# Patient Record
Sex: Female | Born: 1980
Health system: Southern US, Community
[De-identification: ages and names within clinical notes are randomized; demographics above are authoritative.]

## PROBLEM LIST (undated history)

## (undated) DIAGNOSIS — F419 Anxiety disorder, unspecified: Secondary | ICD-10-CM

## (undated) DIAGNOSIS — K589 Irritable bowel syndrome without diarrhea: Secondary | ICD-10-CM

## (undated) DIAGNOSIS — Z8489 Family history of other specified conditions: Secondary | ICD-10-CM

## (undated) DIAGNOSIS — M1990 Unspecified osteoarthritis, unspecified site: Secondary | ICD-10-CM

## (undated) DIAGNOSIS — R112 Nausea with vomiting, unspecified: Secondary | ICD-10-CM

## (undated) DIAGNOSIS — Z9889 Other specified postprocedural states: Secondary | ICD-10-CM

## (undated) DIAGNOSIS — R002 Palpitations: Secondary | ICD-10-CM

## (undated) HISTORY — PX: WISDOM TOOTH EXTRACTION: SHX21

## (undated) HISTORY — PX: KNEE ARTHROSCOPY W/ ACL RECONSTRUCTION: SHX1858

## (undated) HISTORY — PX: TONSILLECTOMY: SUR1361

## (undated) HISTORY — PX: ADENOIDECTOMY: SUR15

## (undated) HISTORY — PX: KNEE ARTHROSCOPY: SHX127

---

## 2002-08-16 ENCOUNTER — Other Ambulatory Visit: Admission: RE | Admit: 2002-08-16 | Discharge: 2002-08-16 | Payer: Self-pay | Admitting: Gynecology

## 2003-09-11 ENCOUNTER — Other Ambulatory Visit: Admission: RE | Admit: 2003-09-11 | Discharge: 2003-09-11 | Payer: Self-pay | Admitting: Gynecology

## 2004-09-11 ENCOUNTER — Other Ambulatory Visit: Admission: RE | Admit: 2004-09-11 | Discharge: 2004-09-11 | Payer: Self-pay | Admitting: Gynecology

## 2005-09-24 ENCOUNTER — Other Ambulatory Visit: Admission: RE | Admit: 2005-09-24 | Discharge: 2005-09-24 | Payer: Self-pay | Admitting: Gynecology

## 2006-10-06 ENCOUNTER — Other Ambulatory Visit: Admission: RE | Admit: 2006-10-06 | Discharge: 2006-10-06 | Payer: Self-pay | Admitting: Family Medicine

## 2007-11-07 ENCOUNTER — Other Ambulatory Visit: Admission: RE | Admit: 2007-11-07 | Discharge: 2007-11-07 | Payer: Self-pay | Admitting: Family Medicine

## 2008-11-11 ENCOUNTER — Other Ambulatory Visit: Admission: RE | Admit: 2008-11-11 | Discharge: 2008-11-11 | Payer: Self-pay | Admitting: Family Medicine

## 2010-01-15 ENCOUNTER — Other Ambulatory Visit: Admission: RE | Admit: 2010-01-15 | Discharge: 2010-01-15 | Payer: Self-pay | Admitting: Family Medicine

## 2013-09-28 ENCOUNTER — Ambulatory Visit (HOSPITAL_COMMUNITY)
Admission: RE | Admit: 2013-09-28 | Discharge: 2013-09-28 | Disposition: A | Discharge status: Home / Self Care | Payer: 59 | Source: Ambulatory Visit | Attending: Specialist | Admitting: Specialist

## 2013-09-28 ENCOUNTER — Other Ambulatory Visit (HOSPITAL_COMMUNITY): Payer: Self-pay | Admitting: Specialist

## 2013-09-28 DIAGNOSIS — M1711 Unilateral primary osteoarthritis, right knee: Secondary | ICD-10-CM

## 2013-09-28 DIAGNOSIS — M1710 Unilateral primary osteoarthritis, unspecified knee: Secondary | ICD-10-CM | POA: Insufficient documentation

## 2014-01-10 ENCOUNTER — Emergency Department (HOSPITAL_COMMUNITY)
Admission: EM | Admit: 2014-01-10 | Discharge: 2014-01-10 | Disposition: A | Discharge status: Home / Self Care | Payer: PRIVATE HEALTH INSURANCE | Attending: Emergency Medicine | Admitting: Emergency Medicine

## 2014-01-10 ENCOUNTER — Encounter (HOSPITAL_COMMUNITY): Payer: Self-pay | Admitting: *Deleted

## 2014-01-10 DIAGNOSIS — S01511A Laceration without foreign body of lip, initial encounter: Secondary | ICD-10-CM | POA: Diagnosis present

## 2014-01-10 DIAGNOSIS — Y9289 Other specified places as the place of occurrence of the external cause: Secondary | ICD-10-CM | POA: Diagnosis not present

## 2014-01-10 DIAGNOSIS — Z8659 Personal history of other mental and behavioral disorders: Secondary | ICD-10-CM | POA: Insufficient documentation

## 2014-01-10 DIAGNOSIS — Y998 Other external cause status: Secondary | ICD-10-CM | POA: Insufficient documentation

## 2014-01-10 DIAGNOSIS — Z8719 Personal history of other diseases of the digestive system: Secondary | ICD-10-CM | POA: Diagnosis not present

## 2014-01-10 DIAGNOSIS — Z8739 Personal history of other diseases of the musculoskeletal system and connective tissue: Secondary | ICD-10-CM | POA: Insufficient documentation

## 2014-01-10 DIAGNOSIS — Y9389 Activity, other specified: Secondary | ICD-10-CM | POA: Insufficient documentation

## 2014-01-10 DIAGNOSIS — S01531A Puncture wound without foreign body of lip, initial encounter: Secondary | ICD-10-CM | POA: Insufficient documentation

## 2014-01-10 HISTORY — DX: Unspecified osteoarthritis, unspecified site: M19.90

## 2014-01-10 HISTORY — DX: Irritable bowel syndrome, unspecified: K58.9

## 2014-01-10 HISTORY — DX: Anxiety disorder, unspecified: F41.9

## 2014-01-10 MED ORDER — LIDOCAINE-EPINEPHRINE-TETRACAINE (LET) SOLUTION
3.0000 mL | Freq: Once | NASAL | Status: AC
  Administered 2014-01-10: 3 mL via TOPICAL
  Filled 2014-01-10: qty 3

## 2014-01-10 MED ORDER — HYDROCODONE-ACETAMINOPHEN 5-325 MG PO TABS: 1.0000 | ORAL_TABLET | Freq: Four times a day (QID) | ORAL | Status: DC | PRN
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
## 2014-01-10 NOTE — ED Notes (Signed)
Pt tolerated procedure without difficulty; wound edges approximated; sutures intact; minimal swelling noted; pt discharged home with boyfriend

## 2014-01-10 NOTE — ED Notes (Signed)
Dr Gwendolyn GrantWalden in with patient suturing wound; pt boyfriend at bedside; pt tolerating procedure without difficulty
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
## 2014-01-10 NOTE — Discharge Instructions (Signed)
Mouth Laceration °A mouth laceration is a cut inside the mouth. °TREATMENT  °Because of all the bacteria in the mouth, lacerations are usually not stitched (sutured) unless the wound is gaping open. Sometimes, a couple sutures may be placed just to hold the edges of the wound together and to speed healing. Over the next 1 to 2 days, you will see that the wound edges appear gray in color. The edges may appear ragged and slightly spread apart. Because of all the normal bacteria in the mouth, these wounds are contaminated, but this is not an infection that needs antibiotics. Most wounds heal with no problems despite their appearance. °HOME CARE INSTRUCTIONS  °· Rinse your mouth with a warm, saltwater wash 4 to 6 times per day, or as your caregiver instructs. °· Continue oral hygiene and gentle tooth brushing as normal, if possible. °· Do not eat or drink hot food or beverages while your mouth is still numb. °· Eat a bland diet to avoid irritation from acidic foods. °· Only take over-the-counter or prescription medicines for pain, discomfort, or fever as directed by your caregiver. °· Follow up with your caregiver as instructed. You may need to see your caregiver for a wound check in 48 to 72 hours to make sure your wound is healing. °· If your laceration was sutured, do not play with the sutures or knots with your tongue. If you do this, they will gradually loosen and may become untied. °You may need a tetanus shot if: °· You cannot remember when you had your last tetanus shot. °· You have never had a tetanus shot. °If you get a tetanus shot, your arm may swell, get red, and feel warm to the touch. This is common and not a problem. If you need a tetanus shot and you choose not to have one, there is a rare chance of getting tetanus. Sickness from tetanus can be serious. °SEEK MEDICAL CARE IF:  °· You develop swelling or increasing pain in the wound or in other parts of your face. °· You have a fever. °· You develop  swollen, tender glands in the throat. °· You notice the wound edges do not stay together after your sutures have been removed. °· You see pus coming from the wound. Some drainage in the mouth is normal. °MAKE SURE YOU:  °· Understand these instructions. °· Will watch your condition. °· Will get help right away if you are not doing well or get worse. °Document Released: 02/15/2005 Document Revised: 05/10/2011 Document Reviewed: 08/20/2010 °ExitCare® Patient Information ©2015 [employer]. This information is not intended to replace advice given to you by your health care provider. Make sure you discuss any questions you have with your health care provider. ° °

## 2015-03-19 DIAGNOSIS — N898 Other specified noninflammatory disorders of vagina: Secondary | ICD-10-CM | POA: Diagnosis not present

## 2015-03-19 DIAGNOSIS — F410 Panic disorder [episodic paroxysmal anxiety] without agoraphobia: Secondary | ICD-10-CM | POA: Diagnosis not present

## 2015-03-19 DIAGNOSIS — Z79899 Other long term (current) drug therapy: Secondary | ICD-10-CM | POA: Diagnosis not present

## 2015-03-19 DIAGNOSIS — Z3041 Encounter for surveillance of contraceptive pills: Secondary | ICD-10-CM | POA: Diagnosis not present

## 2015-04-03 DIAGNOSIS — H5211 Myopia, right eye: Secondary | ICD-10-CM | POA: Diagnosis not present

## 2015-04-03 DIAGNOSIS — H52221 Regular astigmatism, right eye: Secondary | ICD-10-CM | POA: Diagnosis not present

## 2015-05-14 DIAGNOSIS — M1711 Unilateral primary osteoarthritis, right knee: Secondary | ICD-10-CM | POA: Diagnosis not present

## 2015-05-21 DIAGNOSIS — M1711 Unilateral primary osteoarthritis, right knee: Secondary | ICD-10-CM | POA: Diagnosis not present

## 2015-05-28 DIAGNOSIS — M1711 Unilateral primary osteoarthritis, right knee: Secondary | ICD-10-CM | POA: Diagnosis not present

## 2015-07-29 DIAGNOSIS — N3000 Acute cystitis without hematuria: Secondary | ICD-10-CM | POA: Diagnosis not present

## 2016-02-17 DIAGNOSIS — N898 Other specified noninflammatory disorders of vagina: Secondary | ICD-10-CM | POA: Diagnosis not present

## 2016-03-10 DIAGNOSIS — M1711 Unilateral primary osteoarthritis, right knee: Secondary | ICD-10-CM | POA: Diagnosis not present

## 2016-03-19 DIAGNOSIS — Z0000 Encounter for general adult medical examination without abnormal findings: Secondary | ICD-10-CM | POA: Diagnosis not present

## 2016-03-23 DIAGNOSIS — Z0000 Encounter for general adult medical examination without abnormal findings: Secondary | ICD-10-CM | POA: Diagnosis not present

## 2016-03-23 DIAGNOSIS — M1711 Unilateral primary osteoarthritis, right knee: Secondary | ICD-10-CM | POA: Diagnosis not present

## 2016-03-31 DIAGNOSIS — M1711 Unilateral primary osteoarthritis, right knee: Secondary | ICD-10-CM | POA: Diagnosis not present

## 2016-04-08 DIAGNOSIS — H52221 Regular astigmatism, right eye: Secondary | ICD-10-CM | POA: Diagnosis not present

## 2016-04-08 DIAGNOSIS — H5211 Myopia, right eye: Secondary | ICD-10-CM | POA: Diagnosis not present

## 2016-12-22 DIAGNOSIS — M1711 Unilateral primary osteoarthritis, right knee: Secondary | ICD-10-CM | POA: Diagnosis not present

## 2016-12-28 DIAGNOSIS — M1711 Unilateral primary osteoarthritis, right knee: Secondary | ICD-10-CM | POA: Diagnosis not present

## 2017-01-05 DIAGNOSIS — M1711 Unilateral primary osteoarthritis, right knee: Secondary | ICD-10-CM | POA: Diagnosis not present

## 2017-04-20 NOTE — Progress Notes (Signed)
[facility] [address] [place], [place] [address] [phone]  Date:  04/21/2017   Name:  April Graham   DOB:  10-25-1980   MRN:  [id number]  PCP:  Cheron Schaumann., MD    Chief Complaint: Establish Care (Pt here to est care. )   History of Present Illness:  April Graham is a 37 y.o. 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  Her parents do have a lot of chronic illness  Her father was dx with colon cancer at approx age 75.   In her free time she enjoys sports and traveling She likes to watch college football in the fall  She sees Dr. Thomasena Edis for her right knee issues  She needs a referral so she can continue to see him   [facility] is her eye doctor She is still on her OCP They are thinking about kids but have not decided for sure yet.   She has a DDS She does not see GYN  She is fasting today so we will get some labs for her She has been feeling pretty tired recently- we can check her thyroid and vitamin D for her, iron  She is taking phentermine through a weight loss clinic,they are also giving her B12 shots  She uses ativan on rare occasion for anxiety- this has worked well for her. She tried some other medications   Last pap a couple of years ago she thinks Would like a refill of her OCP  Relates a history of "fibrous cysts" in her breasts, and has had a mammogram in the past. Never any concern of cancer There are no active  problems to display for this patient.   Past Medical History:  Diagnosis Date  . Anxiety   . Arthritis   . Irritable bowel syndrome (IBS)     Past Surgical History:  Procedure Laterality Date  . ADENOIDECTOMY    . KNEE ARTHROSCOPY    . KNEE ARTHROSCOPY W/ ACL RECONSTRUCTION    . TONSILLECTOMY    . WISDOM TOOTH EXTRACTION      Social History   Tobacco Use  . Smoking status: Never Smoker  Substance Use Topics  . Alcohol use: Yes    Comment: social  . Drug use: Yes    No family history on file.  No Known Allergies  Medication list has been reviewed and updated.  Current Outpatient Medications on File Prior to Visit  Medication Sig Dispense Refill  . LORazepam (ATIVAN) 1 MG tablet Take 0.5 mg by mouth as needed.    . Norgestimate-Ethinyl Estradiol Triphasic 0.18/0.215/0.25 MG-35 MCG tablet TAKE 1 TABLET BY MOUTH DAILY.    Marland Kitchen phentermine 37.5 MG capsule Take 37.5 mg by mouth every morning.     No current facility-administered medications on file prior to visit.     Review of Systems:  As per HPI-  otherwise negative.   Physical Examination: Vitals:   04/21/17 0940  BP: 112/80  Pulse: 96  Temp: 98.9 F (37.2 C)  SpO2: 98%   Vitals:   04/21/17 0940  Weight: 228 lb 3.2 oz (103.5 kg)  Height: 6' (1.829 m)   Body mass index is 30.95 kg/m. Ideal Body Weight: Weight in (lb) to have BMI = 25: 183.9  GEN: WDWN, NAD, Non-toxic, A & O x 3, overweight, tall build HEENT: Atraumatic, Normocephalic. Neck supple. No masses, No LAD.  Bilateral TM wnl, oropharynx normal.  PEERL,EOMI.   Ears and Nose: No external deformity. CV: RRR, No M/G/R. No JVD. No thrill. No extra heart sounds. PULM: CTA B, no wheezes, crackles, rhonchi. No retractions. No resp. distress. No accessory muscle use. ABD: S, NT, ND, +BS. No rebound. No HSM. EXTR: No c/c/e NEURO Normal gait.  PSYCH: Normally interactive. Conversant. Not depressed or anxious appearing.  Calm demeanor.    Assessment  and Plan: Primary osteoarthritis of right knee - Plan: Ambulatory referral to Orthopedic Surgery  Screening for diabetes mellitus - Plan: Comprehensive metabolic panel, Hemoglobin A1c  Screening for hyperlipidemia - Plan: Lipid panel  Fatigue, unspecified type - Plan: CBC, TSH, Vitamin D (25 hydroxy)  Screening for deficiency anemia - Plan: CBC  Encounter for surveillance of contraceptive pills - Plan: Norgestimate-Ethinyl Estradiol Triphasic 0.18/0.215/0.25 MG-35 MCG tablet  Establishing care today Due for routine labs, will obtain as above She will come in for a pap and breast exam soon Refilled her OCP She sees Dr. Thomasena Edisollins routinely and needs a referral to him for insurance reasons  Signed Abbe AmsterdamJessica Nori Poland, MD  Received her labs - message to pt  Results for orders placed or performed in visit on 04/21/17  CBC  Result Value Ref Range   WBC 6.9 4.0 - 10.5 K/uL   RBC 3.96 3.87 - 5.11 Mil/uL   Platelets 227.0 150.0 - 400.0 K/uL   Hemoglobin 12.6 12.0 - 15.0 g/dL   HCT 45.437.8 09.836.0 - 11.946.0 %   MCV 95.6 78.0 - 100.0 fl   MCHC 33.4 30.0 - 36.0 g/dL   RDW 14.712.5 82.911.5 - 56.215.5 %  Comprehensive metabolic panel  Result Value Ref Range   Sodium 140 135 - 145 mEq/L   Potassium 4.6 3.5 - 5.1 mEq/L   Chloride 109 96 - 112 mEq/L   CO2 27 19 - 32 mEq/L   Glucose, Bld 87 70 - 99 mg/dL   BUN 13 6 - 23 mg/dL   Creatinine, Ser 1.300.81 0.40 - 1.20 mg/dL   Total Bilirubin 0.3 0.2 - 1.2 mg/dL   Alkaline Phosphatase 41 39 - 117 U/L   AST 15 0 - 37 U/L   ALT 12 0 - 35 U/L   Total Protein 6.5 6.0 - 8.3 g/dL   Albumin 3.7 3.5 - 5.2 g/dL   Calcium 9.0 8.4 - 86.510.5 mg/dL   GFR 78.4684.10 >96.29>60.00 mL/min  Hemoglobin A1c  Result Value Ref Range   Hgb A1c MFr Bld 5.0 4.6 - 6.5 %  Lipid panel  Result Value Ref Range   Cholesterol 115 0 - 200 mg/dL   Triglycerides 52.881.0 0.0 - 149.0 mg/dL   HDL 41.3255.50 >44.01>39.00 mg/dL   VLDL 02.716.2 0.0 - 25.340.0 mg/dL   LDL Cholesterol 44 0 - 99 mg/dL   Total CHOL/HDL Ratio 2     NonHDL 59.96   TSH  Result Value Ref Range   TSH 0.66 0.35 - 4.50 uIU/mL  Vitamin D (25  hydroxy)  Result Value Ref Range   VITD 44.66 30.00 - 100.00 ng/mL

## 2017-04-21 ENCOUNTER — Encounter: Payer: Self-pay | Admitting: Family Medicine

## 2017-04-21 ENCOUNTER — Ambulatory Visit (INDEPENDENT_AMBULATORY_CARE_PROVIDER_SITE_OTHER): Payer: No Typology Code available for payment source | Admitting: Family Medicine

## 2017-04-21 VITALS — BP 112/80 | HR 96 | Temp 98.9°F | Ht 72.0 in | Wt 228.2 lb

## 2017-04-21 DIAGNOSIS — Z3041 Encounter for surveillance of contraceptive pills: Secondary | ICD-10-CM

## 2017-04-21 DIAGNOSIS — Z13220 Encounter for screening for lipoid disorders: Secondary | ICD-10-CM

## 2017-04-21 DIAGNOSIS — Z130 Encounter for screening for diseases of the blood and blood-forming organs and certain disorders involving the immune mechanism: Secondary | ICD-10-CM | POA: Diagnosis not present

## 2017-04-21 DIAGNOSIS — Z131 Encounter for screening for diabetes mellitus: Secondary | ICD-10-CM | POA: Diagnosis not present

## 2017-04-21 DIAGNOSIS — M1711 Unilateral primary osteoarthritis, right knee: Secondary | ICD-10-CM

## 2017-04-21 DIAGNOSIS — R5383 Other fatigue: Secondary | ICD-10-CM | POA: Diagnosis not present

## 2017-04-21 LAB — COMPREHENSIVE METABOLIC PANEL
ALT: 12 U/L (ref 0–35)
AST: 15 U/L (ref 0–37)
Albumin: 3.7 g/dL (ref 3.5–5.2)
Alkaline Phosphatase: 41 U/L (ref 39–117)
BUN: 13 mg/dL (ref 6–23)
CALCIUM: 9 mg/dL (ref 8.4–10.5)
CHLORIDE: 109 meq/L (ref 96–112)
CO2: 27 meq/L (ref 19–32)
CREATININE: 0.81 mg/dL (ref 0.40–1.20)
GFR: 84.1 mL/min (ref >60.00)
Glucose, Bld: 87 mg/dL (ref 70–99)
POTASSIUM: 4.6 meq/L (ref 3.5–5.1)
Sodium: 140 mEq/L (ref 135–145)
Total Bilirubin: 0.3 mg/dL (ref 0.2–1.2)
Total Protein: 6.5 g/dL (ref 6.0–8.3)

## 2017-04-21 LAB — CBC
HEMATOCRIT: 37.8 % (ref 36.0–46.0)
Hemoglobin: 12.6 g/dL (ref 12.0–15.0)
MCHC: 33.4 g/dL (ref 30.0–36.0)
MCV: 95.6 fl (ref 78.0–100.0)
PLATELETS: 227 10*3/uL (ref 150.0–400.0)
RBC: 3.96 Mil/uL (ref 3.87–5.11)
RDW: 12.5 % (ref 11.5–15.5)
WBC: 6.9 10*3/uL (ref 4.0–10.5)

## 2017-04-21 LAB — HEMOGLOBIN A1C: Hgb A1c MFr Bld: 5 % (ref 4.6–6.5)

## 2017-04-21 LAB — LIPID PANEL
CHOLESTEROL: 115 mg/dL (ref 0–200)
HDL: 55.5 mg/dL (ref >39.00)
LDL CALC: 44 mg/dL (ref 0–99)
NonHDL: 59.96
TRIGLYCERIDES: 81 mg/dL (ref 0.0–149.0)
Total CHOL/HDL Ratio: 2
VLDL: 16.2 mg/dL (ref 0.0–40.0)

## 2017-04-21 LAB — VITAMIN D 25 HYDROXY (VIT D DEFICIENCY, FRACTURES): VITD: 44.66 ng/mL (ref 30.00–100.00)

## 2017-04-21 LAB — TSH: TSH: 0.66 u[IU]/mL (ref 0.35–4.50)

## 2017-04-21 MED ORDER — NORGESTIM-ETH ESTRAD TRIPHASIC 0.18/0.215/0.25 MG-35 MCG PO TABS: 1.0000 | ORAL_TABLET | Freq: Every day | ORAL | 4 refills | Status: DC

## 2017-04-21 NOTE — Patient Instructions (Signed)
It was nice to meet you today- please let me know if you continue to have any breast concerns and I will be glad to set up a mammo for you  I placed a referral to Dr. Thomasena Edisollins so you can schedule to see him at your convenience We will get labs for you today and I will be in touch with your results asap

## 2017-05-24 NOTE — Progress Notes (Addendum)
[facility] [address] [place], [place] [address] [phone]  Date:  05/25/2017   Name:  April Graham   DOB:  02/22/1981   MRN:  [id number]  PCP:  Pearline Cables, MD    Chief Complaint: Dysuria (c/o pain after urination x 1 week. ) and Medication Refill (Refill needed on LORazepam (ATIVAN))   History of Present Illness:  April Graham is a 37 y.o. 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  Her parents do have a lot of chronic illness Her father was dx with colon cancer at approx age 65.   In her free time she enjoys sports and traveling She likes to watch college football in the fall She sees Dr. Thomasena Edis for her right knee issues   NCCSR: no entries found  She has noted dysuria-burning- for about one week, this is getting worse.  She is going out of town soon and wanted to be checked for a UTI No itching, no vaginal discomfort No hematuria but her urine looks cloudy No fever or chills, no back pain or belly pain She does tend to have UTI and this seems like her typical sx   She does need a refill of her ativan- her pills are so old they seem to have changed in color She would like a referral for a diagnostic mammo for concern about her left breast She had had a couple of mammo in the past and been dx with cysts.    There are no active  problems to display for this patient.   Past Medical History:  Diagnosis Date  . Anxiety   . Arthritis   . Irritable bowel syndrome (IBS)     Past Surgical History:  Procedure Laterality Date  . ADENOIDECTOMY    . KNEE ARTHROSCOPY    . KNEE ARTHROSCOPY W/ ACL RECONSTRUCTION    . TONSILLECTOMY    . WISDOM TOOTH EXTRACTION      Social History   Tobacco Use  . Smoking status: Never Smoker  Substance Use Topics  . Alcohol use: Yes    Comment: social  . Drug use: Yes    Family History  Problem Relation Age of Onset  . Arthritis Mother   . Depression Mother   . Diabetes Mother   . High Cholesterol Mother   . Transient ischemic attack Mother   . Arthritis Father   . Cancer Father   . COPD Father   . Diabetes Father   . Hypertension Father   . Cancer Maternal Grandfather   . Cancer Paternal Grandmother     No Known Allergies  Medication list has been reviewed and updated.  Current Outpatient Medications on File Prior to Visit  Medication Sig Dispense Refill  . LORazepam (ATIVAN) 1 MG tablet Take  0.5 mg by mouth as needed.    . Norgestimate-Ethinyl Estradiol Triphasic 0.18/0.215/0.25 MG-35 MCG tablet Take 1 tablet by mouth daily. 3 Package 4  . phentermine 37.5 MG capsule Take 37.5 mg by mouth every morning.     No current facility-administered medications on file prior to visit.     Review of Systems:  As per HPI- otherwise negative.   Physical Examination: Vitals:   05/25/17 0828  BP: 112/76  Pulse: 90  Temp: 98 F (36.7 C)  SpO2: 98%   Vitals:   05/25/17 0828  Weight: 227 lb (103 kg)  Height: 6' (1.829 m)   Body mass index is 30.79 kg/m. Ideal Body Weight: Weight in (lb) to have BMI = 25: 183.9  GEN: WDWN, NAD, Non-toxic, A & O x 3,tall build, looks well  HEENT: Atraumatic, Normocephalic. Neck supple. No masses, No LAD. Bilateral TM wnl, oropharynx normal.  PEERL,EOMI.   Ears and Nose: No external deformity. CV: RRR, No M/G/R. No JVD. No  thrill. No extra heart sounds. PULM: CTA B, no wheezes, crackles, rhonchi. No retractions. No resp. distress. No accessory muscle use. ABD: S, minimal suprapubic TTP, ND, +BS. No rebound. No HSM. EXTR: No c/c/e NEURO Normal gait.  PSYCH: Normally interactive. Conversant. Not depressed or anxious appearing.  Calm demeanor.  Left breast- she has a large, smooth mass at 3:00, likely a cyst but certainly needs further eval No CVA tenderness   Results for orders placed or performed in visit on 05/25/17  POCT urinalysis dipstick  Result Value Ref Range   Color, UA yellow yellow   Clarity, UA cloudy (A) clear   Glucose, UA negative negative mg/dL   Bilirubin, UA negative negative   Ketones, POC UA negative negative mg/dL   Spec Grav, UA >=1.610 (A) 1.010 - 1.025   Blood, UA moderate (A) negative   pH, UA 6.0 5.0 - 8.0   Protein Ur, POC trace (A) negative mg/dL   Urobilinogen, UA 0.2 0.2 or 1.0 E.U./dL   Nitrite, UA Negative Negative   Leukocytes, UA Moderate (2+) (A) Negative    Assessment and Plan: Dysuria - Plan: Urine Culture, POCT urinalysis dipstick  Acute cystitis without hematuria - Plan: nitrofurantoin, macrocrystal-monohydrate, (MACROBID) 100 MG capsule  Mass of left breast - Plan: MM DIAG BREAST TOMO BILATERAL, US BREAST COMPLETE UNI LEFT INC AXILLA  Situational anxiety - Plan: LORazepam (ATIVAN) 1 MG tablet  A couple of issues today Treat for presumed UTI with macrobid while culture is pending Refilled her ativan which she uses on occasion Set up for diagnostic mammo and [place] left breast.  She has had a few cysts in the past which were benign so we hope this will be the same   Signed Abbe Amsterdam, MD  Received her urine culture 3/29 Results for orders placed or performed in visit on 05/25/17  Urine Culture  Result Value Ref Range   MICRO NUMBER: [id number]    SPECIMEN QUALITY: ADEQUATE    Sample Source URINE    STATUS: FINAL    ISOLATE 1: Escherichia coli (A)        Susceptibility   Escherichia coli - URINE CULTURE, REFLEX    AMOX/CLAVULANIC <=2 Sensitive     AMPICILLIN 8 Sensitive     AMPICILLIN/SULBACTAM 4 Sensitive     CEFAZOLIN* <=4 Not Reportable      * For infections other than uncomplicated UTIcaused by E. coli, K. pneumoniae or P. mirabilis:Cefazolin is resistant if MIC > or = 8  mcg/mL.(Distinguishing susceptible versus intermediatefor isolates with MIC < or = 4 mcg/mL requiresadditional testing.)For uncomplicated UTI caused by E. coli,K. pneumoniae or P. mirabilis: Cefazolin issusceptible if MIC <32 mcg/mL and predictssusceptible to the oral agents cefaclor, cefdinir,cefpodoxime, cefprozil, cefuroxime, cephalexinand loracarbef.    CEFEPIME <=1 Sensitive     CEFTRIAXONE <=1 Sensitive     CIPROFLOXACIN >=4 Resistant     LEVOFLOXACIN >=8 Resistant     ERTAPENEM <=0.5 Sensitive     GENTAMICIN <=1 Sensitive     IMIPENEM <=0.25 Sensitive     NITROFURANTOIN <=16 Sensitive     PIP/TAZO <=4 Sensitive     TOBRAMYCIN <=1 Sensitive     TRIMETH/SULFA* <=20 Sensitive      * For infections other than uncomplicated UTIcaused by E. coli, K. pneumoniae or P. mirabilis:Cefazolin is resistant if MIC > or = 8 mcg/mL.(Distinguishing susceptible versus intermediatefor isolates with MIC < or = 4 mcg/mL requiresadditional testing.)For uncomplicated UTI caused by E. coli,K. pneumoniae or P. mirabilis: Cefazolin issusceptible if MIC <32 mcg/mL and predictssusceptible to the oral agents cefaclor, cefdinir,cefpodoxime, cefprozil, cefuroxime, cephalexinand loracarbef.Legend:S = Susceptible  I = IntermediateR = Resistant  NS = Not susceptible* = Not tested  NR = Not reported**NN = See antimicrobic comments  POCT urinalysis dipstick  Result Value Ref Range   Color, UA yellow yellow   Clarity, UA cloudy (A) clear   Glucose, UA negative negative mg/dL   Bilirubin, UA negative negative   Ketones, POC UA negative negative mg/dL   Spec Grav, UA >=0.454>=1.030 (A) 1.010 - 1.025    Blood, UA moderate (A) negative   pH, UA 6.0 5.0 - 8.0   Protein Ur, POC trace (A) negative mg/dL   Urobilinogen, UA 0.2 0.2 or 1.0 E.U./dL   Nitrite, UA Negative Negative   Leukocytes, UA Moderate (2+) (A) Negative   macrobid should be effective- message to pt

## 2017-05-25 ENCOUNTER — Encounter: Payer: Self-pay | Admitting: Family Medicine

## 2017-05-25 ENCOUNTER — Ambulatory Visit (INDEPENDENT_AMBULATORY_CARE_PROVIDER_SITE_OTHER): Payer: No Typology Code available for payment source | Admitting: Family Medicine

## 2017-05-25 VITALS — BP 112/76 | HR 90 | Temp 98.0°F | Ht 72.0 in | Wt 227.0 lb

## 2017-05-25 DIAGNOSIS — N3000 Acute cystitis without hematuria: Secondary | ICD-10-CM

## 2017-05-25 DIAGNOSIS — F418 Other specified anxiety disorders: Secondary | ICD-10-CM | POA: Diagnosis not present

## 2017-05-25 DIAGNOSIS — R300 Dysuria: Secondary | ICD-10-CM

## 2017-05-25 DIAGNOSIS — N6320 Unspecified lump in the left breast, unspecified quadrant: Secondary | ICD-10-CM | POA: Diagnosis not present

## 2017-05-25 LAB — POCT URINALYSIS DIP (MANUAL ENTRY)
Bilirubin, UA: NEGATIVE
Glucose, UA: NEGATIVE mg/dL
Ketones, POC UA: NEGATIVE mg/dL
NITRITE UA: NEGATIVE
PH UA: 6 (ref 5.0–8.0)
Spec Grav, UA: >=1.03 — AB (ref 1.010–1.025)
UROBILINOGEN UA: 0.2 U/dL

## 2017-05-25 MED ORDER — LORAZEPAM 1 MG PO TABS: 0.5000 mg | ORAL_TABLET | ORAL | 0 refills | Status: DC | PRN

## 2017-05-25 MED ORDER — NITROFURANTOIN MONOHYD MACRO 100 MG PO CAPS: 100.0000 mg | ORAL_CAPSULE | Freq: Two times a day (BID) | ORAL | 0 refills | Status: DC

## 2017-05-25 NOTE — Patient Instructions (Signed)
Good to see you today- I hope that you mom's shoulder operation goes well! We will culture your urine and be in touch with results. In the meantime please start on macrobid twice a day. Let me know if you do not see improvement in the next 1-2 days I refilled your ativan- continue to use sparingly and do not use wen you need to drive We are arranging a diagnostic mammogram for you. Please alert me if you don't hear about this appt in the next few days

## 2017-05-27 ENCOUNTER — Encounter: Payer: Self-pay | Admitting: Family Medicine

## 2017-05-27 LAB — URINE CULTURE
MICRO NUMBER:: 90382273
SPECIMEN QUALITY:: ADEQUATE

## 2017-05-29 NOTE — Progress Notes (Signed)
[facility] [address], [address] [place], [place] [address] [phone]  Date:  05/30/2017   Name:  April Graham   DOB:  1980/11/23   MRN:  [id number]  PCP:  Pearline Cables, MD    Chief Complaint: Annual Exam (Pt here for CPE with PAP. )   History of Present Illness:  April Graham is a 37 y.o. 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  Her parents do have a lot of chronic illness Her father was dx with colon cancer at approx age 35.  In her free time she enjoys sports and traveling She likes to watch college football in the fall She sees Dr. Thomasena Edis for her right knee issues   Labs: done in Feb of his year Pap: last 2-3 years ago.  Never had an abnormal, will update today Tdap: 2010  Flu: UTD  Her recent urine culture did come back positive for E coli sensitive to macrobid which we used to treat her. 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  She plans to have a mammogram done soon as discussed on her recent visit - she will let me know if she does not hear about this appt soon  She is on her OCP. She and  her husband have thought about having a baby but have not yet made a move in this direction. They are still considering this idea. Her menses are light and regular with OCP  She does try to exercise a few times a week and tries to eat a healthy diet  There are no active problems to display for this patient.   Past Medical History:  Diagnosis Date  . Anxiety   . Arthritis   . Irritable bowel syndrome (IBS)     Past Surgical History:  Procedure Laterality Date  . ADENOIDECTOMY    . KNEE ARTHROSCOPY    . KNEE ARTHROSCOPY W/ ACL RECONSTRUCTION    . TONSILLECTOMY    . WISDOM TOOTH EXTRACTION      Social History   Tobacco Use  . Smoking status: Never Smoker  Substance Use Topics  . Alcohol use: Yes    Comment: social  . Drug use: Yes    Family History  Problem Relation Age of Onset  . Arthritis Mother   . Depression Mother   . Diabetes Mother   . High Cholesterol Mother   . Transient ischemic attack Mother   . Arthritis Father   . Cancer Father   .  COPD Father   . Diabetes Father   . Hypertension Father   . Cancer Maternal Grandfather   . Cancer Paternal Grandmother     No Known Allergies  Medication list has been reviewed and updated.  Current Outpatient Medications on File Prior to Visit  Medication Sig Dispense Refill  . LORazepam (ATIVAN) 1 MG tablet Take 0.5 tablets (0.5 mg total) by mouth as needed. May take twice daily for occasional anxiety 30 tablet 0  . nitrofurantoin, macrocrystal-monohydrate, (MACROBID) 100 MG capsule Take 1 capsule (100 mg total) by mouth 2 (two) times daily. 14 capsule 0  . Norgestimate-Ethinyl Estradiol Triphasic 0.18/0.215/0.25 MG-35 MCG tablet Take 1 tablet by mouth daily. 3 Package 4  . phentermine 37.5 MG capsule Take 37.5 mg by mouth every morning.     No current facility-administered medications on file prior to visit.     Review of Systems:  As per HPI- otherwise negative. No CP or SOB Breast mass- mammo and US are  pending    Physical Examination: Vitals:   05/30/17 0924  BP: 110/72  Pulse: 88  SpO2: 98%   Vitals:   05/30/17 0924  Weight: 227 lb (103 kg)   Body mass index is 30.79 kg/m. Ideal Body Weight:    GEN: WDWN, NAD, Non-toxic, A & O x 3, tall build, overweight, looks well  HEENT: Atraumatic, Normocephalic. Neck supple. No masses, No LAD.  Bilateral TM wnl, oropharynx normal.  PEERL,EOMI.   Ears and Nose: No external deformity. CV: RRR, No M/G/R. No JVD. No thrill. No extra heart sounds. PULM: CTA B, no wheezes, crackles, rhonchi. No retractions. No resp. distress. No accessory muscle use. ABD: S, NT, ND. No rebound. No HSM. EXTR: No c/c/e NEURO Normal gait.  PSYCH: Normally interactive. Conversant. Not depressed or anxious appearing.  Calm demeanor.  Breast: defer, did recently  Pelvic: normal, no vaginal lesions or discharge. Uterus normal, no CMT, no adnexal tendereness or masses    Assessment and Plan: Physical exam  Screening for cervical cancer - Plan: Cytology - PAP  CPE today Pap pending Her labs are otherwise UTD Further eval for breast mass noted by pt is pending   Signed Abbe AmsterdamJessica Josephus Harriger, MD

## 2017-05-30 ENCOUNTER — Encounter: Payer: Self-pay | Admitting: Family Medicine

## 2017-05-30 ENCOUNTER — Other Ambulatory Visit (HOSPITAL_COMMUNITY)
Admission: RE | Admit: 2017-05-30 | Discharge: 2017-05-30 | Disposition: A | Discharge status: Home / Self Care | Payer: No Typology Code available for payment source | Source: Ambulatory Visit | Attending: Family Medicine | Admitting: Family Medicine

## 2017-05-30 ENCOUNTER — Ambulatory Visit (INDEPENDENT_AMBULATORY_CARE_PROVIDER_SITE_OTHER): Payer: No Typology Code available for payment source | Admitting: Family Medicine

## 2017-05-30 VITALS — BP 110/72 | HR 88 | Wt 227.0 lb

## 2017-05-30 DIAGNOSIS — Z124 Encounter for screening for malignant neoplasm of cervix: Secondary | ICD-10-CM

## 2017-05-30 DIAGNOSIS — Z0000 Encounter for general adult medical examination without abnormal findings: Secondary | ICD-10-CM | POA: Diagnosis not present

## 2017-05-30 NOTE — Patient Instructions (Signed)
Great to see you again today-  I will be in touch with your pap Please let me know if you don't get your mammogram set up as expected  Health Maintenance, Female Adopting a healthy lifestyle and getting preventive care can go a long way to promote health and wellness. Talk with your health care provider about what schedule of regular examinations is right for you. This is a good chance for you to check in with your provider about disease prevention and staying healthy. In between checkups, there are plenty of things you can do on your own. Experts have done a lot of research about which lifestyle changes and preventive measures are most likely to keep you healthy. Ask your health care provider for more information. Weight and diet Eat a healthy diet  Be sure to include plenty of vegetables, fruits, low-fat dairy products, and lean protein.  Do not eat a lot of foods high in solid fats, added sugars, or salt.  Get regular exercise. This is one of the most important things you can do for your health. ? Most adults should exercise for at least 150 minutes each week. The exercise should increase your heart rate and make you sweat (moderate-intensity exercise). ? Most adults should also do strengthening exercises at least twice a week. This is in addition to the moderate-intensity exercise.  Maintain a healthy weight  Body mass index (BMI) is a measurement that can be used to identify possible weight problems. It estimates body fat based on height and weight. Your health care provider can help determine your BMI and help you achieve or maintain a healthy weight.  For females 47 years of age and older: ? A BMI below 18.5 is considered underweight. ? A BMI of 18.5 to 24.9 is normal. ? A BMI of 25 to 29.9 is considered overweight. ? A BMI of 30 and above is considered obese.  Watch levels of cholesterol and blood lipids  You should start having your blood tested for lipids and cholesterol at 38  years of age, then have this test every 5 years.  You may need to have your cholesterol levels checked more often if: ? Your lipid or cholesterol levels are high. ? You are older than 38 years of age. ? You are at high risk for heart disease.  Cancer screening Lung Cancer  Lung cancer screening is recommended for adults 21-33 years old who are at high risk for lung cancer because of a history of smoking.  A yearly low-dose CT scan of the lungs is recommended for people who: ? Currently smoke. ? Have quit within the past 15 years. ? Have at least a 30-pack-year history of smoking. A pack year is smoking an average of one pack of cigarettes a day for 1 year.  Yearly screening should continue until it has been 15 years since you quit.  Yearly screening should stop if you develop a health problem that would prevent you from having lung cancer treatment.  Breast Cancer  Practice breast self-awareness. This means understanding how your breasts normally appear and feel.  It also means doing regular breast self-exams. Let your health care provider know about any changes, no matter how small.  If you are in your 20s or 30s, you should have a clinical breast exam (CBE) by a health care provider every 1-3 years as part of a regular health exam.  If you are 79 or older, have a CBE every year. Also consider having a breast  X-ray (mammogram) every year.  If you have a family history of breast cancer, talk to your health care provider about genetic screening.  If you are at high risk for breast cancer, talk to your health care provider about having an MRI and a mammogram every year.  Breast cancer gene (BRCA) assessment is recommended for women who have family members with BRCA-related cancers. BRCA-related cancers include: ? Breast. ? Ovarian. ? Tubal. ? Peritoneal cancers.  Results of the assessment will determine the need for genetic counseling and BRCA1 and BRCA2 testing.  Cervical  Cancer Your health care provider may recommend that you be screened regularly for cancer of the pelvic organs (ovaries, uterus, and vagina). This screening involves a pelvic examination, including checking for microscopic changes to the surface of your cervix (Pap test). You may be encouraged to have this screening done every 3 years, beginning at age 78.  For women ages 57-65, health care providers may recommend pelvic exams and Pap testing every 3 years, or they may recommend the Pap and pelvic exam, combined with testing for human papilloma virus (HPV), every 5 years. Some types of HPV increase your risk of cervical cancer. Testing for HPV may also be done on women of any age with unclear Pap test results.  Other health care providers may not recommend any screening for nonpregnant women who are considered low risk for pelvic cancer and who do not have symptoms. Ask your health care provider if a screening pelvic exam is right for you.  If you have had past treatment for cervical cancer or a condition that could lead to cancer, you need Pap tests and screening for cancer for at least 20 years after your treatment. If Pap tests have been discontinued, your risk factors (such as having a new sexual partner) need to be reassessed to determine if screening should resume. Some women have medical problems that increase the chance of getting cervical cancer. In these cases, your health care provider may recommend more frequent screening and Pap tests.  Colorectal Cancer  This type of cancer can be detected and often prevented.  Routine colorectal cancer screening usually begins at 38 years of age and continues through 38 years of age.  Your health care provider may recommend screening at an earlier age if you have risk factors for colon cancer.  Your health care provider may also recommend using home test kits to check for hidden blood in the stool.  A small camera at the end of a tube can be used to  examine your colon directly (sigmoidoscopy or colonoscopy). This is done to check for the earliest forms of colorectal cancer.  Routine screening usually begins at age 36.  Direct examination of the colon should be repeated every 5-10 years through 38 years of age. However, you may need to be screened more often if early forms of precancerous polyps or small growths are found.  Skin Cancer  Check your skin from head to toe regularly.  Tell your health care provider about any new moles or changes in moles, especially if there is a change in a mole's shape or color.  Also tell your health care provider if you have a mole that is larger than the size of a pencil eraser.  Always use sunscreen. Apply sunscreen liberally and repeatedly throughout the day.  Protect yourself by wearing long sleeves, pants, a wide-brimmed hat, and sunglasses whenever you are outside.  Heart disease, diabetes, and high blood pressure  High blood  pressure causes heart disease and increases the risk of stroke. High blood pressure is more likely to develop in: ? People who have blood pressure in the high end of the normal range (130-139/85-89 mm Hg). ? People who are overweight or obese. ? People who are African American.  If you are 73-13 years of age, have your blood pressure checked every 3-5 years. If you are 25 years of age or older, have your blood pressure checked every year. You should have your blood pressure measured twice-once when you are at a hospital or clinic, and once when you are not at a hospital or clinic. Record the average of the two measurements. To check your blood pressure when you are not at a hospital or clinic, you can use: ? An automated blood pressure machine at a pharmacy. ? A home blood pressure monitor.  If you are between 32 years and 64 years old, ask your health care provider if you should take aspirin to prevent strokes.  Have regular diabetes screenings. This involves taking a  blood sample to check your fasting blood sugar level. ? If you are at a normal weight and have a low risk for diabetes, have this test once every three years after 38 years of age. ? If you are overweight and have a high risk for diabetes, consider being tested at a younger age or more often. Preventing infection Hepatitis B  If you have a higher risk for hepatitis B, you should be screened for this virus. You are considered at high risk for hepatitis B if: ? You were born in a country where hepatitis B is common. Ask your health care provider which countries are considered high risk. ? Your parents were born in a high-risk country, and you have not been immunized against hepatitis B (hepatitis B vaccine). ? You have HIV or AIDS. ? You use needles to inject street drugs. ? You live with someone who has hepatitis B. ? You have had sex with someone who has hepatitis B. ? You get hemodialysis treatment. ? You take certain medicines for conditions, including cancer, organ transplantation, and autoimmune conditions.  Hepatitis C  Blood testing is recommended for: ? Everyone born from [birth year] through 1966. ? Anyone with known risk factors for hepatitis C.  Sexually transmitted infections (STIs)  You should be screened for sexually transmitted infections (STIs) including gonorrhea and chlamydia if: ? You are sexually active and are younger than 38 years of age. ? You are older than 38 years of age and your health care provider tells you that you are at risk for this type of infection. ? Your sexual activity has changed since you were last screened and you are at an increased risk for chlamydia or gonorrhea. Ask your health care provider if you are at risk.  If you do not have HIV, but are at risk, it may be recommended that you take a prescription medicine daily to prevent HIV infection. This is called pre-exposure prophylaxis (PrEP). You are considered at risk if: ? You are sexually active and  do not regularly use condoms or know the HIV status of your partner(s). ? You take drugs by injection. ? You are sexually active with a partner who has HIV.  Talk with your health care provider about whether you are at high risk of being infected with HIV. If you choose to begin PrEP, you should first be tested for HIV. You should then be tested every 3 months for as  long as you are taking PrEP. Pregnancy  If you are premenopausal and you may become pregnant, ask your health care provider about preconception counseling.  If you may become pregnant, take 400 to 800 micrograms (mcg) of folic acid every day.  If you want to prevent pregnancy, talk to your health care provider about birth control (contraception). Osteoporosis and menopause  Osteoporosis is a disease in which the bones lose minerals and strength with aging. This can result in serious bone fractures. Your risk for osteoporosis can be identified using a bone density scan.  If you are 59 years of age or older, or if you are at risk for osteoporosis and fractures, ask your health care provider if you should be screened.  Ask your health care provider whether you should take a calcium or vitamin D supplement to lower your risk for osteoporosis.  Menopause may have certain physical symptoms and risks.  Hormone replacement therapy may reduce some of these symptoms and risks. Talk to your health care provider about whether hormone replacement therapy is right for you. Follow these instructions at home:  Schedule regular health, dental, and eye exams.  Stay current with your immunizations.  Do not use any tobacco products including cigarettes, chewing tobacco, or electronic cigarettes.  If you are pregnant, do not drink alcohol.  If you are breastfeeding, limit how much and how often you drink alcohol.  Limit alcohol intake to no more than 1 drink per day for nonpregnant women. One drink equals 12 ounces of beer, 5 ounces of  wine, or 1 ounces of hard liquor.  Do not use street drugs.  Do not share needles.  Ask your health care provider for help if you need support or information about quitting drugs.  Tell your health care provider if you often feel depressed.  Tell your health care provider if you have ever been abused or do not feel safe at home. This information is not intended to replace advice given to you by your health care provider. Make sure you discuss any questions you have with your health care provider. Document Released: 08/31/2010 Document Revised: 07/24/2015 Document Reviewed: 11/19/2014 Elsevier Interactive Patient Education  [employer].

## 2017-05-31 ENCOUNTER — Encounter: Payer: Self-pay | Admitting: Family Medicine

## 2017-05-31 LAB — CYTOLOGY - PAP
Adequacy: ABSENT
Diagnosis: NEGATIVE
HPV (WINDOPATH): NOT DETECTED

## 2017-05-31 MED ORDER — FLUCONAZOLE 150 MG PO TABS: 150.0000 mg | ORAL_TABLET | Freq: Once | ORAL | 0 refills | Status: AC

## 2017-06-06 ENCOUNTER — Ambulatory Visit
Admission: RE | Admit: 2017-06-06 | Discharge: 2017-06-06 | Disposition: A | Discharge status: Home / Self Care | Payer: No Typology Code available for payment source | Source: Ambulatory Visit | Attending: Family Medicine | Admitting: Family Medicine

## 2017-06-06 DIAGNOSIS — N6320 Unspecified lump in the left breast, unspecified quadrant: Secondary | ICD-10-CM

## 2017-09-20 ENCOUNTER — Telehealth: Payer: No Typology Code available for payment source | Admitting: Family

## 2017-09-20 DIAGNOSIS — N760 Acute vaginitis: Secondary | ICD-10-CM | POA: Diagnosis not present

## 2017-09-20 MED ORDER — METRONIDAZOLE 500 MG PO TABS: 500.0000 mg | ORAL_TABLET | Freq: Two times a day (BID) | ORAL | 0 refills | Status: DC

## 2017-09-20 NOTE — Progress Notes (Signed)

## 2017-10-27 ENCOUNTER — Encounter: Payer: Self-pay | Admitting: Family Medicine

## 2017-10-27 ENCOUNTER — Telehealth: Payer: Self-pay

## 2017-10-27 DIAGNOSIS — M25561 Pain in right knee: Principal | ICD-10-CM

## 2017-10-27 DIAGNOSIS — M25562 Pain in left knee: Principal | ICD-10-CM

## 2017-10-27 DIAGNOSIS — G8929 Other chronic pain: Secondary | ICD-10-CM

## 2017-10-27 NOTE — Addendum Note (Signed)
Addended by: Abbe AmsterdamOPLAND, Ersa Delaney C on: 10/27/2017 03:15 PM   Modules accepted: Orders

## 2017-10-27 NOTE — Telephone Encounter (Signed)
Copied from CRM [id number]. Topic: Referral - Request >> Oct 27, 2017 12:10 PM Trula SladeWalter, Linda F wrote: Reason for CRM:  Patient would like a referral for knee injections.  It's a series of three injections (for lubrication of the knee joint and it numbs the bone) at least twice a year.  [phone].  This appt will be with Dr. Valma CavaAndrew Collins and the appt is 11/01/17 at 2:15pm.  Please contact the facility about the referral.

## 2017-10-28 NOTE — Telephone Encounter (Signed)
[facility] [place] states they received a referral for our office. The referral was sent to them instead of Dr. Thomasena Edisollins office . Please send to correct office.

## 2017-11-01 ENCOUNTER — Other Ambulatory Visit: Payer: Self-pay | Admitting: Family Medicine

## 2017-11-01 DIAGNOSIS — G8929 Other chronic pain: Secondary | ICD-10-CM

## 2017-11-01 DIAGNOSIS — M25562 Pain in left knee: Principal | ICD-10-CM

## 2017-11-01 DIAGNOSIS — M25561 Pain in right knee: Principal | ICD-10-CM

## 2017-11-01 NOTE — Telephone Encounter (Signed)
Took care of this and called [facility] to make sure her appt was secure

## 2017-11-01 NOTE — Progress Notes (Signed)
Ok- I am not sure how this referral ended up being sent to the [facility]. I re-did referral now

## 2017-12-22 ENCOUNTER — Encounter: Payer: Self-pay | Admitting: Medical

## 2017-12-22 ENCOUNTER — Other Ambulatory Visit (HOSPITAL_COMMUNITY)
Admission: RE | Admit: 2017-12-22 | Discharge: 2017-12-22 | Disposition: A | Discharge status: Home / Self Care | Payer: No Typology Code available for payment source | Source: Ambulatory Visit | Attending: Medical | Admitting: Medical

## 2017-12-22 ENCOUNTER — Ambulatory Visit (INDEPENDENT_AMBULATORY_CARE_PROVIDER_SITE_OTHER): Payer: No Typology Code available for payment source | Admitting: Medical

## 2017-12-22 VITALS — BP 129/99 | HR 84 | Temp 98.8°F | Resp 16 | Ht 72.0 in | Wt 217.6 lb

## 2017-12-22 DIAGNOSIS — N898 Other specified noninflammatory disorders of vagina: Secondary | ICD-10-CM | POA: Diagnosis not present

## 2017-12-22 DIAGNOSIS — Z113 Encounter for screening for infections with a predominantly sexual mode of transmission: Secondary | ICD-10-CM | POA: Insufficient documentation

## 2017-12-22 DIAGNOSIS — R319 Hematuria, unspecified: Secondary | ICD-10-CM | POA: Diagnosis not present

## 2017-12-22 DIAGNOSIS — N760 Acute vaginitis: Secondary | ICD-10-CM | POA: Insufficient documentation

## 2017-12-22 DIAGNOSIS — B9689 Other specified bacterial agents as the cause of diseases classified elsewhere: Secondary | ICD-10-CM | POA: Insufficient documentation

## 2017-12-22 LAB — POC URINALSYSI DIPSTICK (AUTOMATED)
Bilirubin, UA: NEGATIVE
GLUCOSE UA: NEGATIVE
Ketones, UA: NEGATIVE
Leukocytes, UA: NEGATIVE
NITRITE UA: NEGATIVE
PROTEIN UA: NEGATIVE
SPEC GRAV UA: 1.025 (ref 1.010–1.025)
UROBILINOGEN UA: NEGATIVE U/dL — AB
pH, UA: 6 (ref 5.0–8.0)

## 2017-12-22 MED ORDER — METRONIDAZOLE 500 MG PO TABS
500.0000 mg | ORAL_TABLET | Freq: Three times a day (TID) | ORAL | 0 refills | Status: DC
Start: 2017-12-22 — End: 2018-08-30

## 2017-12-22 NOTE — Patient Instructions (Signed)
For recent vaginal discharge with odor as well as history of BV, I did send in a prescription of Flagyl.  I did order a urine ancillary studies and these would include gonorrhea, chlamydia, trichomonas, BV and Candida.  In addition we will get screening HIV and RPR.  Please start Flagyl today and will notify you of results when those are in.  If you get worsening or changing symptoms let us know.  Urinalysis showed trace blood but otherwise are clear.  Decided not to do culture.  Follow-up in 7 to 10 days or as needed.

## 2017-12-22 NOTE — Progress Notes (Signed)
Subjective:    Patient ID: April Graham, female    DOB: 12-11-1980, 36 y.o.   MRN: [id number]  HPI  Pt in for first time.    Pt in for recent vaginal discharge. She states hx of bv in the past. She states has watery faint tannish dc with odor. Pt states 3 times over years with bv.  Pt states earlier this year her estranged husband cheated on her. Pt was intimate with him in summer but not recently. No lesions, no sore area, no adnexal pain, or thick discharge.   No pain on urination. No cva pain.  LMP- last Thursday.   Review of Systems  Constitutional: Negative for chills, fatigue and fever.  Respiratory: Negative for cough, chest tightness, shortness of breath and wheezing.   Cardiovascular: Negative for chest pain and palpitations.  Gastrointestinal: Negative for abdominal pain, constipation and diarrhea.  Genitourinary: Negative for difficulty urinating, dysuria, flank pain, genital sores, pelvic pain and urgency.       Vag dc and odor.  Musculoskeletal: Negative for back pain and gait problem.  Neurological: Negative for dizziness, seizures, syncope, weakness and headaches.  Hematological: Negative for adenopathy. Does not bruise/bleed easily.  Psychiatric/Behavioral: Negative for behavioral problems and confusion.    Past Medical History:  Diagnosis Date  . Anxiety   . Arthritis   . Irritable bowel syndrome (IBS)      Social History   Socioeconomic History  . Marital status: Married    Spouse name: Not on file  . Number of children: Not on file  . Years of education: Not on file  . Highest education level: Not on file  Occupational History  . Not on file  Social Needs  . Financial resource strain: Not on file  . Food insecurity:    Worry: Not on file    Inability: Not on file  . Transportation needs:    Medical: Not on file    Non-medical: Not on file  Tobacco Use  . Smoking status: Never Smoker  . Smokeless tobacco: Never Used  Substance and  Sexual Activity  . Alcohol use: Yes    Comment: social  . Drug use: Yes  . Sexual activity: Yes    Birth control/protection: Pill  Lifestyle  . Physical activity:    Days per week: Not on file    Minutes per session: Not on file  . Stress: Not on file  Relationships  . Social connections:    Talks on phone: Not on file    Gets together: Not on file    Attends religious service: Not on file    Active member of club or organization: Not on file    Attends meetings of clubs or organizations: Not on file    Relationship status: Not on file  . Intimate partner violence:    Fear of current or ex partner: Not on file    Emotionally abused: Not on file    Physically abused: Not on file    Forced sexual activity: Not on file  Other Topics Concern  . Not on file  Social History Narrative  . Not on file    Past Surgical History:  Procedure Laterality Date  . ADENOIDECTOMY    . KNEE ARTHROSCOPY    . KNEE ARTHROSCOPY W/ ACL RECONSTRUCTION    . TONSILLECTOMY    . WISDOM TOOTH EXTRACTION      Family History  Problem Relation Age of Onset  . Arthritis Mother   .  Depression Mother   . Diabetes Mother   . High Cholesterol Mother   . Transient ischemic attack Mother   . Arthritis Father   . Cancer Father   . COPD Father   . Diabetes Father   . Hypertension Father   . Cancer Maternal Grandfather   . Cancer Paternal Grandmother     No Known Allergies  Current Outpatient Medications on File Prior to Visit  Medication Sig Dispense Refill  . LORazepam (ATIVAN) 1 MG tablet Take 0.5 tablets (0.5 mg total) by mouth as needed. May take twice daily for occasional anxiety 30 tablet 0  . Norgestimate-Ethinyl Estradiol Triphasic 0.18/0.215/0.25 MG-35 MCG tablet Take 1 tablet by mouth daily. 3 Package 4   No current facility-administered medications on file prior to visit.     BP (!) 129/99   Pulse 84   Temp 98.8 F (37.1 C) (Oral)   Resp 16   Ht 6' (1.829 m)   Wt 217 lb 9.6 oz  (98.7 kg)   SpO2 99%   BMI 29.51 kg/m       Objective:   Physical Exam  General- No acute distress. Pleasant patient. Neck- Full range of motion, no jvd Lungs- Clear, even and unlabored. Heart- regular rate and rhythm. Neurologic- CNII- XII grossly intact.  Abdomen- soft, non-tender, non-distended. +bs. No rebound or guarding.   Back- no cva tenderness.      Assessment & Plan:  For recent vaginal discharge with odor as well as history of BV, I did send in a prescription of Flagyl.  I did order a urine ancillary studies and these would include gonorrhea, chlamydia, trichomonas, BV and Candida.  In addition we will get screening HIV and RPR.(note at this point pt expresses better ancillary and pelvic not needed. If symptoms worsen or change may need pelvic)  Please start Flagyl today and will notify you of results when those are in.  If you get worsening or changing symptoms let us know.  Urinalysis showed trace blood but otherwise are clear.  Decided not to do culture.  Follow-up in 7 to 10 days or as needed.  Esperanza Richters, PA-C

## 2017-12-23 LAB — URINE CYTOLOGY ANCILLARY ONLY
Chlamydia: NEGATIVE
Neisseria Gonorrhea: NEGATIVE
Trichomonas: NEGATIVE

## 2017-12-23 LAB — RPR: RPR: NONREACTIVE

## 2017-12-23 LAB — HIV ANTIBODY (ROUTINE TESTING W REFLEX): HIV: NONREACTIVE

## 2017-12-24 LAB — URINE CYTOLOGY ANCILLARY ONLY: CANDIDA VAGINITIS: NEGATIVE

## 2018-05-03 ENCOUNTER — Other Ambulatory Visit: Payer: Self-pay | Admitting: Family Medicine

## 2018-05-03 ENCOUNTER — Encounter: Payer: Self-pay | Admitting: Family Medicine

## 2018-05-03 DIAGNOSIS — Z3041 Encounter for surveillance of contraceptive pills: Secondary | ICD-10-CM

## 2018-05-03 MED ORDER — NORGESTIM-ETH ESTRAD TRIPHASIC 0.18/0.215/0.25 MG-35 MCG PO TABS: 1.0000 | ORAL_TABLET | Freq: Every day | ORAL | 3 refills | Status: DC

## 2018-05-31 ENCOUNTER — Telehealth: Payer: No Typology Code available for payment source | Admitting: Nurse Practitioner

## 2018-05-31 DIAGNOSIS — N3000 Acute cystitis without hematuria: Secondary | ICD-10-CM

## 2018-05-31 MED ORDER — NITROFURANTOIN MONOHYD MACRO 100 MG PO CAPS: 100.0000 mg | ORAL_CAPSULE | Freq: Two times a day (BID) | ORAL | 0 refills | Status: DC

## 2018-05-31 NOTE — Progress Notes (Signed)

## 2018-06-01 ENCOUNTER — Encounter: Payer: No Typology Code available for payment source | Admitting: Family Medicine

## 2018-06-22 ENCOUNTER — Encounter: Payer: No Typology Code available for payment source | Admitting: Family Medicine

## 2018-08-28 NOTE — Progress Notes (Addendum)
[facility] [address], [address] [place], [place] [id number] [phone] [phone]  Date:  7[facility]/03/2018   Name:  April Graham   DOB:  August 24, 1980   MRN:  [id number]  PCP:  Pearline Cablesopland, Yacob Wilkerson C, MD    Chief Complaint: Annual Exam   History of Present Illness:  April Charonlaina G Garlow is a 38 y.o. 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 She thinks that her eating is much different since the splint and she is eating poorly.  She is also going to school right now and has little time to exercise She has started doing pilates- her studio is actually open   [facility] - she has had multiple operations on her knee.  She still gets injections in her knee every few months  Pap last year, up-to-date Immunizations are up-to-date- she is not sure of the exact date of her tdap but feels certain it is UTD  Can do routine labs today Mammogram last year, they did recommend a repeat this year  Wt Readings from Last 3 Encounters:  08/30/18 247 lb (112 kg)  12/22/17 217 lb 9.6 oz (98.7 kg)  05/30/17 227 lb (103 kg)   She has put on some weight over the last year.    She has not gotten sick at all  She has noted mild dysuria the last couple of days- ?UTI No back pain, abd pain or fever  She just ended her menses yesterday   There are no active problems to display for this patient.   Past Medical History:  Diagnosis Date  . Anxiety   . Arthritis   . Irritable bowel syndrome (IBS)     Past Surgical History:  Procedure Laterality Date  . ADENOIDECTOMY    . KNEE ARTHROSCOPY    . KNEE ARTHROSCOPY  W/ ACL RECONSTRUCTION    . TONSILLECTOMY    . WISDOM TOOTH EXTRACTION      Social History   Tobacco Use  . Smoking status: Never Smoker  . Smokeless tobacco: Never Used  Substance Use Topics  . Alcohol use: Yes    Comment: social  . Drug use: Yes    Family History  Problem Relation Age of Onset  . Arthritis Mother   . Depression Mother   . Diabetes Mother   . High Cholesterol Mother   . Transient ischemic attack Mother   . Arthritis Father   . Cancer Father   . COPD Father   . Diabetes Father   . Hypertension Father   . Cancer Maternal Grandfather   . Cancer Paternal Grandmother     No Known Allergies  Medication list has been reviewed and updated.  Current Outpatient Medications on File Prior to Visit  Medication Sig Dispense Refill  . LORazepam (ATIVAN) 1 MG tablet Take 0.5 tablets (0.5 mg total) by mouth as needed. May take twice daily for occasional anxiety 30 tablet 0  . Norgestimate-Ethinyl Estradiol Triphasic 0.18/0.215/0.25 MG-35 MCG tablet Take 1 tablet by mouth once daily 28 Package 3   No current facility-administered medications on file prior to  visit.     Review of Systems:  As per HPI- otherwise negative. No CP or SOB, menstrual cycles are normal.  She is on OCP Occasional constipation- she will use dulcolax prn Physical Examination: Vitals:   08/30/18 0820  BP: 122/78  Pulse: 81  Resp: 16  Temp: 98.4 F (36.9 C)  SpO2: 96%   Vitals:   08/30/18 0820  Weight: 247 lb (112 kg)  Height: 6' (1.829 m)   Body mass index is 33.5 kg/m. Ideal Body Weight: Weight in (lb) to have BMI = 25: 183.9  GEN: WDWN, NAD, Non-toxic, A & O x 3, overweight, looks well  HEENT: Atraumatic, Normocephalic. Neck supple. No masses, No LAD.  Bilateral TM wnl, oropharynx normal.  PEERL,EOMI.   Ears and Nose: No external deformity. CV: RRR, No M/G/R. No JVD. No thrill. No extra heart sounds. PULM: CTA B, no wheezes, crackles, rhonchi. No retractions. No resp.  distress. No accessory muscle use. ABD: S, NT, ND. No rebound. No HSM. EXTR: No c/c/e Chronic crepitus of right knee  NEURO Normal gait.  PSYCH: Normally interactive. Conversant. Not depressed or anxious appearing.  Calm demeanor.    Assessment and Plan:   ICD-10-CM   1. Physical exam  Z00.00   2. Screening for diabetes mellitus  Z13.1 Comprehensive metabolic panel    Hemoglobin A1c  3. Screening for hyperlipidemia  Z13.220 Lipid panel  4. Screening for deficiency anemia  Z13.0 CBC  5. Screening for breast cancer  Z12.39 MM 3D SCREEN BREAST BILATERAL    CANCELED: MM 3D SCREEN BREAST BILATERAL  6. Weight gain  R63.5 TSH  7. Chronic pain of right knee  M25.561 Ambulatory referral to Orthopedic Surgery   G89.29   8. Dysuria  R30.0 Urine Culture    POCT urinalysis dipstick    nitrofurantoin, macrocrystal-monohydrate, (MACROBID) 100 MG capsule   macrobid rx for possible UTI Otherwise await labs Encourage mammo and weight loss   Follow-up: No follow-ups on file.  Meds ordered this encounter  Medications  . nitrofurantoin, macrocrystal-monohydrate, (MACROBID) 100 MG capsule    Sig: Take 1 capsule (100 mg total) by mouth 2 (two) times daily.    Dispense:  14 capsule    Refill:  0   Orders Placed This Encounter  Procedures  . Urine Culture  . MM 3D SCREEN BREAST BILATERAL  . CBC  . Comprehensive metabolic panel  . Hemoglobin A1c  . Lipid panel  . TSH  . Ambulatory referral to Orthopedic Surgery  . POCT urinalysis dipstick    @SIGN @  Outpatient Encounter Medications as of 08/30/2018  Medication Sig  . LORazepam (ATIVAN) 1 MG tablet Take 0.5 tablets (0.5 mg total) by mouth as needed. May take twice daily for occasional anxiety  . Norgestimate-Ethinyl Estradiol Triphasic 0.18/0.215/0.25 MG-35 MCG tablet Take 1 tablet by mouth once daily  . nitrofurantoin, macrocrystal-monohydrate, (MACROBID) 100 MG capsule Take 1 capsule (100 mg total) by mouth 2 (two) times daily.  .  [DISCONTINUED] metroNIDAZOLE (FLAGYL) 500 MG tablet Take 1 tablet (500 mg total) by mouth 3 (three) times daily.  . [DISCONTINUED] nitrofurantoin, macrocrystal-monohydrate, (MACROBID) 100 MG capsule Take 1 capsule (100 mg total) by mouth 2 (two) times daily. 1 po BId   No facility-administered encounter medications on file as of 08/30/2018.      Signed Abbe AmsterdamJessica Joya Willmott, MD    Received her labs so far, message to pt   Results for orders placed or performed in visit on 08/30/18  Urine Culture  Specimen: Blood  Result Value Ref Range   MICRO NUMBER: [id number]    SPECIMEN QUALITY: Adequate    Sample Source NOT GIVEN    STATUS: FINAL    ISOLATE 1: Escherichia coli (A)       Susceptibility   Escherichia coli - URINE CULTURE, REFLEX    AMOX/CLAVULANIC 4 Sensitive     AMPICILLIN 8 Sensitive     AMPICILLIN/SULBACTAM 4 Sensitive     CEFAZOLIN* <=4 Not Reportable      * For infections other than uncomplicated UTIcaused by E. coli, K. pneumoniae or P. mirabilis:Cefazolin is resistant if MIC > or = 8 mcg/mL.(Distinguishing susceptible versus intermediatefor isolates with MIC < or = 4 mcg/mL requiresadditional testing.)For uncomplicated UTI caused by E. coli,K. pneumoniae or P. mirabilis: Cefazolin issusceptible if MIC <32 mcg/mL and predictssusceptible to the oral agents cefaclor, cefdinir,cefpodoxime, cefprozil, cefuroxime, cephalexinand loracarbef.    CEFEPIME <=1 Sensitive     CEFTRIAXONE <=1 Sensitive     CIPROFLOXACIN <=0.25 Sensitive     LEVOFLOXACIN <=0.12 Sensitive     ERTAPENEM <=0.5 Sensitive     GENTAMICIN <=1 Sensitive     IMIPENEM <=0.25 Sensitive     NITROFURANTOIN 64 Intermediate     PIP/TAZO <=4 Sensitive     TOBRAMYCIN <=1 Sensitive     TRIMETH/SULFA* <=20 Sensitive      * For infections other than uncomplicated UTIcaused by E. coli, K. pneumoniae or P. mirabilis:Cefazolin is resistant if MIC > or = 8 mcg/mL.(Distinguishing susceptible versus intermediatefor isolates with  MIC < or = 4 mcg/mL requiresadditional testing.)For uncomplicated UTI caused by E. coli,K. pneumoniae or P. mirabilis: Cefazolin issusceptible if MIC <32 mcg/mL and predictssusceptible to the oral agents cefaclor, cefdinir,cefpodoxime, cefprozil, cefuroxime, cephalexinand loracarbef.Legend:S = Susceptible  I = IntermediateR = Resistant  NS = Not susceptible* = Not tested  NR = Not reported**NN = See antimicrobic comments  CBC  Result Value Ref Range   WBC 6.5 4.0 - 10.5 K/uL   RBC 3.97 3.87 - 5.11 Mil/uL   Platelets 242.0 150.0 - 400.0 K/uL   Hemoglobin 12.7 12.0 - 15.0 g/dL   HCT 02.738.2 25.336.0 - 66.446.0 %   MCV 96.2 78.0 - 100.0 fl   MCHC 33.3 30.0 - 36.0 g/dL   RDW 40.312.7 47.411.5 - 25.915.5 %  Comprehensive metabolic panel  Result Value Ref Range   Sodium 140 135 - 145 mEq/L   Potassium 4.2 3.5 - 5.1 mEq/L   Chloride 107 96 - 112 mEq/L   CO2 27 19 - 32 mEq/L   Glucose, Bld 87 70 - 99 mg/dL   BUN 16 6 - 23 mg/dL   Creatinine, Ser 5.630.71 0.40 - 1.20 mg/dL   Total Bilirubin 0.4 0.2 - 1.2 mg/dL   Alkaline Phosphatase 49 39 - 117 U/L   AST 12 0 - 37 U/L   ALT 12 0 - 35 U/L   Total Protein 6.3 6.0 - 8.3 g/dL   Albumin 4.0 3.5 - 5.2 g/dL   Calcium 8.3 (L) 8.4 - 10.5 mg/dL   GFR 87.5691.46 >43.32>60.00 mL/min  Hemoglobin A1c  Result Value Ref Range   Hgb A1c MFr Bld 4.9 4.6 - 6.5 %  Lipid panel  Result Value Ref Range   Cholesterol 140 0 - 200 mg/dL   Triglycerides 95.195.0 0.0 - 149.0 mg/dL   HDL 88.4162.30 >66.06>39.00 mg/dL   VLDL 30.119.0 0.0 - 60.140.0 mg/dL   LDL Cholesterol 58 0 - 99 mg/dL   Total CHOL/HDL Ratio 2  NonHDL 77.25   TSH  Result Value Ref Range   TSH 0.82 0.35 - 4.50 uIU/mL  POCT urinalysis dipstick  Result Value Ref Range   Color, UA yellow yellow   Clarity, UA cloudy (A) clear   Glucose, UA negative negative mg/dL   Bilirubin, UA small (A) negative   Ketones, POC UA negative negative mg/dL   Spec Grav, UA 1.025 1.010 - 1.025   Blood, UA trace-intact (A) negative   pH, UA 6.0 5.0 - 8.0   Protein  Ur, POC trace (A) negative mg/dL   Urobilinogen, UA 1.0 0.2 or 1.0 E.U./dL   Nitrite, UA Negative Negative   Leukocytes, UA Large (3+) (A) Negative  received her urine culture 7/3- intermediate to macrobid Called to let her know, changed rx to amoxicillin

## 2018-08-30 ENCOUNTER — Other Ambulatory Visit: Payer: Self-pay

## 2018-08-30 ENCOUNTER — Encounter: Payer: Self-pay | Admitting: Family Medicine

## 2018-08-30 ENCOUNTER — Ambulatory Visit (INDEPENDENT_AMBULATORY_CARE_PROVIDER_SITE_OTHER): Payer: No Typology Code available for payment source | Admitting: Family Medicine

## 2018-08-30 VITALS — BP 122/78 | HR 81 | Temp 98.4°F | Resp 16 | Ht 72.0 in | Wt 247.0 lb

## 2018-08-30 DIAGNOSIS — Z1239 Encounter for other screening for malignant neoplasm of breast: Secondary | ICD-10-CM

## 2018-08-30 DIAGNOSIS — Z131 Encounter for screening for diabetes mellitus: Secondary | ICD-10-CM

## 2018-08-30 DIAGNOSIS — Z130 Encounter for screening for diseases of the blood and blood-forming organs and certain disorders involving the immune mechanism: Secondary | ICD-10-CM

## 2018-08-30 DIAGNOSIS — Z0000 Encounter for general adult medical examination without abnormal findings: Secondary | ICD-10-CM | POA: Diagnosis not present

## 2018-08-30 DIAGNOSIS — R300 Dysuria: Secondary | ICD-10-CM

## 2018-08-30 DIAGNOSIS — G8929 Other chronic pain: Secondary | ICD-10-CM

## 2018-08-30 DIAGNOSIS — R635 Abnormal weight gain: Secondary | ICD-10-CM | POA: Diagnosis not present

## 2018-08-30 DIAGNOSIS — M25561 Pain in right knee: Secondary | ICD-10-CM

## 2018-08-30 DIAGNOSIS — Z13220 Encounter for screening for lipoid disorders: Secondary | ICD-10-CM | POA: Diagnosis not present

## 2018-08-30 LAB — CBC
HCT: 38.2 % (ref 36.0–46.0)
Hemoglobin: 12.7 g/dL (ref 12.0–15.0)
MCHC: 33.3 g/dL (ref 30.0–36.0)
MCV: 96.2 fl (ref 78.0–100.0)
Platelets: 242 10*3/uL (ref 150.0–400.0)
RBC: 3.97 Mil/uL (ref 3.87–5.11)
RDW: 12.7 % (ref 11.5–15.5)
WBC: 6.5 10*3/uL (ref 4.0–10.5)

## 2018-08-30 LAB — POCT URINALYSIS DIP (MANUAL ENTRY)
Glucose, UA: NEGATIVE mg/dL
Ketones, POC UA: NEGATIVE mg/dL
Nitrite, UA: NEGATIVE
Spec Grav, UA: 1.025 (ref 1.010–1.025)
Urobilinogen, UA: 1 E.U./dL
pH, UA: 6 (ref 5.0–8.0)

## 2018-08-30 LAB — COMPREHENSIVE METABOLIC PANEL
ALT: 12 U/L (ref 0–35)
AST: 12 U/L (ref 0–37)
Albumin: 4 g/dL (ref 3.5–5.2)
Alkaline Phosphatase: 49 U/L (ref 39–117)
BUN: 16 mg/dL (ref 6–23)
CO2: 27 mEq/L (ref 19–32)
Calcium: 8.3 mg/dL — ABNORMAL LOW (ref 8.4–10.5)
Chloride: 107 mEq/L (ref 96–112)
Creatinine, Ser: 0.71 mg/dL (ref 0.40–1.20)
GFR: 91.46 mL/min (ref >60.00)
Glucose, Bld: 87 mg/dL (ref 70–99)
Potassium: 4.2 mEq/L (ref 3.5–5.1)
Sodium: 140 mEq/L (ref 135–145)
Total Bilirubin: 0.4 mg/dL (ref 0.2–1.2)
Total Protein: 6.3 g/dL (ref 6.0–8.3)

## 2018-08-30 LAB — LIPID PANEL
Cholesterol: 140 mg/dL (ref 0–200)
HDL: 62.3 mg/dL (ref >39.00)
LDL Cholesterol: 58 mg/dL (ref 0–99)
NonHDL: 77.25
Total CHOL/HDL Ratio: 2
Triglycerides: 95 mg/dL (ref 0.0–149.0)
VLDL: 19 mg/dL (ref 0.0–40.0)

## 2018-08-30 LAB — HEMOGLOBIN A1C: Hgb A1c MFr Bld: 4.9 % (ref 4.6–6.5)

## 2018-08-30 LAB — TSH: TSH: 0.82 u[IU]/mL (ref 0.35–4.50)

## 2018-08-30 MED ORDER — NITROFURANTOIN MONOHYD MACRO 100 MG PO CAPS: 100.0000 mg | ORAL_CAPSULE | Freq: Two times a day (BID) | ORAL | 0 refills | Status: DC

## 2018-08-30 NOTE — Patient Instructions (Addendum)
Great to see you today- I will be in touch with your labs asap. Take care!   Please get your mammogram done at your convenience Work gradually on getting your weight back to normal baseline- this will help your knees too!     Health Maintenance, Female Adopting a healthy lifestyle and getting preventive care are important in promoting health and wellness. Ask your health care provider about:  The right schedule for you to have regular tests and exams.  Things you can do on your own to prevent diseases and keep yourself healthy. What should I know about diet, weight, and exercise? Eat a healthy diet   Eat a diet that includes plenty of vegetables, fruits, low-fat dairy products, and lean protein.  Do not eat a lot of foods that are high in solid fats, added sugars, or sodium. Maintain a healthy weight Body mass index (BMI) is used to identify weight problems. It estimates body fat based on height and weight. Your health care provider can help determine your BMI and help you achieve or maintain a healthy weight. Get regular exercise Get regular exercise. This is one of the most important things you can do for your health. Most adults should:  Exercise for at least 150 minutes each week. The exercise should increase your heart rate and make you sweat (moderate-intensity exercise).  Do strengthening exercises at least twice a week. This is in addition to the moderate-intensity exercise.  Spend less time sitting. Even light physical activity can be beneficial. Watch cholesterol and blood lipids Have your blood tested for lipids and cholesterol at 39 years of age, then have this test every 5 years. Have your cholesterol levels checked more often if:  Your lipid or cholesterol levels are high.  You are older than 39 years of age.  You are at high risk for heart disease. What should I know about cancer screening? Depending on your health history and family history, you may need to have  cancer screening at various ages. This may include screening for:  Breast cancer.  Cervical cancer.  Colorectal cancer.  Skin cancer.  Lung cancer. What should I know about heart disease, diabetes, and high blood pressure? Blood pressure and heart disease  High blood pressure causes heart disease and increases the risk of stroke. This is more likely to develop in people who have high blood pressure readings, are of African descent, or are overweight.  Have your blood pressure checked: ? Every 3-5 years if you are 73-62 years of age. ? Every year if you are 29 years old or older. Diabetes Have regular diabetes screenings. This checks your fasting blood sugar level. Have the screening done:  Once every three years after age 5 if you are at a normal weight and have a low risk for diabetes.  More often and at a younger age if you are overweight or have a high risk for diabetes. What should I know about preventing infection? Hepatitis B If you have a higher risk for hepatitis B, you should be screened for this virus. Talk with your health care provider to find out if you are at risk for hepatitis B infection. Hepatitis C Testing is recommended for:  Everyone born from [birth year] through 1966.  Anyone with known risk factors for hepatitis C. Sexually transmitted infections (STIs)  Get screened for STIs, including gonorrhea and chlamydia, if: ? You are sexually active and are younger than 39 years of age. ? You are older than 39 years  of age and your health care provider tells you that you are at risk for this type of infection. ? Your sexual activity has changed since you were last screened, and you are at increased risk for chlamydia or gonorrhea. Ask your health care provider if you are at risk.  Ask your health care provider about whether you are at high risk for HIV. Your health care provider may recommend a prescription medicine to help prevent HIV infection. If you choose to take  medicine to prevent HIV, you should first get tested for HIV. You should then be tested every 3 months for as long as you are taking the medicine. Pregnancy  If you are about to stop having your period (premenopausal) and you may become pregnant, seek counseling before you get pregnant.  Take 400 to 800 micrograms (mcg) of folic acid every day if you become pregnant.  Ask for birth control (contraception) if you want to prevent pregnancy. Osteoporosis and menopause Osteoporosis is a disease in which the bones lose minerals and strength with aging. This can result in bone fractures. If you are 61 years old or older, or if you are at risk for osteoporosis and fractures, ask your health care provider if you should:  Be screened for bone loss.  Take a calcium or vitamin D supplement to lower your risk of fractures.  Be given hormone replacement therapy (HRT) to treat symptoms of menopause. Follow these instructions at home: Lifestyle  Do not use any products that contain nicotine or tobacco, such as cigarettes, e-cigarettes, and chewing tobacco. If you need help quitting, ask your health care provider.  Do not use street drugs.  Do not share needles.  Ask your health care provider for help if you need support or information about quitting drugs. Alcohol use  Do not drink alcohol if: ? Your health care provider tells you not to drink. ? You are pregnant, may be pregnant, or are planning to become pregnant.  If you drink alcohol: ? Limit how much you use to 0-1 drink a day. ? Limit intake if you are breastfeeding.  Be aware of how much alcohol is in your drink. In the [place] one drink equals one 12 oz bottle of beer (355 mL), one 5 oz glass of wine (148 mL), or one 1 oz glass of hard liquor (44 mL). General instructions  Schedule regular health, dental, and eye exams.  Stay current with your vaccines.  Tell your health care provider if: ? You often feel depressed. ? You have  ever been abused or do not feel safe at home. Summary  Adopting a healthy lifestyle and getting preventive care are important in promoting health and wellness.  Follow your health care provider's instructions about healthy diet, exercising, and getting tested or screened for diseases.  Follow your health care provider's instructions on monitoring your cholesterol and blood pressure. This information is not intended to replace advice given to you by your health care provider. Make sure you discuss any questions you have with your health care provider. Document Released: 08/31/2010 Document Revised: 02/08/2018 Document Reviewed: 02/08/2018 Elsevier Patient Education  2020 [employer].

## 2018-09-01 LAB — URINE CULTURE
MICRO NUMBER:: 625839
SPECIMEN QUALITY:: ADEQUATE

## 2018-09-01 MED ORDER — AMOXICILLIN 500 MG PO CAPS: 500.0000 mg | ORAL_CAPSULE | Freq: Two times a day (BID) | ORAL | 0 refills | Status: DC

## 2018-09-01 NOTE — Addendum Note (Signed)
Addended by: Lamar Blinks C on: 09/01/2018 11:45 AM   Modules accepted: Orders

## 2018-11-06 ENCOUNTER — Telehealth: Payer: No Typology Code available for payment source | Admitting: Nurse Practitioner

## 2018-11-06 DIAGNOSIS — N3000 Acute cystitis without hematuria: Secondary | ICD-10-CM | POA: Diagnosis not present

## 2018-11-06 MED ORDER — NITROFURANTOIN MONOHYD MACRO 100 MG PO CAPS: 100.0000 mg | ORAL_CAPSULE | Freq: Two times a day (BID) | ORAL | 0 refills | Status: DC

## 2018-11-06 NOTE — Progress Notes (Signed)

## 2018-12-08 ENCOUNTER — Other Ambulatory Visit: Payer: Self-pay | Admitting: Family Medicine

## 2018-12-08 DIAGNOSIS — F418 Other specified anxiety disorders: Secondary | ICD-10-CM

## 2018-12-08 MED ORDER — LORAZEPAM 1 MG PO TABS: 0.5000 mg | ORAL_TABLET | ORAL | 0 refills | Status: DC | PRN

## 2018-12-22 ENCOUNTER — Encounter: Payer: Self-pay | Admitting: Family Medicine

## 2019-03-18 ENCOUNTER — Other Ambulatory Visit: Payer: Self-pay | Admitting: Family Medicine

## 2019-03-18 DIAGNOSIS — Z3041 Encounter for surveillance of contraceptive pills: Secondary | ICD-10-CM

## 2019-03-19 ENCOUNTER — Other Ambulatory Visit: Payer: Self-pay

## 2019-03-19 DIAGNOSIS — Z3041 Encounter for surveillance of contraceptive pills: Secondary | ICD-10-CM

## 2019-03-19 MED ORDER — NORGESTIM-ETH ESTRAD TRIPHASIC 0.18/0.215/0.25 MG-35 MCG PO TABS: 1.0000 | ORAL_TABLET | Freq: Every day | ORAL | 11 refills | Status: DC

## 2019-03-27 ENCOUNTER — Telehealth: Payer: No Typology Code available for payment source | Admitting: Nurse Practitioner

## 2019-03-27 DIAGNOSIS — N3000 Acute cystitis without hematuria: Secondary | ICD-10-CM

## 2019-03-27 MED ORDER — CEPHALEXIN 500 MG PO CAPS: 500.0000 mg | ORAL_CAPSULE | Freq: Two times a day (BID) | ORAL | 0 refills | Status: DC

## 2019-03-27 NOTE — Progress Notes (Signed)

## 2019-05-07 ENCOUNTER — Telehealth: Payer: No Typology Code available for payment source | Admitting: Physician Assistant

## 2019-05-07 DIAGNOSIS — R399 Unspecified symptoms and signs involving the genitourinary system: Secondary | ICD-10-CM | POA: Diagnosis not present

## 2019-05-07 MED ORDER — NITROFURANTOIN MONOHYD MACRO 100 MG PO CAPS: 100.0000 mg | ORAL_CAPSULE | Freq: Two times a day (BID) | ORAL | 0 refills | Status: AC

## 2019-05-07 MED ORDER — FLUCONAZOLE 150 MG PO TABS: 150.0000 mg | ORAL_TABLET | Freq: Once | ORAL | 0 refills | Status: DC | PRN

## 2019-05-07 NOTE — Progress Notes (Signed)
We are sorry that you are not feeling well.  Here is how we plan to help!  Based on what you shared with me it looks like you most likely have a simple urinary tract infection.  A UTI (Urinary Tract Infection) is a bacterial infection of the bladder.  Most cases of urinary tract infections are simple to treat but a key part of your care is to encourage you to drink plenty of fluids and watch your symptoms carefully.  I have prescribed MacroBid 100 mg twice a day for 5 days.  Your symptoms should gradually improve. I have also prescribed a tablet of diflucan to be taken only if you develop yeast infection symptoms. Call us if the burning in your urine worsens, you develop worsening fever, back pain or pelvic pain or if your symptoms do not resolve after completing the antibiotic.  Urinary tract infections can be prevented by drinking plenty of water to keep your body hydrated.  Also be sure when you wipe, wipe from front to back and don't hold it in!  If possible, empty your bladder every 4 hours.  Your e-visit answers were reviewed by a board certified advanced clinical practitioner to complete your personal care plan.  Depending on the condition, your plan could have included both over the counter or prescription medications.  If there is a problem please reply  once you have received a response from your provider.  Your safety is important to [place].  If you have drug allergies check your prescription carefully.    You can use MyChart to ask questions about today's visit, request a non-urgent call back, or ask for a work or school excuse for 24 hours related to this e-Visit. If it has been greater than 24 hours you will need to follow up with your provider, or enter a new e-Visit to address those concerns.   You will get an e-mail in the next two days asking about your experience.  I hope that your e-visit has been valuable and will speed your recovery. Thank you for using e-visits.  Greater  than 5 minutes, yet less than 10 minutes of time have been spent researching, coordinating, and implementing care for this patient today.

## 2019-05-22 ENCOUNTER — Encounter: Payer: Self-pay | Admitting: Family Medicine

## 2019-06-12 ENCOUNTER — Encounter: Payer: Self-pay | Admitting: Medical

## 2019-06-12 ENCOUNTER — Other Ambulatory Visit: Payer: Self-pay

## 2019-06-12 ENCOUNTER — Ambulatory Visit (INDEPENDENT_AMBULATORY_CARE_PROVIDER_SITE_OTHER): Payer: No Typology Code available for payment source | Admitting: Medical

## 2019-06-12 VITALS — BP 112/78 | HR 71 | Temp 97.4°F | Resp 18 | Ht 72.0 in | Wt 266.0 lb

## 2019-06-12 DIAGNOSIS — R399 Unspecified symptoms and signs involving the genitourinary system: Secondary | ICD-10-CM

## 2019-06-12 LAB — POC URINALSYSI DIPSTICK (AUTOMATED)
Bilirubin, UA: NEGATIVE
Blood, UA: 2
Glucose, UA: NEGATIVE
Ketones, UA: NEGATIVE
Nitrite, UA: POSITIVE
Protein, UA: POSITIVE — AB
Spec Grav, UA: >=1.03 — AB (ref 1.010–1.025)
Urobilinogen, UA: 0.2 E.U./dL
pH, UA: 5.5 (ref 5.0–8.0)

## 2019-06-12 MED ORDER — NITROFURANTOIN MONOHYD MACRO 100 MG PO CAPS: 100.0000 mg | ORAL_CAPSULE | Freq: Two times a day (BID) | ORAL | 0 refills | Status: DC

## 2019-06-12 NOTE — Progress Notes (Signed)
Subjective:    Patient ID: April Graham, female    DOB: [birth year], 39 y.o.   MRN: [id number]  HPI   Pt states in march she had e-visit for uti. States got keflex. She states her symptoms never cleared. She states some cloudy appearance to urine and concentrated. Some odor to urine and some slight discomfort. No fever, no chills, no sweats and no cva pain. She was taking keflex twice a day at that time.  Hx of uti in the past.  LMP- almost one month ago.   Review of Systems  Constitutional: Negative for chills, fatigue and fever.  Respiratory: Negative for cough, chest tightness, shortness of breath and wheezing.   Cardiovascular: Negative for chest pain and palpitations.  Gastrointestinal: Negative for abdominal pain.  Genitourinary: Negative for dysuria, frequency, pelvic pain and urgency.       Faint discomfort of suprapubic area.  Musculoskeletal: Negative for back pain and neck pain.  Skin: Negative for rash.  Neurological: Negative for dizziness, speech difficulty, weakness, numbness and headaches.  Hematological: Negative for adenopathy. Does not bruise/bleed easily.  Psychiatric/Behavioral: Negative for behavioral problems, confusion and sleep disturbance. The patient is not nervous/anxious.     Past Medical History:  Diagnosis Date  . Anxiety   . Arthritis   . Irritable bowel syndrome (IBS)      Social History   Socioeconomic History  . Marital status: Divorced    Spouse name: Not on file  . Number of children: Not on file  . Years of education: Not on file  . Highest education level: Not on file  Occupational History  . Not on file  Tobacco Use  . Smoking status: Never Smoker  . Smokeless tobacco: Never Used  Substance and Sexual Activity  . Alcohol use: Yes    Comment: social  . Drug use: Yes  . Sexual activity: Yes    Birth control/protection: Pill  Other Topics Concern  . Not on file  Social History Narrative  . Not on file   Social  Determinants of Health   Financial Resource Strain:   . Difficulty of Paying Living Expenses:   Food Insecurity:   . Worried About Programme researcher, broadcasting/film/video in the Last Year:   . Barista in the Last Year:   Transportation Needs:   . Freight forwarder (Medical):   Marland Kitchen Lack of Transportation (Non-Medical):   Physical Activity:   . Days of Exercise per Week:   . Minutes of Exercise per Session:   Stress:   . Feeling of Stress :   Social Connections:   . Frequency of Communication with Friends and Family:   . Frequency of Social Gatherings with Friends and Family:   . Attends Religious Services:   . Active Member of Clubs or Organizations:   . Attends Banker Meetings:   Marland Kitchen Marital Status:   Intimate Partner Violence:   . Fear of Current or Ex-Partner:   . Emotionally Abused:   Marland Kitchen Physically Abused:   . Sexually Abused:     Past Surgical History:  Procedure Laterality Date  . ADENOIDECTOMY    . KNEE ARTHROSCOPY    . KNEE ARTHROSCOPY W/ ACL RECONSTRUCTION    . TONSILLECTOMY    . WISDOM TOOTH EXTRACTION      Family History  Problem Relation Age of Onset  . Arthritis Mother   . Depression Mother   . Diabetes Mother   . High Cholesterol Mother   .  Transient ischemic attack Mother   . Arthritis Father   . Cancer Father   . COPD Father   . Diabetes Father   . Hypertension Father   . Cancer Maternal Grandfather   . Cancer Paternal Grandmother     No Known Allergies  Current Outpatient Medications on File Prior to Visit  Medication Sig Dispense Refill  . amoxicillin (AMOXIL) 500 MG capsule Take 1 capsule (500 mg total) by mouth 2 (two) times daily. (Patient not taking: Reported on 06/12/2019) 10 capsule 0  . cephALEXin (KEFLEX) 500 MG capsule Take 1 capsule (500 mg total) by mouth 2 (two) times daily. (Patient not taking: Reported on 06/12/2019) 14 capsule 0  . fluconazole (DIFLUCAN) 150 MG tablet Take 1 tablet (150 mg total) by mouth once as needed for  up to 1 dose (yeast infection). 1 tablet 0  . LORazepam (ATIVAN) 1 MG tablet Take 0.5 tablets (0.5 mg total) by mouth as needed. May take twice daily for occasional anxiety 30 tablet 0  . Norgestimate-Ethinyl Estradiol Triphasic (TRI-ESTARYLLA) 0.18/0.215/0.25 MG-35 MCG tablet Take 1 tablet by mouth daily. 28 tablet 11   No current facility-administered medications on file prior to visit.    BP 112/78 (BP Location: Left Arm, Patient Position: Sitting, Cuff Size: Large)   Pulse 71   Temp (!) 97.4 F (36.3 C) (Temporal)   Resp 18   Ht 6' (1.829 m)   Wt 266 lb (120.7 kg)   SpO2 98%   BMI 36.08 kg/m       Objective:   Physical Exam  General- No acute distress. Pleasant patient. Neck- Full range of motion, no jvd Lungs- Clear, even and unlabored. Heart- regular rate and rhythm. Neurologic- CNII- XII grossly intact.  Abdomen- soft, nt, nd, +bs, no rebound or guarding. Faint suprapubic tenderness.  Back- no cva tenderness.      Assessment & Plan:  You appear to have a urinary tract infection by recent signs and symptoms as well as UA result. I am prescribing macrobid antibiotic for the probable infection. Hydrate well. I am sending out a urine culture. During the interim if your signs and symptoms worsen rather than improving please notify us. We will notify your when the culture results are back.  Follow up in 7 days or as needed.  Time spent with patient today was  20  minutes which consisted of discussing diagnosis, work up, treatment plan  and documentation.

## 2019-06-12 NOTE — Patient Instructions (Signed)
You appear to have a urinary tract infection by recent signs and symptoms as well as UA result. I am prescribing macrobid antibiotic for the probable infection. Hydrate well. I am sending out a urine culture. During the interim if your signs and symptoms worsen rather than improving please notify us. We will notify your when the culture results are back.  Follow up in 7 days or as needed.

## 2019-06-14 LAB — URINE CULTURE
MICRO NUMBER:: 10357160
SPECIMEN QUALITY:: ADEQUATE

## 2019-09-04 NOTE — Progress Notes (Addendum)
[facility] [address] [place], [place] [address] [phone]  Date:  09/05/2019   Name:  April Graham   DOB:  04/08/1980   MRN:  [id number]  PCP:  Pearline Cables, MD    Chief Complaint: Annual Exam (no pap)   History of Present Illness:  April Graham is a 39 y.o. very pleasant female patient who presents with the following:  Generally healthy young woman with history of IBS, here today for physical exam Last seen by myself about 1 year ago for physical  Covid series- done, she will be in touch with dates.  phizer  Tetanus-should be up-to-date per employee health Pap 2019 Mammogram- will order for her not done yet Labs about 1 year ago  At her visit 1 year ago, she had separated from her husband about 1 year prior- they are now divorced.  She noted that her eating pattern had not been quite as good since they separated.   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  She is also currently in graduate school, she finished up her program in December Her eating habits have been erratic, it is difficult to follow a healthy diet and exercise with her intense work schedule  She has a BF and they are moving in soon- he has 4 children!  She is very happy in her new relationship  She has struggled some with UTIs over the last several months but is otherwise felt reasonably well  Wt Readings from Last 3 Encounters:  09/05/19 268 lb (121.6 kg)  06/12/19 266 lb (120.7 kg)  08/30/18 247 lb (112 kg)   She considers her normal weight to be about 200- 225 She is distressed about weight gain, she has not had much luck in losing.  She is not typically able to exercise because of her schedule.  She is not aware of any symptoms of sleep apnea such as snoring or apneic spells We discussed possibly  trying Saxenda for weight loss, she notes no personal or family history of thyroid cancer, she would like to give this a try  She is on OCP for contraception and her boyfriend also recently had a vasectomy.  Her LMP was about 3 weeks ago  The patient did have COVID-19 back in October. There are no problems to display for this patient.   Past Medical History:  Diagnosis Date  . Anxiety   . Arthritis   . Irritable bowel syndrome (IBS)     Past Surgical History:  Procedure Laterality Date  . ADENOIDECTOMY    . KNEE ARTHROSCOPY    . KNEE ARTHROSCOPY W/ ACL RECONSTRUCTION    . TONSILLECTOMY    . WISDOM TOOTH EXTRACTION      Social History   Tobacco Use  . Smoking status: Never Smoker  . Smokeless tobacco: Never Used  Substance Use Topics  . Alcohol use: Yes    Comment: social  . Drug use: Never    Family History  Problem Relation Age of Onset  . Arthritis Mother   . Depression Mother   . Diabetes Mother   . High Cholesterol Mother   . Transient ischemic attack Mother   . Arthritis Father   . Cancer Father   . COPD Father   . Diabetes  Father   . Hypertension Father   . Cancer Maternal Grandfather   . Cancer Paternal Grandmother     No Known Allergies  Medication list has been reviewed and updated.  Current Outpatient Medications on File Prior to Visit  Medication Sig Dispense Refill  . LORazepam (ATIVAN) 1 MG tablet Take 0.5 tablets (0.5 mg total) by mouth as needed. May take twice daily for occasional anxiety 30 tablet 0  . Norgestimate-Ethinyl Estradiol Triphasic (TRI-ESTARYLLA) 0.18/0.215/0.25 MG-35 MCG tablet Take 1 tablet by mouth daily. 28 tablet 11   No current facility-administered medications on file prior to visit.    Review of Systems:  As per HPI- otherwise negative.   Physical Examination: Vitals:   09/05/19 0911  BP: 122/82  Pulse: 81  Resp: 17  Temp: 98.4 F (36.9 C)  SpO2: 98%   Vitals:   09/05/19 0911  Weight: 268 lb (121.6  kg)  Height: 6' (1.829 m)   Body mass index is 36.35 kg/m. Ideal Body Weight: Weight in (lb) to have BMI = 25: 183.9  GEN: no acute distress.  Obese, tall build.  Otherwise looks well HEENT: Atraumatic, Normocephalic.   Bilateral TM wnl, oropharynx normal.  PEERL,EOMI.   Ears and Nose: No external deformity. CV: RRR, No M/G/R. No JVD. No thrill. No extra heart sounds. PULM: CTA B, no wheezes, crackles, rhonchi. No retractions. No resp. distress. No accessory muscle use. ABD: S, NT, ND, +BS. No rebound. No HSM. EXTR: No c/c/e PSYCH: Normally interactive. Conversant.    Assessment and Plan: Physical exam  Screening for deficiency anemia - Plan: CBC  Screening for hyperlipidemia - Plan: Lipid panel  Screening for diabetes mellitus - Plan: Comprehensive metabolic panel, Hemoglobin A1c  Encounter for screening mammogram for malignant neoplasm of breast - Plan: MM 3D SCREEN BREAST BILATERAL  Screening for thyroid disorder - Plan: TSH  Encounter for hepatitis C screening test for low risk patient - Plan: Hepatitis C antibody  Encounter for surveillance of contraceptive pills - Plan: Norgestimate-Ethinyl Estradiol Triphasic (TRI-ESTARYLLA) 0.18/0.215/0.25 MG-35 MCG tablet  Situational anxiety - Plan: LORazepam (ATIVAN) 1 MG tablet  Morbid obesity (HCC) - Plan: Liraglutide -Weight Management (SAXENDA) 18 MG/3ML SOPN, Insulin Pen Needle (PEN NEEDLES) 31G X 5 MM MISC  Hematuria, unspecified type  Here today for physical exam.  Routine labs pending as above.  Ordered mammogram, refill birth control pills and Ativan which she uses on occasion Discussed obesity, she is interested in trying [place] if insurance will cover.  We discussed how to use it, I wrote her prescription.  She will let me know how this works for her Encouraged healthy diet and exercise as much as she is able.  She does not smoke or drink to excess Will plan further follow- up pending labs.  This visit occurred  during the SARS-CoV-2 public health emergency.  Safety protocols were in place, including screening questions prior to the visit, additional usage of staff PPE, and extensive cleaning of exam room while observing appropriate contact time as indicated for disinfecting solutions.    Signed Abbe Amsterdam, MD  Received her labs as below, message to patient  Results for orders placed or performed in visit on 09/05/19  CBC  Result Value Ref Range   WBC 7.2 4.0 - 10.5 K/uL   RBC 4.24 3.87 - 5.11 Mil/uL   Platelets 239.0 150 - 400 K/uL   Hemoglobin 13.5 12.0 - 15.0 g/dL   HCT 57.3 36 - 46 %   MCV 95.4  78.0 - 100.0 fl   MCHC 33.5 30.0 - 36.0 g/dL   RDW 08.6 76.1 - 95.0 %  Comprehensive metabolic panel  Result Value Ref Range   Sodium 137 135 - 145 mEq/L   Potassium 4.4 3.5 - 5.1 mEq/L   Chloride 105 96 - 112 mEq/L   CO2 24 19 - 32 mEq/L   Glucose, Bld 92 70 - 99 mg/dL   BUN 13 6 - 23 mg/dL   Creatinine, Ser 9.32 0.40 - 1.20 mg/dL   Total Bilirubin 0.4 0.2 - 1.2 mg/dL   Alkaline Phosphatase 45 39 - 117 U/L   AST 14 0 - 37 U/L   ALT 12 0 - 35 U/L   Total Protein 6.8 6.0 - 8.3 g/dL   Albumin 4.3 3.5 - 5.2 g/dL   GFR 67.12 >45.80 mL/min   Calcium 9.1 8.4 - 10.5 mg/dL  Hemoglobin D9I  Result Value Ref Range   Hgb A1c MFr Bld 4.8 4.6 - 6.5 %  Lipid panel  Result Value Ref Range   Cholesterol 170 0 - 200 mg/dL   Triglycerides 338.2 (H) 0 - 149 mg/dL   HDL 50.53 >97.67 mg/dL   VLDL 34.1 0.0 - 93.7 mg/dL   LDL Cholesterol 76 0 - 99 mg/dL   Total CHOL/HDL Ratio 3    NonHDL 106.37   TSH  Result Value Ref Range   TSH 1.66 0.35 - 4.50 uIU/mL

## 2019-09-04 NOTE — Patient Instructions (Addendum)
It was great to see you again today, I will be in touch with your labs as it is possible Stop by imaging on the ground floor and see if you can do your mammo today We refilled your medications- I also gave you an rx for Saxenda injection for weight loss.  Please read their website and study how to use the pen- let me know if any questions!     Health Maintenance, Female Adopting a healthy lifestyle and getting preventive care are important in promoting health and wellness. Ask your health care provider about:  The right schedule for you to have regular tests and exams.  Things you can do on your own to prevent diseases and keep yourself healthy. What should I know about diet, weight, and exercise? Eat a healthy diet   Eat a diet that includes plenty of vegetables, fruits, low-fat dairy products, and lean protein.  Do not eat a lot of foods that are high in solid fats, added sugars, or sodium. Maintain a healthy weight Body mass index (BMI) is used to identify weight problems. It estimates body fat based on height and weight. Your health care provider can help determine your BMI and help you achieve or maintain a healthy weight. Get regular exercise Get regular exercise. This is one of the most important things you can do for your health. Most adults should:  Exercise for at least 150 minutes each week. The exercise should increase your heart rate and make you sweat (moderate-intensity exercise).  Do strengthening exercises at least twice a week. This is in addition to the moderate-intensity exercise.  Spend less time sitting. Even light physical activity can be beneficial. Watch cholesterol and blood lipids Have your blood tested for lipids and cholesterol at 40 years of age, then have this test every 5 years. Have your cholesterol levels checked more often if:  Your lipid or cholesterol levels are high.  You are older than 40 years of age.  You are at high risk for heart  disease. What should I know about cancer screening? Depending on your health history and family history, you may need to have cancer screening at various ages. This may include screening for:  Breast cancer.  Cervical cancer.  Colorectal cancer.  Skin cancer.  Lung cancer. What should I know about heart disease, diabetes, and high blood pressure? Blood pressure and heart disease  High blood pressure causes heart disease and increases the risk of stroke. This is more likely to develop in people who have high blood pressure readings, are of African descent, or are overweight.  Have your blood pressure checked: ? Every 3-5 years if you are 64-73 years of age. ? Every year if you are 73 years old or older. Diabetes Have regular diabetes screenings. This checks your fasting blood sugar level. Have the screening done:  Once every three years after age 14 if you are at a normal weight and have a low risk for diabetes.  More often and at a younger age if you are overweight or have a high risk for diabetes. What should I know about preventing infection? Hepatitis B If you have a higher risk for hepatitis B, you should be screened for this virus. Talk with your health care provider to find out if you are at risk for hepatitis B infection. Hepatitis C Testing is recommended for:  Everyone born from [birth year] through 1966.  Anyone with known risk factors for hepatitis C. Sexually transmitted infections (STIs)  Get  screened for STIs, including gonorrhea and chlamydia, if: ? You are sexually active and are younger than 40 years of age. ? You are older than 40 years of age and your health care provider tells you that you are at risk for this type of infection. ? Your sexual activity has changed since you were last screened, and you are at increased risk for chlamydia or gonorrhea. Ask your health care provider if you are at risk.  Ask your health care provider about whether you are at high  risk for HIV. Your health care provider may recommend a prescription medicine to help prevent HIV infection. If you choose to take medicine to prevent HIV, you should first get tested for HIV. You should then be tested every 3 months for as long as you are taking the medicine. Pregnancy  If you are about to stop having your period (premenopausal) and you may become pregnant, seek counseling before you get pregnant.  Take 400 to 800 micrograms (mcg) of folic acid every day if you become pregnant.  Ask for birth control (contraception) if you want to prevent pregnancy. Osteoporosis and menopause Osteoporosis is a disease in which the bones lose minerals and strength with aging. This can result in bone fractures. If you are 69 years old or older, or if you are at risk for osteoporosis and fractures, ask your health care provider if you should:  Be screened for bone loss.  Take a calcium or vitamin D supplement to lower your risk of fractures.  Be given hormone replacement therapy (HRT) to treat symptoms of menopause. Follow these instructions at home: Lifestyle  Do not use any products that contain nicotine or tobacco, such as cigarettes, e-cigarettes, and chewing tobacco. If you need help quitting, ask your health care provider.  Do not use street drugs.  Do not share needles.  Ask your health care provider for help if you need support or information about quitting drugs. Alcohol use  Do not drink alcohol if: ? Your health care provider tells you not to drink. ? You are pregnant, may be pregnant, or are planning to become pregnant.  If you drink alcohol: ? Limit how much you use to 0-1 drink a day. ? Limit intake if you are breastfeeding.  Be aware of how much alcohol is in your drink. In the [place] one drink equals one 12 oz bottle of beer (355 mL), one 5 oz glass of wine (148 mL), or one 1 oz glass of hard liquor (44 mL). General instructions  Schedule regular health, dental,  and eye exams.  Stay current with your vaccines.  Tell your health care provider if: ? You often feel depressed. ? You have ever been abused or do not feel safe at home. Summary  Adopting a healthy lifestyle and getting preventive care are important in promoting health and wellness.  Follow your health care provider's instructions about healthy diet, exercising, and getting tested or screened for diseases.  Follow your health care provider's instructions on monitoring your cholesterol and blood pressure. This information is not intended to replace advice given to you by your health care provider. Make sure you discuss any questions you have with your health care provider. Document Revised: 02/08/2018 Document Reviewed: 02/08/2018 Elsevier Patient Education  2020 [employer].

## 2019-09-05 ENCOUNTER — Encounter: Payer: Self-pay | Admitting: Family Medicine

## 2019-09-05 ENCOUNTER — Ambulatory Visit (INDEPENDENT_AMBULATORY_CARE_PROVIDER_SITE_OTHER): Payer: No Typology Code available for payment source | Admitting: Family Medicine

## 2019-09-05 ENCOUNTER — Other Ambulatory Visit: Payer: Self-pay

## 2019-09-05 VITALS — BP 122/82 | HR 81 | Temp 98.4°F | Resp 17 | Ht 72.0 in | Wt 268.0 lb

## 2019-09-05 DIAGNOSIS — Z13220 Encounter for screening for lipoid disorders: Secondary | ICD-10-CM | POA: Diagnosis not present

## 2019-09-05 DIAGNOSIS — Z3041 Encounter for surveillance of contraceptive pills: Secondary | ICD-10-CM

## 2019-09-05 DIAGNOSIS — Z131 Encounter for screening for diabetes mellitus: Secondary | ICD-10-CM

## 2019-09-05 DIAGNOSIS — Z1329 Encounter for screening for other suspected endocrine disorder: Secondary | ICD-10-CM

## 2019-09-05 DIAGNOSIS — F418 Other specified anxiety disorders: Secondary | ICD-10-CM

## 2019-09-05 DIAGNOSIS — Z0000 Encounter for general adult medical examination without abnormal findings: Secondary | ICD-10-CM

## 2019-09-05 DIAGNOSIS — Z1231 Encounter for screening mammogram for malignant neoplasm of breast: Secondary | ICD-10-CM

## 2019-09-05 DIAGNOSIS — Z130 Encounter for screening for diseases of the blood and blood-forming organs and certain disorders involving the immune mechanism: Secondary | ICD-10-CM | POA: Diagnosis not present

## 2019-09-05 DIAGNOSIS — Z1159 Encounter for screening for other viral diseases: Secondary | ICD-10-CM

## 2019-09-05 DIAGNOSIS — R319 Hematuria, unspecified: Secondary | ICD-10-CM

## 2019-09-05 LAB — LIPID PANEL
Cholesterol: 170 mg/dL (ref 0–200)
HDL: 63.7 mg/dL (ref >39.00)
LDL Cholesterol: 76 mg/dL (ref 0–99)
NonHDL: 106.37
Total CHOL/HDL Ratio: 3
Triglycerides: 153 mg/dL — ABNORMAL HIGH (ref 0.0–149.0)
VLDL: 30.6 mg/dL (ref 0.0–40.0)

## 2019-09-05 LAB — COMPREHENSIVE METABOLIC PANEL
ALT: 12 U/L (ref 0–35)
AST: 14 U/L (ref 0–37)
Albumin: 4.3 g/dL (ref 3.5–5.2)
Alkaline Phosphatase: 45 U/L (ref 39–117)
BUN: 13 mg/dL (ref 6–23)
CO2: 24 mEq/L (ref 19–32)
Calcium: 9.1 mg/dL (ref 8.4–10.5)
Chloride: 105 mEq/L (ref 96–112)
Creatinine, Ser: 0.73 mg/dL (ref 0.40–1.20)
GFR: 88.12 mL/min (ref >60.00)
Glucose, Bld: 92 mg/dL (ref 70–99)
Potassium: 4.4 mEq/L (ref 3.5–5.1)
Sodium: 137 mEq/L (ref 135–145)
Total Bilirubin: 0.4 mg/dL (ref 0.2–1.2)
Total Protein: 6.8 g/dL (ref 6.0–8.3)

## 2019-09-05 LAB — CBC
HCT: 40.4 % (ref 36.0–46.0)
Hemoglobin: 13.5 g/dL (ref 12.0–15.0)
MCHC: 33.5 g/dL (ref 30.0–36.0)
MCV: 95.4 fl (ref 78.0–100.0)
Platelets: 239 10*3/uL (ref 150.0–400.0)
RBC: 4.24 Mil/uL (ref 3.87–5.11)
RDW: 12.8 % (ref 11.5–15.5)
WBC: 7.2 10*3/uL (ref 4.0–10.5)

## 2019-09-05 LAB — HEMOGLOBIN A1C: Hgb A1c MFr Bld: 4.8 % (ref 4.6–6.5)

## 2019-09-05 LAB — TSH: TSH: 1.66 u[IU]/mL (ref 0.35–4.50)

## 2019-09-05 MED ORDER — LORAZEPAM 1 MG PO TABS: 0.5000 mg | ORAL_TABLET | ORAL | 0 refills | Status: DC | PRN

## 2019-09-05 MED ORDER — NORGESTIM-ETH ESTRAD TRIPHASIC 0.18/0.215/0.25 MG-35 MCG PO TABS: 1.0000 | ORAL_TABLET | Freq: Every day | ORAL | 3 refills | Status: DC

## 2019-09-05 MED ORDER — PEN NEEDLES 31G X 5 MM MISC: 1.0000 | Freq: Every day | 99 refills | Status: DC

## 2019-09-05 MED ORDER — SAXENDA 18 MG/3ML ~~LOC~~ SOPN: PEN_INJECTOR | SUBCUTANEOUS | 3 refills | Status: DC

## 2019-09-05 MED FILL — NORGESTIM-ETH ESTRAD TRIPHA: 0.18/0.215/ | 84 days supply | Qty: 84 | Fill #0

## 2019-09-05 MED FILL — LORazepam 1 MG TABS: 1 | 30 days supply | Qty: 30 | Fill #0

## 2019-09-06 ENCOUNTER — Telehealth: Payer: Self-pay

## 2019-09-06 LAB — HEPATITIS C ANTIBODY
Hepatitis C Ab: NONREACTIVE
SIGNAL TO CUT-OFF: 0.02 (ref <1.00)

## 2019-09-06 NOTE — Telephone Encounter (Signed)
Could you initiate approval for saxenda?

## 2019-09-06 NOTE — Telephone Encounter (Signed)
PA initiated via Covermymeds; KEY: [id number]. Awaiting determination.

## 2019-09-11 NOTE — Telephone Encounter (Signed)
PA approved.   The request has been approved. The authorization is effective for a maximum of 4 fills from 09/10/2019 to 01/10/2020, as long as the member is enrolled in their current health plan. The request was approved as submitted. This request has been approved for 43mL (1 box containing 5 of the 3 mL pens) per 30 days. Renewal for Saxenda requires that the patient has achieved or maintained at least 4% weight loss of baseline body weight after 4 months of treatment. Please have the pharmacy contact [employer] Customer Service at [phone] if billing assistance is required. A written notification letter will follow with additional details.

## 2019-09-15 ENCOUNTER — Encounter: Payer: Self-pay | Admitting: Family Medicine

## 2019-09-17 MED FILL — UNIFINE PENTIPS 31GX3/16: 31G X 5 MM | 90 days supply | Qty: 100 | Fill #0

## 2019-09-17 MED FILL — SAXENDA 18 MG/3 ML PEN: 18 | 44 days supply | Qty: 15 | Fill #0

## 2019-09-17 NOTE — Telephone Encounter (Signed)
Error

## 2019-09-18 ENCOUNTER — Telehealth: Payer: Self-pay | Admitting: Family Medicine

## 2019-09-18 NOTE — Telephone Encounter (Signed)
Mark with [employer] called for status of authorization for Saxenda. His number is [phone]. Patient reference number [id number].

## 2019-09-19 NOTE — Telephone Encounter (Signed)
PA already approved.   Note PA approved.    The request has been approved. The authorization is effective for a maximum of 4 fills from 09/10/2019 to 01/10/2020, as long as the member is enrolled in their current health plan. The request was approved as submitted. This request has been approved for 21mL (1 box containing 5 of the 3 mL pens) per 30 days. Renewal for Saxenda requires that the patient has achieved or maintained at least 4% weight loss of baseline body weight after 4 months of treatment. Please have the pharmacy contact [employer] Customer Service at [phone] if billing assistance is required. A written notification letter will follow with additional details.

## 2019-09-20 ENCOUNTER — Telehealth: Payer: No Typology Code available for payment source | Admitting: Nurse Practitioner

## 2019-09-20 DIAGNOSIS — N3000 Acute cystitis without hematuria: Secondary | ICD-10-CM

## 2019-09-20 MED ORDER — CEPHALEXIN 500 MG PO CAPS: 500.0000 mg | ORAL_CAPSULE | Freq: Two times a day (BID) | ORAL | 0 refills | Status: DC

## 2019-09-20 NOTE — Progress Notes (Signed)

## 2019-09-26 MED FILL — UNIFINE PENTIPS 31GX3/16: 31G X 5 MM | 90 days supply | Qty: 100 | Fill #0

## 2019-09-26 MED FILL — SAXENDA 18 MG/3 ML PEN: 18 | 44 days supply | Qty: 15 | Fill #0

## 2019-10-20 ENCOUNTER — Telehealth: Payer: No Typology Code available for payment source | Admitting: Physician Assistant

## 2019-10-20 DIAGNOSIS — R110 Nausea: Secondary | ICD-10-CM | POA: Diagnosis not present

## 2019-10-20 MED ORDER — ONDANSETRON HCL 4 MG PO TABS: 4.0000 mg | ORAL_TABLET | Freq: Three times a day (TID) | ORAL | 0 refills | Status: DC | PRN

## 2019-10-20 NOTE — Progress Notes (Signed)
Hi April Graham,   I am sorry you are not feeling well.  I will send in a prescription to help you with nausea.  Please be sure to follow up with Dr. Patsy Lager when you get back.    We are sorry that you are not feeling well. Here is how we plan to help!  Based on what you have shared with me it looks like you have a Virus that is irritating your GI tract.  Vomiting is the forceful emptying of a portion of the stomach's content through the mouth.  Although nausea and vomiting can make you feel miserable, it's important to remember that these are not diseases, but rather symptoms of an underlying illness.  When we treat short term symptoms, we always caution that any symptoms that persist should be fully evaluated in a medical office.  I have prescribed a medication that will help alleviate your symptoms and allow you to stay hydrated:  Zofran 4 mg 1 tablet every 8 hours as needed for nausea and vomiting  HOME CARE:  Drink clear liquids.  This is very important! Dehydration (the lack of fluid) can lead to a serious complication.  Start off with 1 tablespoon every 5 minutes for 8 hours.  You may begin eating bland foods after 8 hours without vomiting.  Start with saltine crackers, white bread, rice, mashed potatoes, applesauce.  After 48 hours on a bland diet, you may resume a normal diet.  Try to go to sleep.  Sleep often empties the stomach and relieves the need to vomit.  GET HELP RIGHT AWAY IF:   Your symptoms do not improve or worsen within 2 days after treatment.  You have a fever for over 3 days.  You cannot keep down fluids after trying the medication.  MAKE SURE YOU:   Understand these instructions.  Will watch your condition.  Will get help right away if you are not doing well or get worse.   Thank you for choosing an e-visit. Your e-visit answers were reviewed by a board certified advanced clinical practitioner to complete your personal care plan. Depending upon the  condition, your plan could have included both over the counter or prescription medications. Please review your pharmacy choice. Be sure that the pharmacy you have chosen is open so that you can pick up your prescription now.  If there is a problem you may message your provider in MyChart to have the prescription routed to another pharmacy. Your safety is important to [place]. If you have drug allergies check your prescription carefully.  For the next 24 hours, you can use MyChart to ask questions about today's visit, request a non-urgent call back, or ask for a work or school excuse from your e-visit provider. You will get an e-mail in the next two days asking about your experience. I hope that your e-visit has been valuable and will speed your recovery.   Greater than 5 minutes, yet less than 10 minutes of time have been spent researching, coordinating and implementing care for this patient today.

## 2019-11-26 IMAGING — MG DIGITAL DIAGNOSTIC BILATERAL MAMMOGRAM WITH TOMO AND CAD
6 of 10 series · 6 of 30 positions shown · non-contrast
Comparison: Prior studies including 03/05/2014

CLINICAL DATA: Patient reports for evaluation of a palpable
abnormality in the LEFT breast first noted in Saturday April, 2017. Mass
has gotten smaller but remains slightly tender. The patient has a
history of cysts.

EXAM:
DIGITAL DIAGNOSTIC BILATERAL MAMMOGRAM WITH CAD AND TOMO
ULTRASOUND LEFT BREAST

[L CC synth-2D]
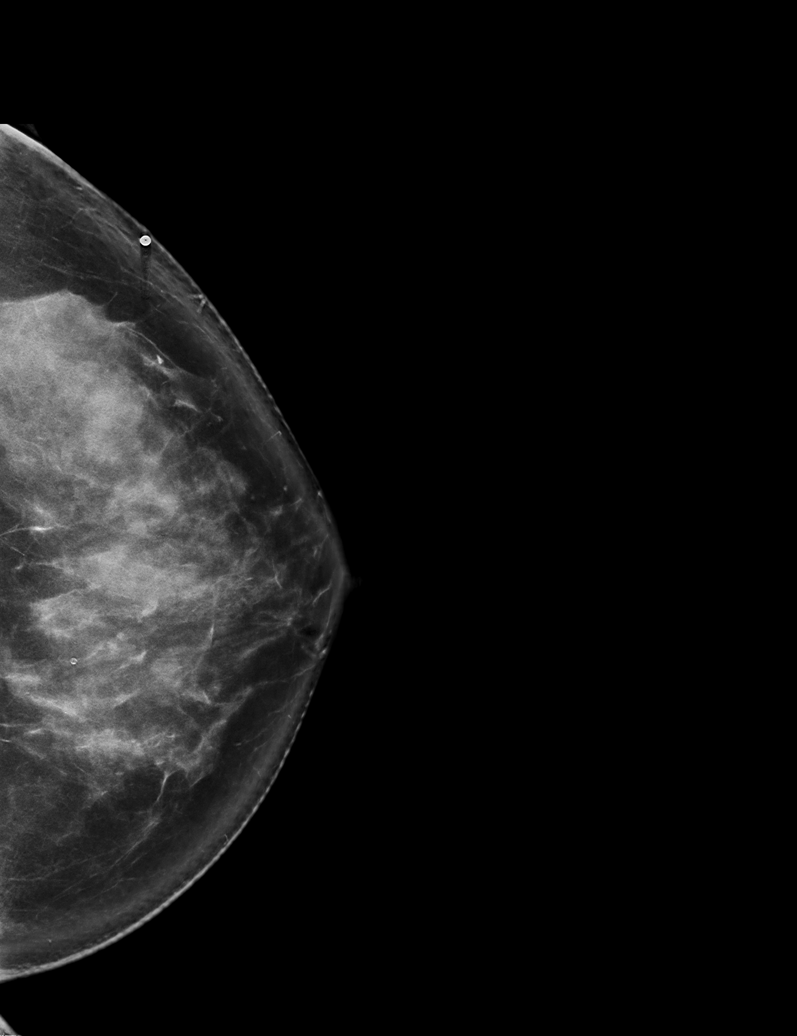

[L TAN synth-2D]
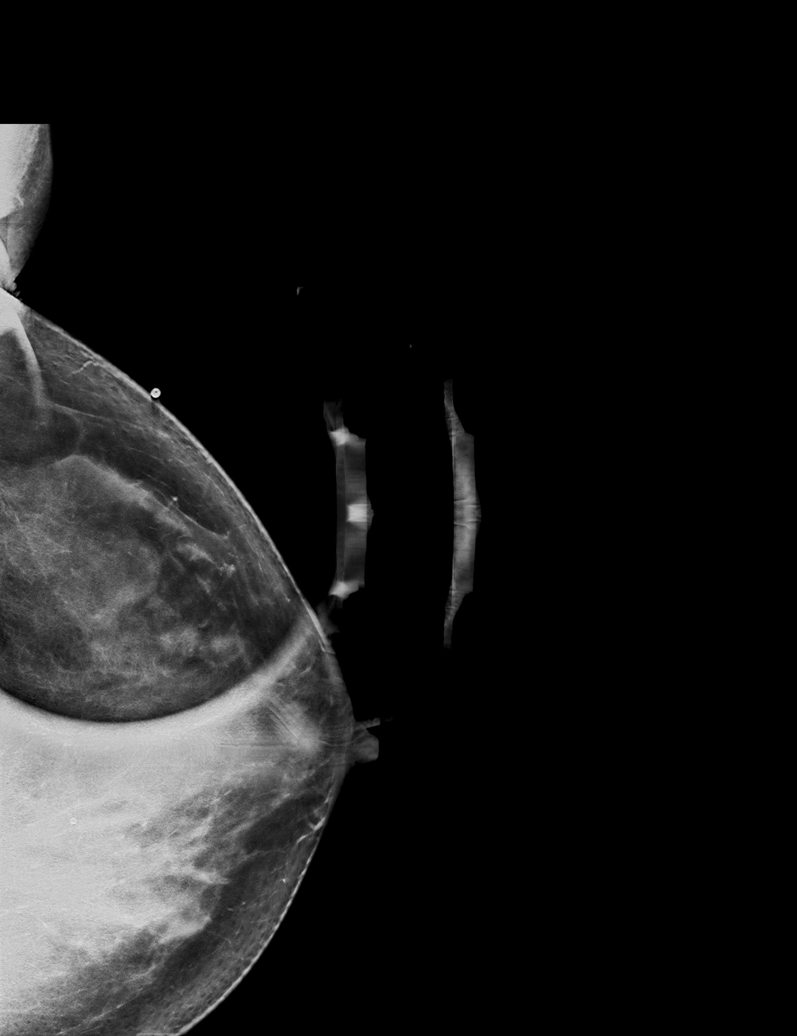

[L MLO synth-2D]
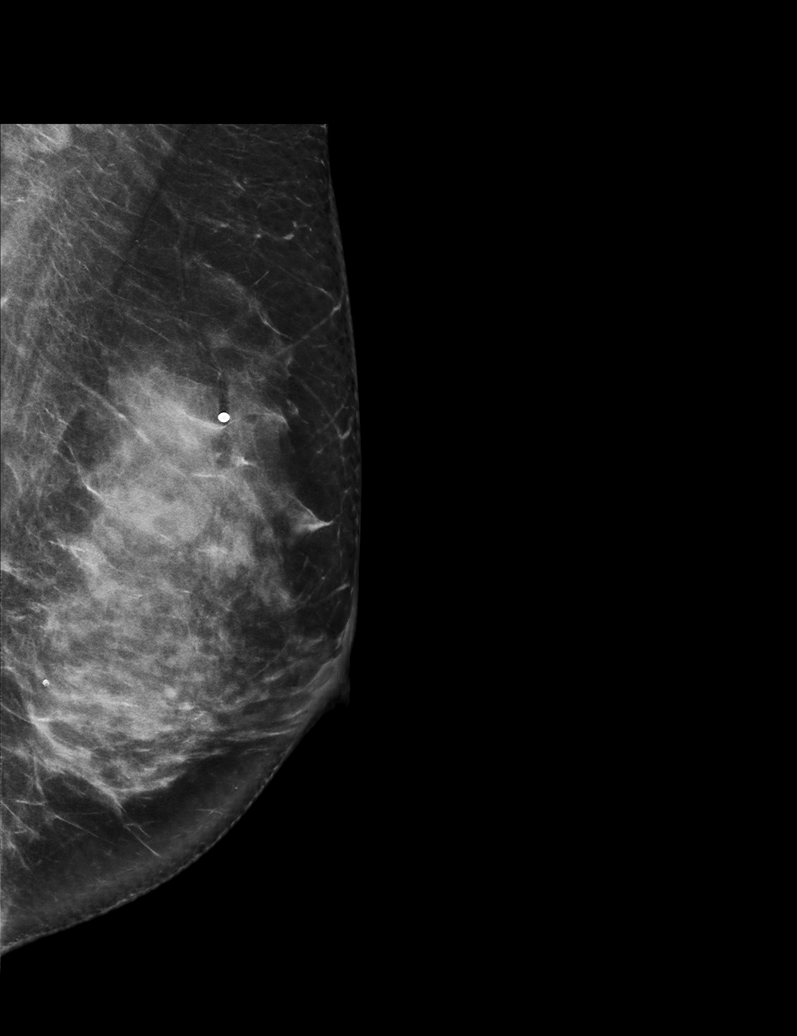

[R MLO synth-2D]
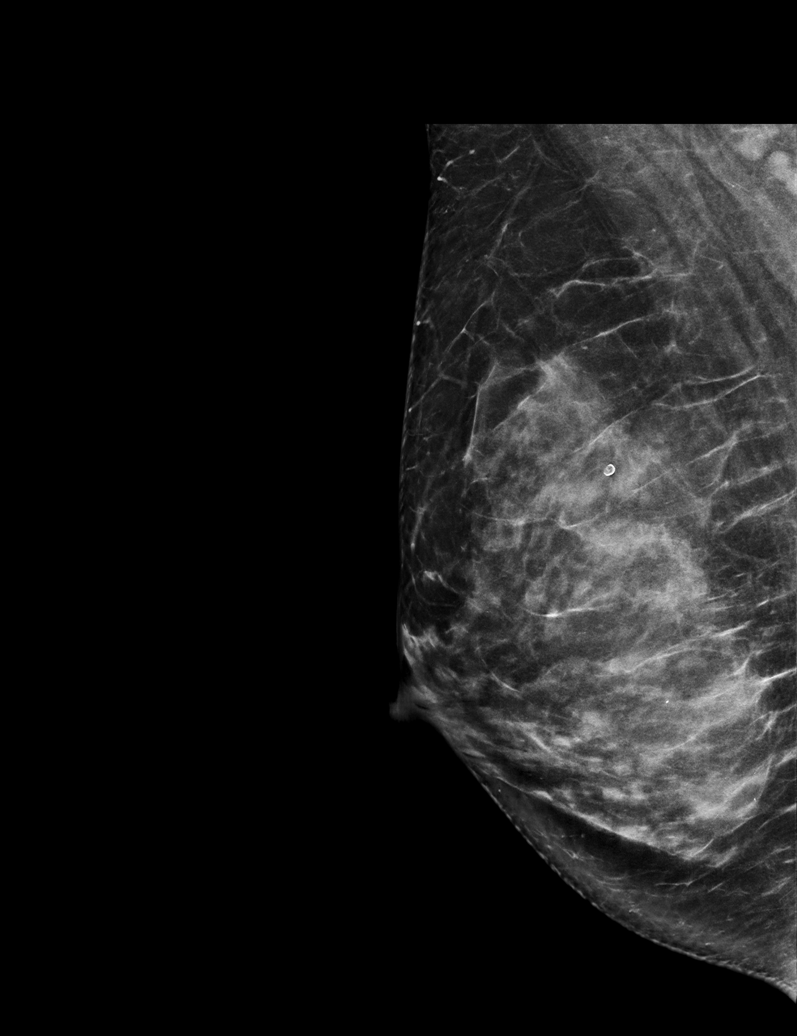

[R CC synth-2D]
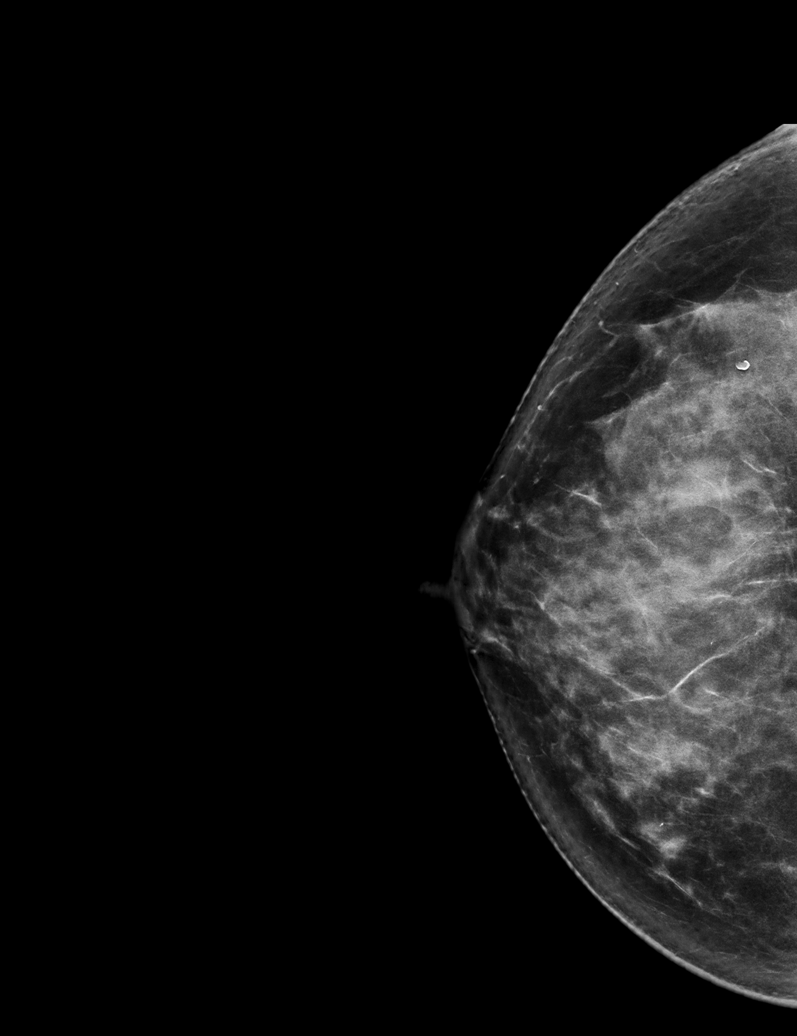

[R CC tomo · tomo slice 41/81.0]
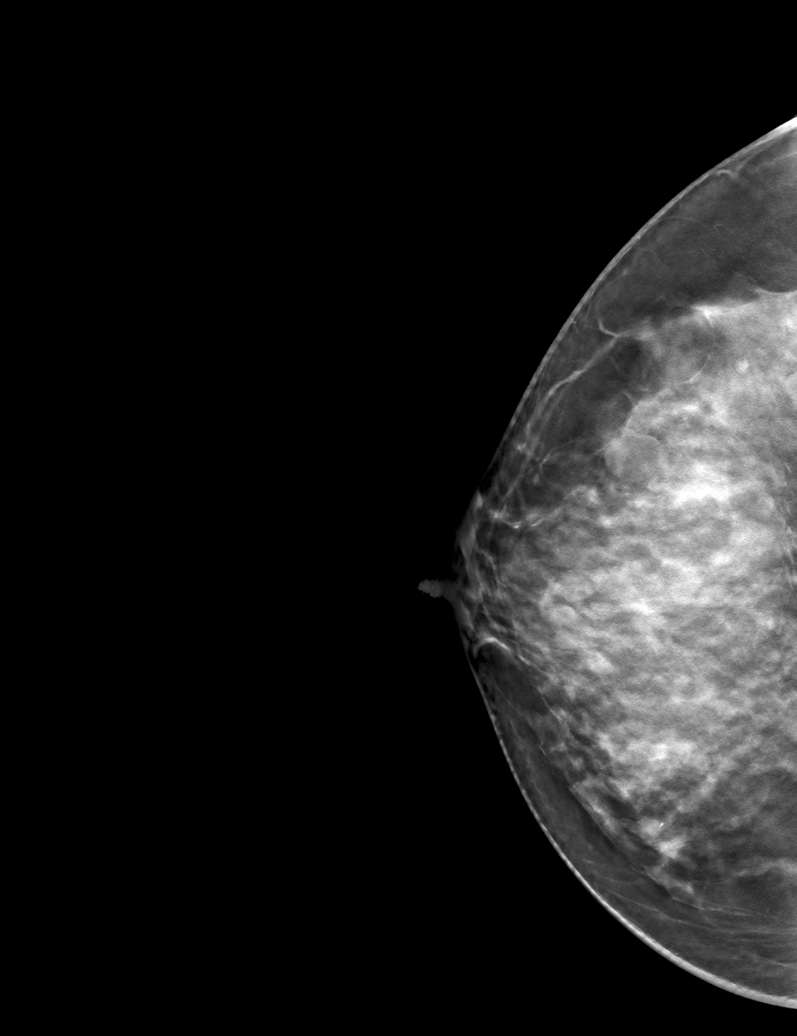

[6 of 30 positions shown; findings below may reference images not displayed]

ACR Breast Density Category d: The breast tissue is extremely dense,
which lowers the sensitivity of mammography.
FINDINGS: Multiple circumscribed oval partially obscured masses are identified
in the UPPER-OUTER QUADRANT of the LEFT breast in the area of
patient's concern.

Mammographic images were processed with CAD.

On physical exam, there are mobile discrete solid masses in the
UPPER-OUTER QUADRANT corresponding to the area of concern.

Targeted ultrasound is performed, showing numerous simple cysts in
the 2 o'clock location of the LEFT breast 5 centimeters from the
nipple. This area of cysts measures 3.7 x 1.0 x 3.5 centimeters. No
associated solid component or acoustic shadowing.
IMPRESSION: Palpable abnormality in the LEFT breast corresponds to multiple
benign cysts. The patient does not feel cyst aspiration is needed at
this time. Aspiration can be performed as needed for symptomatic
relief.

RECOMMENDATION:
Screening mammogram in one year.(Code:[id number])

I have discussed the findings and recommendations with the patient.
Results were also provided in writing at the conclusion of the
visit. If applicable, a reminder letter will be sent to the patient
regarding the next appointment.

BI-RADS CATEGORY  2: Benign.

## 2019-11-30 ENCOUNTER — Ambulatory Visit: Payer: No Typology Code available for payment source | Attending: Internal Medicine

## 2019-11-30 ENCOUNTER — Other Ambulatory Visit: Payer: Self-pay | Admitting: Internal Medicine

## 2019-11-30 DIAGNOSIS — Z23 Encounter for immunization: Secondary | ICD-10-CM

## 2019-11-30 NOTE — Progress Notes (Signed)
   Covid-19 Vaccination Clinic  Name:  SERAH NICOLETTI    MRN: [id number] DOB: 06-16-1980  11/30/2019  Ms. Oelkers was observed post Covid-19 immunization for 15 minutes without incident. She was provided with Vaccine Information Sheet and instruction to access the V-Safe system.   Ms. Lobo was instructed to call 911 with any severe reactions post vaccine: Marland Kitchen Difficulty breathing  . Swelling of face and throat  . A fast heartbeat  . A bad rash all over body  . Dizziness and weakness

## 2019-12-28 ENCOUNTER — Ambulatory Visit: Payer: No Typology Code available for payment source

## 2019-12-31 ENCOUNTER — Telehealth: Payer: Self-pay

## 2019-12-31 NOTE — Telephone Encounter (Signed)
Received PA request for Saxenda for continuing therapy. Pt has not been seen to be weighed since starting therapy. She will need an appt please. I'll hold PA for now.  

## 2020-01-01 NOTE — Telephone Encounter (Signed)
Sent patient mychart message

## 2020-04-12 ENCOUNTER — Telehealth: Payer: No Typology Code available for payment source | Admitting: Nurse Practitioner

## 2020-04-12 DIAGNOSIS — N3000 Acute cystitis without hematuria: Secondary | ICD-10-CM

## 2020-04-12 MED ORDER — CEPHALEXIN 500 MG PO CAPS: 500.0000 mg | ORAL_CAPSULE | Freq: Two times a day (BID) | ORAL | 0 refills | Status: DC

## 2020-04-12 MED ORDER — FLUCONAZOLE 150 MG PO TABS: 150.0000 mg | ORAL_TABLET | Freq: Once | ORAL | 0 refills | Status: AC

## 2020-04-12 NOTE — Progress Notes (Signed)
We are sorry that you are not feeling well.  Here is how we plan to help!  Based on what you shared with me it looks like you most likely have a simple urinary tract infection.  A UTI (Urinary Tract Infection) is a bacterial infection of the bladder.  Most cases of urinary tract infections are simple to treat but a key part of your care is to encourage you to drink plenty of fluids and watch your symptoms carefully. *we usually do not treat UTI with associated back pain in an evisit. We ususally recommend a urinalysis and urine culture cause may be kidney infection. Due to your circumstances I will go ahead and treat. If symptoms due not improve by late tomorrow evening you may want to go to urgent care before you get on ship.  I have prescribed Keflex 500 mg twice a day for 7 days. I also sent in a diflucan in case you get a yeast infection while on cruise. Your symptoms should gradually improve. Call us if the burning in your urine worsens, you develop worsening fever, back pain or pelvic pain or if your symptoms do not resolve after completing the antibiotic.  Urinary tract infections can be prevented by drinking plenty of water to keep your body hydrated.  Also be sure when you wipe, wipe from front to back and don't hold it in!  If possible, empty your bladder every 4 hours.  Your e-visit answers were reviewed by a board certified advanced clinical practitioner to complete your personal care plan.  Depending on the condition, your plan could have included both over the counter or prescription medications.  If there is a problem please reply  once you have received a response from your provider.  Your safety is important to [place].  If you have drug allergies check your prescription carefully.    You can use MyChart to ask questions about today's visit, request a non-urgent call back, or ask for a work or school excuse for 24 hours related to this e-Visit. If it has been greater than 24 hours you  will need to follow up with your provider, or enter a new e-Visit to address those concerns.   You will get an e-mail in the next two days asking about your experience.  I hope that your e-visit has been valuable and will speed your recovery. Thank you for using e-visits.  5-10 minutes spent reviewing and documenting in chart.

## 2020-07-08 ENCOUNTER — Encounter: Payer: Self-pay | Admitting: Family Medicine

## 2020-07-08 DIAGNOSIS — M25569 Pain in unspecified knee: Secondary | ICD-10-CM

## 2020-09-04 NOTE — Progress Notes (Addendum)
[facility] [address], [address] [place], [place] [address] [phone]  Date:  09/11/2020   Name:  April Graham   DOB:  1980/07/03   MRN:  [id number]  PCP:  Pearline Cables, MD    Chief Complaint: Annual Exam (Concerns/ questions: pt says her period is non existant, does she need to see gyn?/Tdap: pt says she thinks this is utd/Pap: due)   History of Present Illness:  April Graham is a 40 y.o. very pleasant female patient who presents with the following:  Pt seen today for a CPE- history of IBS and obesity  Last visit with myself about one year ago - at that time: At her visit 1 year ago, she had separated from her husband about 1 year prior- they are now divorced.  She noted that her eating pattern had not been quite as good since they separated.   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  She is also currently in graduate school, she finished up her program in December Her eating habits have been erratic, it is difficult to follow a healthy diet and exercise with her intense work schedule She has a BF and they are moving in soon- he has 4 children! 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  She wonders if  this is normal  She has started working out more and tying to eat better but still struggles with her weight Wt Readings from Last 3 Encounters:  09/11/20 273 lb 6.4 oz (124 kg)  09/05/19 268 lb (121.6 kg)  06/12/19 266 lb (120.7 kg)    There are no problems to display for this patient.   Past Medical History:  Diagnosis Date   Anxiety    Arthritis    Irritable bowel syndrome (IBS)     Past Surgical History:  Procedure Laterality Date   ADENOIDECTOMY     KNEE ARTHROSCOPY     KNEE ARTHROSCOPY W/ ACL RECONSTRUCTION     TONSILLECTOMY     WISDOM TOOTH EXTRACTION      Social History   Tobacco Use   Smoking status: Never   Smokeless tobacco: Never  Substance Use Topics   Alcohol use: Yes    Comment: social   Drug use: Never    Family History  Problem Relation Age of Onset   Arthritis Mother    Depression Mother    Diabetes Mother    High Cholesterol Mother    Transient ischemic attack Mother    Arthritis Father    Cancer Father    COPD Father  Diabetes Father    Hypertension Father    Cancer Maternal Grandfather    Cancer Paternal Grandmother     No Known Allergies  Medication list has been reviewed and updated.  Current Outpatient Medications on File Prior to Visit  Medication Sig Dispense Refill   LORazepam (ATIVAN) 1 MG tablet Take 0.5 tablets (0.5 mg total) by mouth as needed. May take twice daily for occasional anxiety 30 tablet 0   Norgestimate-Ethinyl Estradiol Triphasic (TRI-ESTARYLLA) 0.18/0.215/0.25 MG-35 MCG tablet Take 1 tablet by mouth daily. 28 tablet 7   No current facility-administered medications on file prior to visit.    Review of Systems:  As per HPI- otherwise negative.   Physical Examination: Vitals:   09/11/20 0921  BP: 110/80  Pulse: 81  Resp: 18  Temp: 98 F (36.7 C)  SpO2: 98%   Vitals:   09/11/20 0921  Weight: 273 lb 6.4 oz (124 kg)  Height: 6' (1.829 m)   Body mass index is 37.08 kg/m. Ideal Body Weight:  Weight in (lb) to have BMI = 25: 183.9  GEN: no acute distress.  Obese, looks well  HEENT: Atraumatic, Normocephalic. Bilateral TM wnl,   PEERL,EOMI.   Ears and Nose: No external deformity. CV: RRR, No M/G/R. No JVD. No thrill. No extra heart sounds. PULM: CTA B, no wheezes, crackles, rhonchi. No retractions. No resp. distress. No accessory muscle use. ABD: S, NT, ND, +BS. No rebound. No HSM. EXTR: No c/c/e PSYCH: Normally interactive. Conversant.  Breast: normal exam, no masses/ dimpling/ discharge Pelvic: normal, no vaginal lesions or discharge. Uterus normal, no CMT, no adnexal tendereness or masses   Assessment and Plan: Physical exam  Screening for deficiency anemia - Plan: CBC  Screening for thyroid disorder - Plan: TSH  Encounter for screening mammogram for malignant neoplasm of breast - Plan: MM 3D SCREEN BREAST BILATERAL  Screening for diabetes mellitus - Plan: Comprehensive metabolic panel, Hemoglobin A1c  Screening for hyperlipidemia - Plan: Lipid panel  Fatigue, unspecified type - Plan: VITAMIN D 25 Hydroxy (Vit-D Deficiency, Fractures)  Screening for cervical cancer - Plan: Cytology - PAP  Immunization due - Plan: Tdap vaccine greater than or equal to 7yo IM  Obesity (BMI 35.0-39.9 without comorbidity) - Plan: Liraglutide -Weight Management (SAXENDA) 18 MG/3ML SOPN  Morbid obesity (HCC) - Plan: Insulin Pen Needle (PEN NEEDLES) 31G X 5 MM MISC   Physical exam today Encouraged healthy diet and exercise routine Refilled Saxenda.  She denies any contraindications.  Went over how to titrate this medication which she has used before We discussed her lessened menstrual bleeding.  Offered to have her see GYN, she declines for now.  Advised that most likely this is simply due to extended use of birth control pills.  If she likes, she could come off her pills for a few months and see how she responds.  Her current partner has had vasectomy so pregnancy is not a major  concern.  She declines the time being but will keep this in mind  Will plan further follow- up pending labs.  This visit occurred during the SARS-CoV-2 public health emergency.  Safety protocols were in place, including screening questions prior to the visit, additional usage of staff PPE, and extensive cleaning of exam room while observing appropriate contact time as indicated for disinfecting solutions.   Signed Abbe Amsterdam, MD  Received her labs as below, message to patient  Results for orders placed or performed in visit on 09/11/20  CBC  Result  Value Ref Range   WBC 7.0 4.0 - 10.5 K/uL   RBC 4.24 3.87 - 5.11 Mil/uL   Platelets 239.0 150.0 - 400.0 K/uL   Hemoglobin 13.7 12.0 - 15.0 g/dL   HCT 25.3 66.4 - 40.3 %   MCV 96.5 78.0 - 100.0 fl   MCHC 33.4 30.0 - 36.0 g/dL   RDW 47.4 25.9 - 56.3 %  Comprehensive metabolic panel  Result Value Ref Range   Sodium 138 135 - 145 mEq/L   Potassium 4.4 3.5 - 5.1 mEq/L   Chloride 107 96 - 112 mEq/L   CO2 24 19 - 32 mEq/L   Glucose, Bld 92 70 - 99 mg/dL   BUN 15 6 - 23 mg/dL   Creatinine, Ser 8.75 0.40 - 1.20 mg/dL   Total Bilirubin 0.5 0.2 - 1.2 mg/dL   Alkaline Phosphatase 45 39 - 117 U/L   AST 16 0 - 37 U/L   ALT 14 0 - 35 U/L   Total Protein 6.7 6.0 - 8.3 g/dL   Albumin 4.2 3.5 - 5.2 g/dL   GFR 64.33 >29.51 mL/min   Calcium 8.7 8.4 - 10.5 mg/dL  Hemoglobin O8C  Result Value Ref Range   Hgb A1c MFr Bld 5.0 4.6 - 6.5 %  Lipid panel  Result Value Ref Range   Cholesterol 176 0 - 200 mg/dL   Triglycerides 16.6 0.0 - 149.0 mg/dL   HDL 06.30 >16.01 mg/dL   VLDL 09.3 0.0 - 23.5 mg/dL   LDL Cholesterol 89 0 - 99 mg/dL   Total CHOL/HDL Ratio 3    NonHDL 108.19   TSH  Result Value Ref Range   TSH 0.83 0.35 - 5.50 uIU/mL  VITAMIN D 25 Hydroxy (Vit-D Deficiency, Fractures)  Result Value Ref Range   VITD 46.04 30.00 - 100.00 ng/mL   Addendum 7/19, received her Pap as below  High risk HPV Negative   Adequacy Satisfactory for  evaluation; transformation zone component ABSENT.   Diagnosis - Negative for Intraepithelial Lesions or Malignancy (NILM)   Diagnosis - Benign reactive/reparative changes   Microorganisms Shift in flora suggestive of bacterial vaginosis   Comment Cellular changes consistent with hyperkeratosis.   Comment Normal Reference Range HPV - Negative

## 2020-09-10 ENCOUNTER — Other Ambulatory Visit: Payer: Self-pay | Admitting: Family Medicine

## 2020-09-10 DIAGNOSIS — Z3041 Encounter for surveillance of contraceptive pills: Secondary | ICD-10-CM

## 2020-09-10 MED ORDER — NORGESTIM-ETH ESTRAD TRIPHASIC 0.18/0.215/0.25 MG-35 MCG PO TABS: 1.0000 | ORAL_TABLET | Freq: Every day | ORAL | 7 refills | Status: DC

## 2020-09-10 NOTE — Addendum Note (Signed)
Addended by: Maximino Sarin on: 09/10/2020 01:40 PM   Modules accepted: Orders

## 2020-09-11 ENCOUNTER — Other Ambulatory Visit: Payer: Self-pay

## 2020-09-11 ENCOUNTER — Other Ambulatory Visit (HOSPITAL_COMMUNITY)
Admission: RE | Admit: 2020-09-11 | Discharge: 2020-09-11 | Disposition: A | Discharge status: Home / Self Care | Payer: No Typology Code available for payment source | Source: Ambulatory Visit | Attending: Family Medicine | Admitting: Family Medicine

## 2020-09-11 ENCOUNTER — Encounter: Payer: Self-pay | Admitting: Family Medicine

## 2020-09-11 ENCOUNTER — Ambulatory Visit (INDEPENDENT_AMBULATORY_CARE_PROVIDER_SITE_OTHER): Payer: No Typology Code available for payment source | Admitting: Family Medicine

## 2020-09-11 VITALS — BP 110/80 | HR 81 | Temp 98.0°F | Resp 18 | Ht 72.0 in | Wt 273.4 lb

## 2020-09-11 DIAGNOSIS — Z130 Encounter for screening for diseases of the blood and blood-forming organs and certain disorders involving the immune mechanism: Secondary | ICD-10-CM | POA: Diagnosis not present

## 2020-09-11 DIAGNOSIS — Z1231 Encounter for screening mammogram for malignant neoplasm of breast: Secondary | ICD-10-CM | POA: Diagnosis not present

## 2020-09-11 DIAGNOSIS — Z1329 Encounter for screening for other suspected endocrine disorder: Secondary | ICD-10-CM

## 2020-09-11 DIAGNOSIS — Z13220 Encounter for screening for lipoid disorders: Secondary | ICD-10-CM

## 2020-09-11 DIAGNOSIS — Z23 Encounter for immunization: Secondary | ICD-10-CM | POA: Diagnosis not present

## 2020-09-11 DIAGNOSIS — Z0000 Encounter for general adult medical examination without abnormal findings: Secondary | ICD-10-CM

## 2020-09-11 DIAGNOSIS — Z124 Encounter for screening for malignant neoplasm of cervix: Secondary | ICD-10-CM | POA: Insufficient documentation

## 2020-09-11 DIAGNOSIS — Z131 Encounter for screening for diabetes mellitus: Secondary | ICD-10-CM

## 2020-09-11 DIAGNOSIS — E669 Obesity, unspecified: Secondary | ICD-10-CM

## 2020-09-11 DIAGNOSIS — R5383 Other fatigue: Secondary | ICD-10-CM | POA: Diagnosis not present

## 2020-09-11 LAB — HEMOGLOBIN A1C: Hgb A1c MFr Bld: 5 % (ref 4.6–6.5)

## 2020-09-11 LAB — CBC
HCT: 41 % (ref 36.0–46.0)
Hemoglobin: 13.7 g/dL (ref 12.0–15.0)
MCHC: 33.4 g/dL (ref 30.0–36.0)
MCV: 96.5 fl (ref 78.0–100.0)
Platelets: 239 10*3/uL (ref 150.0–400.0)
RBC: 4.24 Mil/uL (ref 3.87–5.11)
RDW: 13.5 % (ref 11.5–15.5)
WBC: 7 10*3/uL (ref 4.0–10.5)

## 2020-09-11 LAB — VITAMIN D 25 HYDROXY (VIT D DEFICIENCY, FRACTURES): VITD: 46.04 ng/mL (ref 30.00–100.00)

## 2020-09-11 LAB — LIPID PANEL
Cholesterol: 176 mg/dL (ref 0–200)
HDL: 67.5 mg/dL (ref >39.00)
LDL Cholesterol: 89 mg/dL (ref 0–99)
NonHDL: 108.19
Total CHOL/HDL Ratio: 3
Triglycerides: 94 mg/dL (ref 0.0–149.0)
VLDL: 18.8 mg/dL (ref 0.0–40.0)

## 2020-09-11 LAB — COMPREHENSIVE METABOLIC PANEL
ALT: 14 U/L (ref 0–35)
AST: 16 U/L (ref 0–37)
Albumin: 4.2 g/dL (ref 3.5–5.2)
Alkaline Phosphatase: 45 U/L (ref 39–117)
BUN: 15 mg/dL (ref 6–23)
CO2: 24 mEq/L (ref 19–32)
Calcium: 8.7 mg/dL (ref 8.4–10.5)
Chloride: 107 mEq/L (ref 96–112)
Creatinine, Ser: 0.76 mg/dL (ref 0.40–1.20)
GFR: 97.33 mL/min (ref >60.00)
Glucose, Bld: 92 mg/dL (ref 70–99)
Potassium: 4.4 mEq/L (ref 3.5–5.1)
Sodium: 138 mEq/L (ref 135–145)
Total Bilirubin: 0.5 mg/dL (ref 0.2–1.2)
Total Protein: 6.7 g/dL (ref 6.0–8.3)

## 2020-09-11 LAB — TSH: TSH: 0.83 u[IU]/mL (ref 0.35–5.50)

## 2020-09-11 MED ORDER — SAXENDA 18 MG/3ML ~~LOC~~ SOPN: PEN_INJECTOR | SUBCUTANEOUS | 3 refills | Status: DC

## 2020-09-11 MED ORDER — PEN NEEDLES 31G X 5 MM MISC: 1.0000 | Freq: Every day | 99 refills | Status: DC

## 2020-09-11 NOTE — Patient Instructions (Addendum)
It was good to see you again today! I will be in touch with your labs Please schedule your mammogram through the [facility] at your convenience   Address: [address], [place], [place] [address] Phone: [phone]  Ok to start back on saxenda- let me know if you need a needle rx  Start with 0.6 mg daily- increase by 0.6 mg increments weekly to goal dose of 3 mg

## 2020-09-16 ENCOUNTER — Encounter: Payer: Self-pay | Admitting: Family Medicine

## 2020-09-16 LAB — CYTOLOGY - PAP
Adequacy: ABSENT
Comment: NEGATIVE
Diagnosis: NEGATIVE
Diagnosis: REACTIVE
High risk HPV: NEGATIVE

## 2020-09-17 MED ORDER — METRONIDAZOLE 500 MG PO TABS: 500.0000 mg | ORAL_TABLET | Freq: Two times a day (BID) | ORAL | 0 refills | Status: DC

## 2020-09-18 ENCOUNTER — Telehealth: Payer: Self-pay

## 2020-09-18 NOTE — Telephone Encounter (Signed)
PA initiated via cover my meds for patients Saxenda.   Waiting on determination.   KEY: [id number]

## 2020-09-25 NOTE — Telephone Encounter (Signed)
PA for Saxenda 18 mg/3 ml pen has been approved via med-impact. Max 4 fills from 09/24/20 to 01/25/21.

## 2020-10-10 NOTE — Progress Notes (Signed)
[facility] [address], [address] [place], [place] [address] [phone]  Date:  10/13/2020   Name:  April Graham   DOB:  07/29/1980   MRN:  [id number]  PCP:  Pearline Cables, MD    Chief Complaint: Cyst (Left side of back , onset: 5 days , redness and warmth)   History of Present Illness:  April Graham is a 39 y.o. very pleasant female patient who presents with the following:  Patient today with concern of possible abscess History of IBS and obesity Last seen by myself for physical in July  She has noted an apparent sebaceous cyst on her left flank for years, but has never really been bothersome.  Periodically she might express some sebaceous material.  Over the last week or so it has become larger, tender and inflamed.  She thinks it became infected during a beach trip.  Otherwise she feels well, no fever or chills.  No current pregnancy There are no problems to display for this patient.   Past Medical History:  Diagnosis Date   Anxiety    Arthritis    Irritable bowel syndrome (IBS)     Past Surgical History:  Procedure Laterality Date   ADENOIDECTOMY     KNEE ARTHROSCOPY     KNEE ARTHROSCOPY W/ ACL RECONSTRUCTION     TONSILLECTOMY     WISDOM TOOTH EXTRACTION      Social History   Tobacco Use   Smoking status: Never   Smokeless tobacco: Never  Substance Use Topics   Alcohol use: Yes    Comment: social   Drug use: Never    Family History  Problem Relation Age of Onset   Arthritis Mother    Depression Mother    Diabetes Mother    High Cholesterol Mother    Transient ischemic attack Mother    Arthritis Father    Cancer Father    COPD Father    Diabetes Father    Hypertension Father    Cancer Maternal Grandfather    Cancer Paternal Grandmother     No Known Allergies  Medication list has been reviewed and updated.  Current Outpatient Medications on File Prior to Visit  Medication Sig  Dispense Refill   Insulin Pen Needle (PEN NEEDLES) 31G X 5 MM MISC 1 each by Does not apply route daily. 100 each prn   Liraglutide -Weight Management (SAXENDA) 18 MG/3ML SOPN Start with 0.6 mg [place] daily- increase by 0.6 mg weekly to goal dose of 3mg  12 mL 3   LORazepam (ATIVAN) 1 MG tablet Take 0.5 tablets (0.5 mg total) by mouth as needed. May take twice daily for occasional anxiety 30 tablet 0   metroNIDAZOLE (FLAGYL) 500 MG tablet Take 1 tablet (500 mg total) by mouth 2 (two) times daily. Take 1 pill twice daily for one week. NO alcohol 14 tablet 0   Norgestimate-Ethinyl Estradiol Triphasic (TRI-ESTARYLLA) 0.18/0.215/0.25 MG-35 MCG tablet Take 1 tablet by mouth daily. 28 tablet 7   No current facility-administered medications on file prior to visit.    Review of Systems:  As per HPI- otherwise negative.   Physical Examination: Vitals:   10/13/20 1437  BP: 130/74  Pulse: 95  Resp: 20  Temp: 97.9 F (36.6 C)  SpO2: 98%   Vitals:   10/13/20 1437  Weight: 271 lb (122.9 kg)  Height: 6' (1.829 m)   Body mass index is 36.75 kg/m. Ideal  Body Weight: Weight in (lb) to have BMI = 25: 183.9  GEN: no acute distress.  Tall build, obese.  Looks well HEENT: Atraumatic, Normocephalic.  Ears and Nose: No external deformity. EXTR: No c/c/e PSYCH: Normally interactive. Conversant.  Her left lower back/flank area displays an infected sebaceous cyst.  There is a visible pore but I am not able to express any material.  The surrounding area is red and inflamed.  Inflamed area diameter about 2 inches  Verbal consent obtained.  Area prepped with Betadine.  Used approximately 2 cc of 1% lidocaine with epi for anesthesia.  Incised over sebaceous cyst with an 11 blade, expressed pus and sebaceous material.  Placed dressing.  Estimated blood loss 2 mL.  Patient tolerated well with no immediate complications   Assessment and Plan: Infected sebaceous cyst of skin - Plan: cephALEXin (KEFLEX) 500 MG  capsule Patient here today with infected sebaceous cyst on her lower back.  Incised and drained as above, gave wound care instructions.  We will treat with Keflex for 1 week due to inflammation and infection around the cyst.  She will follow-up with me if needed This visit occurred during the SARS-CoV-2 public health emergency.  Safety protocols were in place, including screening questions prior to the visit, additional usage of staff PPE, and extensive cleaning of exam room while observing appropriate contact time as indicated for disinfecting solutions.   Signed Abbe Amsterdam, MD

## 2020-10-13 ENCOUNTER — Ambulatory Visit (INDEPENDENT_AMBULATORY_CARE_PROVIDER_SITE_OTHER): Payer: No Typology Code available for payment source | Admitting: Family Medicine

## 2020-10-13 ENCOUNTER — Other Ambulatory Visit: Payer: Self-pay

## 2020-10-13 VITALS — BP 130/74 | HR 95 | Temp 97.9°F | Resp 20 | Ht 72.0 in | Wt 271.0 lb

## 2020-10-13 DIAGNOSIS — L723 Sebaceous cyst: Secondary | ICD-10-CM

## 2020-10-13 DIAGNOSIS — L089 Local infection of the skin and subcutaneous tissue, unspecified: Secondary | ICD-10-CM | POA: Diagnosis not present

## 2020-10-13 MED ORDER — CEPHALEXIN 500 MG PO CAPS: 500.0000 mg | ORAL_CAPSULE | Freq: Three times a day (TID) | ORAL | 0 refills | Status: DC

## 2020-10-13 NOTE — Patient Instructions (Signed)
Good to see you!  We drained an infected sebaceous cyst on your back today Keep dressing on today- ok to change if dirty or bloody Tomorrow shower and wash with soap and water- squeeze gently to remove any more sebaceous material Keep covered until healed over/ not draining  Let me know if you need anything- I can look at it Wednesday if you need me to!   Keflex antibiotic for a week If bleeding apply pressure until stopped- if not stopping in 15 minutes seek medica attention Let me know if infection is not clearing

## 2020-11-07 ENCOUNTER — Other Ambulatory Visit: Payer: Self-pay | Admitting: Family Medicine

## 2020-11-07 ENCOUNTER — Other Ambulatory Visit: Payer: Self-pay

## 2020-11-07 DIAGNOSIS — F418 Other specified anxiety disorders: Secondary | ICD-10-CM

## 2020-11-07 MED ORDER — LORAZEPAM 1 MG PO TABS
0.5000 mg | ORAL_TABLET | ORAL | 0 refills | Status: AC | PRN
Start: 2020-11-07 — End: ?
  Filled 2020-11-07: qty 30, 30d supply, fill #0

## 2020-11-21 ENCOUNTER — Encounter: Payer: Self-pay | Admitting: Family Medicine

## 2020-11-21 DIAGNOSIS — G8929 Other chronic pain: Secondary | ICD-10-CM

## 2020-11-21 DIAGNOSIS — M25561 Pain in right knee: Secondary | ICD-10-CM

## 2021-04-07 ENCOUNTER — Ambulatory Visit (HOSPITAL_COMMUNITY)
Admission: EM | Admit: 2021-04-07 | Discharge: 2021-04-07 | Disposition: A | Discharge status: Home / Self Care | Payer: No Typology Code available for payment source | Attending: Family Medicine | Admitting: Family Medicine

## 2021-04-07 ENCOUNTER — Ambulatory Visit (INDEPENDENT_AMBULATORY_CARE_PROVIDER_SITE_OTHER): Payer: No Typology Code available for payment source

## 2021-04-07 ENCOUNTER — Encounter (HOSPITAL_COMMUNITY): Payer: Self-pay

## 2021-04-07 ENCOUNTER — Other Ambulatory Visit: Payer: Self-pay

## 2021-04-07 DIAGNOSIS — R079 Chest pain, unspecified: Secondary | ICD-10-CM

## 2021-04-07 DIAGNOSIS — R0789 Other chest pain: Secondary | ICD-10-CM

## 2021-04-07 MED ORDER — NITROGLYCERIN 0.4 MG SL SUBL: 0.4000 mg | SUBLINGUAL_TABLET | SUBLINGUAL | 0 refills | Status: DC | PRN

## 2021-04-07 NOTE — Discharge Instructions (Addendum)
Your EKG was normal Your chest x-ray was read as negative for any problem. You can try taking a nitroglycerin pill under your tongue to see if it helps any of your symptoms when your pain is worse.

## 2021-04-07 NOTE — Patient Instructions (Addendum)
It was nice to see you again today! I will be in touch with your labs asap- if your D dimer is positive we will proceed to CT angiogram If your troponin is positive we will have you go to the ER!   Assuming all labs are ok we will probably do a Zio patch heart monitor and/ or a treadmill test If you are getting worse or have any other concerns in the meantime please let me know!

## 2021-04-07 NOTE — ED Triage Notes (Signed)
Pt c/o lt shoulder pain since Saturday night. Denies injury. States radiates across upper back into chest. States pain worse when lying down and becomes tachycardiac. Denies SOB or diaphoresis.

## 2021-04-07 NOTE — Progress Notes (Addendum)
[facility] [address], [address] [place], [place] [address] [phone]  Date:  04/08/2021   Name:  April Graham   DOB:  1980-11-24   MRN:  [id number]  PCP:  Pearline Cables, MD    Chief Complaint: Follow-up (Here for ER follow up- seen last night for chest pain was given Nitroglycerin to take as needed. )   History of Present Illness:  April Graham is a 41 y.o. very pleasant female patient who presents with the following:  Patient seen today with concern of anxiety and tachycardia History of IBS, obesity  Most recent visit with myself was in August for sebaceous cyst She was seen at urgent care on 2/7-yesterday- with concern of atypical chest pain EKG, chest x-ray normal.  She was released to home-they did give her nitroglycerin to use for chest pain; she decided not to take this and I agree with not using this medication   Pt has noted waking up at night with tachycardia intermittently for the last 3 months.  May occur 1-2x a week.  She may use the ativan she has on hand, it is sometimes helpful  Last week she awoke in the middle of the night with pain in her left upper back, radiating into her chest, and tachycardia to about 130 BPM.  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  She noted heart rates to about 120 and was not feeling very well Yesterday she felt terrible, she went to UC and was evaluated as above She does note more tachycardia if she is being active She does not really feel SOB except when her shoulder is hurting  We discussed her situation.  She does not have chest pain currently but notes that her left shoulder was hurting earlier today.  I offered to have her seen in the ER where she could have more than 1 set of cardiac enzymes-for the time being she declines  She does  want to look at possibility of pulmonary embolism.  She has never had a blood clot before, though she does use oral contraceptive pills.  She is not a smoker.  Most recent COVID-19 infection was last summer Flu vaccine-done COVID booster-not done yet There are no problems to display for this patient.   Past Medical History:  Diagnosis Date   Anxiety    Arthritis    Irritable bowel syndrome (IBS)     Past Surgical History:  Procedure Laterality Date   ADENOIDECTOMY     KNEE ARTHROSCOPY     KNEE ARTHROSCOPY W/ ACL RECONSTRUCTION     TONSILLECTOMY     WISDOM TOOTH EXTRACTION      Social History   Tobacco Use   Smoking status: Never   Smokeless tobacco: Never  Substance Use Topics   Alcohol use: Yes    Comment: social   Drug use: Never    Family History  Problem Relation Age of Onset   Arthritis Mother    Depression Mother    Diabetes Mother    High Cholesterol Mother    Transient ischemic attack Mother    Arthritis Father    Cancer Father    COPD Father    Diabetes Father    Hypertension Father    Cancer  Maternal Grandfather    Cancer Paternal Grandmother     No Known Allergies  Medication list has been reviewed and updated.  Current Outpatient Medications on File Prior to Visit  Medication Sig Dispense Refill   LORazepam (ATIVAN) 1 MG tablet Take 0.5 tablets (0.5 mg total) by mouth as needed. May take twice daily for occasional anxiety 30 tablet 0   Norgestimate-Ethinyl Estradiol Triphasic (TRI-ESTARYLLA) 0.18/0.215/0.25 MG-35 MCG tablet Take 1 tablet by mouth daily. 28 tablet 7   No current facility-administered medications on file prior to visit.    Review of Systems:  As per HPI- otherwise negative.   Physical Examination: Vitals:   04/08/21 0842  BP: 130/80  Pulse: 92  Resp: 16  Temp: 98.2 F (36.8 C)  SpO2: 99%   Vitals:   04/08/21 0842  Weight: 277 lb (125.6 kg)  Height: 6' (1.829 m)   Body mass index is 37.57 kg/m. Ideal Body  Weight: Weight in (lb) to have BMI = 25: 183.9  GEN: no acute distress.  Looks well, obese/tall build HEENT: Atraumatic, Normocephalic.  Ears and Nose: No external deformity. CV: RRR, No M/G/R. No JVD. No thrill. No extra heart sounds. PULM: CTA B, no wheezes, crackles, rhonchi. No retractions. No resp. distress. No accessory muscle use. ABD: S, NT, ND, +BS. No rebound. No HSM. EXTR: No c/c/e PSYCH: Normally interactive. Conversant.  I am not able to reproduce her discomfort by manipulating her left shoulder or pressing on her left upper back  Results for orders placed or performed in visit on 04/08/21  CBC  Result Value Ref Range   WBC 6.6 4.0 - 10.5 K/uL   RBC 4.24 3.87 - 5.11 Mil/uL   Platelets 261.0 150.0 - 400.0 K/uL   Hemoglobin 13.3 12.0 - 15.0 g/dL   HCT 55.7 32.2 - 02.5 %   MCV 95.7 78.0 - 100.0 fl   MCHC 32.7 30.0 - 36.0 g/dL   RDW 42.7 06.2 - 37.6 %  Comprehensive metabolic panel  Result Value Ref Range   Sodium 141 135 - 145 mEq/L   Potassium 4.3 3.5 - 5.1 mEq/L   Chloride 108 96 - 112 mEq/L   CO2 24 19 - 32 mEq/L   Glucose, Bld 92 70 - 99 mg/dL   BUN 13 6 - 23 mg/dL   Creatinine, Ser 2.83 0.40 - 1.20 mg/dL   Total Bilirubin 0.4 0.2 - 1.2 mg/dL   Alkaline Phosphatase 45 39 - 117 U/L   AST 13 0 - 37 U/L   ALT 12 0 - 35 U/L   Total Protein 6.4 6.0 - 8.3 g/dL   Albumin 3.9 3.5 - 5.2 g/dL   GFR 15.17 >61.60 mL/min   Calcium 8.7 8.4 - 10.5 mg/dL  TSH  Result Value Ref Range   TSH 1.10 0.35 - 5.50 uIU/mL  D-Dimer, Quantitative  Result Value Ref Range   D-Dimer, Quant 0.28 <0.50 mcg/mL FEU  T3, free  Result Value Ref Range   T3, Free 3.8 2.3 - 4.2 pg/mL  POCT urine pregnancy  Result Value Ref Range   Preg Test, Ur Negative Negative  Troponin I (High Sensitivity)  Result Value Ref Range   High Sens Troponin I 4 2 - 17 ng/L    Assessment and Plan: Tachycardia - Plan: CBC, Comprehensive metabolic panel, TSH, D-Dimer, Quantitative, Troponin I (High  Sensitivity), T3, free, propranolol (INDERAL) 10 MG tablet, POCT urine pregnancy  Chest tightness - Plan: CBC, Comprehensive metabolic panel, TSH, D-Dimer, Quantitative, Troponin  I (High Sensitivity), T3, free  Patient seen today with concern of intermittent tachycardia and also chest/left shoulder discomfort.  She has noted tachycardia for several months, occurs most frequently at night. More recently she has noted discomfort in her left shoulder.  She was seen yesterday and had an EKG and chest x-ray, both normal  Today she declines ER consultation.  We will check a stat troponin and D-dimer.  If either of these are positive we will respond appropriately Pregnancy test done in case CT angiogram is needed We will also rule out anemia, hyperthyroidism If all lab work is normal we plan to do a Zio patch and potentially also a treadmill stress test I did give her propranolol that she can use as needed for tachycardia.  She notes tachycardia most frequently occurs during the night, she may try taking a dose just before bed Signed Abbe Amsterdam, MD  Received labs as below, message to patient Results for orders placed or performed in visit on 04/08/21  CBC  Result Value Ref Range   WBC 6.6 4.0 - 10.5 K/uL   RBC 4.24 3.87 - 5.11 Mil/uL   Platelets 261.0 150.0 - 400.0 K/uL   Hemoglobin 13.3 12.0 - 15.0 g/dL   HCT 76.7 34.1 - 93.7 %   MCV 95.7 78.0 - 100.0 fl   MCHC 32.7 30.0 - 36.0 g/dL   RDW 90.2 40.9 - 73.5 %  Comprehensive metabolic panel  Result Value Ref Range   Sodium 141 135 - 145 mEq/L   Potassium 4.3 3.5 - 5.1 mEq/L   Chloride 108 96 - 112 mEq/L   CO2 24 19 - 32 mEq/L   Glucose, Bld 92 70 - 99 mg/dL   BUN 13 6 - 23 mg/dL   Creatinine, Ser 3.29 0.40 - 1.20 mg/dL   Total Bilirubin 0.4 0.2 - 1.2 mg/dL   Alkaline Phosphatase 45 39 - 117 U/L   AST 13 0 - 37 U/L   ALT 12 0 - 35 U/L   Total Protein 6.4 6.0 - 8.3 g/dL   Albumin 3.9 3.5 - 5.2 g/dL   GFR 92.42 >68.34 mL/min    Calcium 8.7 8.4 - 10.5 mg/dL  TSH  Result Value Ref Range   TSH 1.10 0.35 - 5.50 uIU/mL  D-Dimer, Quantitative  Result Value Ref Range   D-Dimer, Quant 0.28 <0.50 mcg/mL FEU  T3, free  Result Value Ref Range   T3, Free 3.8 2.3 - 4.2 pg/mL  POCT urine pregnancy  Result Value Ref Range   Preg Test, Ur Negative Negative  Troponin I (High Sensitivity)  Result Value Ref Range   High Sens Troponin I 4 2 - 17 ng/L   Received updated labs, message to pt She would like to go ahead with zio patch for now

## 2021-04-07 NOTE — ED Provider Notes (Signed)
[facility]    CSN: [id number] Arrival date & time: 04/07/21  0806      History   Chief Complaint Chief Complaint  Patient presents with   Shoulder Pain    HPI April Graham is a 41 y.o. female.    Shoulder Pain Here for left upper back pain radiating into her anterior chest.  This has been going on for approximately 3 days.  It gets worse at night and she can note tachycardia on her Apple Watch. With that heart rate is usually in the 120s to 130s when this happens.  No associated nausea or vomiting.  No diaphoresis.  May be catches her breath when its at its worst.  Movement does not worsen it.  No recent fever or cough.  She has not had any associated burping either.  She did try Pepcid and Tums, and this did not seem to help much  Past Medical History:  Diagnosis Date   Anxiety    Arthritis    Irritable bowel syndrome (IBS)     There are no problems to display for this patient.   Past Surgical History:  Procedure Laterality Date   ADENOIDECTOMY     KNEE ARTHROSCOPY     KNEE ARTHROSCOPY W/ ACL RECONSTRUCTION     TONSILLECTOMY     WISDOM TOOTH EXTRACTION      OB History   No obstetric history on file.      Home Medications    Prior to Admission medications   Medication Sig Start Date End Date Taking? Authorizing Provider  nitroGLYCERIN (NITROSTAT) 0.4 MG SL tablet Place 1 tablet (0.4 mg total) under the tongue every 5 (five) minutes as needed for chest pain (NTE 3 doses). 04/07/21  Yes Tarrah Furuta, Janace Aris, MD  LORazepam (ATIVAN) 1 MG tablet Take 0.5 tablets (0.5 mg total) by mouth as needed. May take twice daily for occasional anxiety 11/07/20   Copland, Gwenlyn Found, MD  Norgestimate-Ethinyl Estradiol Triphasic (TRI-ESTARYLLA) 0.18/0.215/0.25 MG-35 MCG tablet Take 1 tablet by mouth daily. 09/10/20   Copland, Gwenlyn Found, MD    Family History Family History  Problem Relation Age of Onset   Arthritis Mother    Depression Mother    Diabetes Mother     High Cholesterol Mother    Transient ischemic attack Mother    Arthritis Father    Cancer Father    COPD Father    Diabetes Father    Hypertension Father    Cancer Maternal Grandfather    Cancer Paternal Grandmother     Social History Social History   Tobacco Use   Smoking status: Never   Smokeless tobacco: Never  Substance Use Topics   Alcohol use: Yes    Comment: social   Drug use: Never     Allergies   Patient has no known allergies.   Review of Systems Review of Systems   Physical Exam Triage Vital Signs ED Triage Vitals  Enc Vitals Group     BP 04/07/21 0829 (!) 147/74     Pulse Rate 04/07/21 0829 91     Resp 04/07/21 0829 18     Temp 04/07/21 0829 98.4 F (36.9 C)     Temp Source 04/07/21 0829 Oral     SpO2 04/07/21 0829 99 %     Weight --      Height --      Head Circumference --      Peak Flow --      Pain  Score 04/07/21 0831 0     Pain Loc --      Pain Edu? --      Excl. in GC? --    No data found.  Updated Vital Signs BP (!) 147/74 (BP Location: Left Arm)    Pulse 91    Temp 98.4 F (36.9 C) (Oral)    Resp 18    LMP 04/01/2021    SpO2 99%   Visual Acuity Right Eye Distance:   Left Eye Distance:   Bilateral Distance:    Right Eye Near:   Left Eye Near:    Bilateral Near:     Physical Exam Vitals reviewed.  Constitutional:      General: She is not in acute distress.    Appearance: She is not ill-appearing, toxic-appearing or diaphoretic.  HENT:     Mouth/Throat:     Mouth: Mucous membranes are moist.  Eyes:     Extraocular Movements: Extraocular movements intact.     Pupils: Pupils are equal, round, and reactive to light.  Cardiovascular:     Rate and Rhythm: Normal rate and regular rhythm.     Heart sounds: No murmur heard. Pulmonary:     Effort: Pulmonary effort is normal. No respiratory distress.     Breath sounds: Normal breath sounds. No stridor. No wheezing, rhonchi or rales.  Chest:     Chest wall: No tenderness.   Musculoskeletal:     Cervical back: Neck supple.     Right lower leg: No edema.     Left lower leg: No edema.  Lymphadenopathy:     Cervical: No cervical adenopathy.  Skin:    Capillary Refill: Capillary refill takes less than 2 seconds.     Coloration: Skin is not jaundiced or pale.  Neurological:     General: No focal deficit present.     Mental Status: She is alert and oriented to person, place, and time.  Psychiatric:        Behavior: Behavior normal.     UC Treatments / Results  Labs (all labs ordered are listed, but only abnormal results are displayed) Labs Reviewed - No data to display  EKG   Radiology DG Chest 2 View  Result Date: 04/07/2021 CLINICAL DATA:  Atypical chest pain EXAM: CHEST - 2 VIEW COMPARISON:  None FINDINGS: The heart size and mediastinal contours are within normal limits. Both lungs are clear. The visualized skeletal structures are unremarkable. IMPRESSION: No active cardiopulmonary disease. Electronically Signed   By: Elige Ko M.D.   On: 04/07/2021 09:40    Procedures Procedures (including critical care time)  Medications Ordered in UC Medications - No data to display  Initial Impression / Assessment and Plan / UC Course  I have reviewed the triage vital signs and the nursing notes.  Pertinent labs & imaging results that were available during my care of the patient were reviewed by me and considered in my medical decision making (see chart for details).     EKG was normal with normal sinus rhythm.  Chest x-ray was negative for any problem.  She is going to see her primary doctor tomorrow.  A few nitroglycerin pills are sent in for her to try Final Clinical Impressions(s) / UC Diagnoses   Final diagnoses:  Atypical chest pain     Discharge Instructions      Your EKG was normal Your chest x-ray was read as negative for any problem. You can try taking a nitroglycerin pill under your  tongue to see if it helps any of your symptoms  when your pain is worse.     ED Prescriptions     Medication Sig Dispense Auth. Provider   nitroGLYCERIN (NITROSTAT) 0.4 MG SL tablet Place 1 tablet (0.4 mg total) under the tongue every 5 (five) minutes as needed for chest pain (NTE 3 doses). 10 tablet Marlinda Mike Janace Aris, MD      PDMP not reviewed this encounter.   Zenia Resides, MD 04/07/21 1001

## 2021-04-08 ENCOUNTER — Encounter: Payer: Self-pay | Admitting: Family Medicine

## 2021-04-08 ENCOUNTER — Ambulatory Visit (INDEPENDENT_AMBULATORY_CARE_PROVIDER_SITE_OTHER): Payer: No Typology Code available for payment source | Admitting: Family Medicine

## 2021-04-08 VITALS — BP 130/80 | HR 92 | Temp 98.2°F | Resp 16 | Ht 72.0 in | Wt 277.0 lb

## 2021-04-08 DIAGNOSIS — R0789 Other chest pain: Secondary | ICD-10-CM

## 2021-04-08 DIAGNOSIS — R000 Tachycardia, unspecified: Secondary | ICD-10-CM

## 2021-04-08 LAB — COMPREHENSIVE METABOLIC PANEL
ALT: 12 U/L (ref 0–35)
AST: 13 U/L (ref 0–37)
Albumin: 3.9 g/dL (ref 3.5–5.2)
Alkaline Phosphatase: 45 U/L (ref 39–117)
BUN: 13 mg/dL (ref 6–23)
CO2: 24 mEq/L (ref 19–32)
Calcium: 8.7 mg/dL (ref 8.4–10.5)
Chloride: 108 mEq/L (ref 96–112)
Creatinine, Ser: 0.78 mg/dL (ref 0.40–1.20)
GFR: 93.97 mL/min (ref >60.00)
Glucose, Bld: 92 mg/dL (ref 70–99)
Potassium: 4.3 mEq/L (ref 3.5–5.1)
Sodium: 141 mEq/L (ref 135–145)
Total Bilirubin: 0.4 mg/dL (ref 0.2–1.2)
Total Protein: 6.4 g/dL (ref 6.0–8.3)

## 2021-04-08 LAB — TSH: TSH: 1.1 u[IU]/mL (ref 0.35–5.50)

## 2021-04-08 LAB — CBC
HCT: 40.5 % (ref 36.0–46.0)
Hemoglobin: 13.3 g/dL (ref 12.0–15.0)
MCHC: 32.7 g/dL (ref 30.0–36.0)
MCV: 95.7 fl (ref 78.0–100.0)
Platelets: 261 10*3/uL (ref 150.0–400.0)
RBC: 4.24 Mil/uL (ref 3.87–5.11)
RDW: 12.8 % (ref 11.5–15.5)
WBC: 6.6 10*3/uL (ref 4.0–10.5)

## 2021-04-08 LAB — T3, FREE: T3, Free: 3.8 pg/mL (ref 2.3–4.2)

## 2021-04-08 LAB — D-DIMER, QUANTITATIVE: D-Dimer, Quant: 0.28 mcg/mL FEU (ref <0.50)

## 2021-04-08 LAB — TROPONIN I (HIGH SENSITIVITY): High Sens Troponin I: 4 ng/L (ref 2–17)

## 2021-04-08 LAB — POCT URINE PREGNANCY: Preg Test, Ur: NEGATIVE

## 2021-04-08 MED ORDER — PROPRANOLOL HCL 10 MG PO TABS: 10.0000 mg | ORAL_TABLET | Freq: Three times a day (TID) | ORAL | 1 refills | Status: DC

## 2021-04-08 NOTE — Addendum Note (Signed)
Addended by: Abbe Amsterdam C on: 04/08/2021 05:53 PM   Modules accepted: Orders

## 2021-04-09 ENCOUNTER — Ambulatory Visit (INDEPENDENT_AMBULATORY_CARE_PROVIDER_SITE_OTHER): Payer: No Typology Code available for payment source

## 2021-04-09 DIAGNOSIS — R000 Tachycardia, unspecified: Secondary | ICD-10-CM

## 2021-04-09 NOTE — Progress Notes (Unsigned)
Enrolled for [employer] to mail a ZIO XT long term holter monitor to the patients address on file.  

## 2021-04-11 DIAGNOSIS — R000 Tachycardia, unspecified: Secondary | ICD-10-CM | POA: Diagnosis not present

## 2021-04-24 ENCOUNTER — Other Ambulatory Visit: Payer: Self-pay | Admitting: Family Medicine

## 2021-04-24 DIAGNOSIS — Z3041 Encounter for surveillance of contraceptive pills: Secondary | ICD-10-CM

## 2021-04-24 MED ORDER — NORGESTIM-ETH ESTRAD TRIPHASIC 0.18/0.215/0.25 MG-35 MCG PO TABS: 1.0000 | ORAL_TABLET | Freq: Every day | ORAL | 7 refills | Status: DC

## 2021-04-24 NOTE — Addendum Note (Signed)
Addended by: Thelma Barge D on: 04/24/2021 01:37 PM   Modules accepted: Orders

## 2021-04-27 ENCOUNTER — Encounter: Payer: Self-pay | Admitting: Family Medicine

## 2021-05-27 ENCOUNTER — Encounter: Payer: Self-pay | Admitting: Family Medicine

## 2021-05-27 DIAGNOSIS — E669 Obesity, unspecified: Secondary | ICD-10-CM

## 2021-06-04 MED ORDER — WEGOVY 0.25 MG/0.5ML ~~LOC~~ SOAJ: 0.2500 mg | SUBCUTANEOUS | 0 refills | Status: DC

## 2021-06-04 NOTE — Addendum Note (Signed)
Addended by: Abbe Amsterdam C on: 06/04/2021 08:29 PM ? ? Modules accepted: Orders ? ?

## 2021-06-08 ENCOUNTER — Telehealth: Payer: Self-pay

## 2021-06-08 NOTE — Telephone Encounter (Signed)
PA initiated via Covermymeds; KEY: [id number]. Awaiting determination.  ?

## 2021-06-10 NOTE — Telephone Encounter (Signed)
PA approved.  ? ?The request has been approved. The authorization is effective for a maximum of 5 fills from 06/10/2021 to 11/07/2021, as long as the member is enrolled in their current health plan. The request was approved as submitted. This request has been approved for 90mL per 28 days.The following prior authorizations have also been entered effective 06/10/2021 through 11/07/2021: Wegovy 0.5mg /0.15mL, allowing 39mL per 28 days with 5 fills; please reference authorization number [id number]. Wegovy 1mg /0.80mL, allowing 13mL per 28 days with 5 fills; please reference authorization number [id number]. Wegovy 1.7mg /0.76mL, allowing 55mL per 28 days with 5 fills; please reference authorization number [id number]. Wegovy 2.4mg /0.6mL, allowing 62mL per 28 days with 5 fills; please reference authorization number [id number]. A written notification letter will follow with additional details. ?

## 2021-06-29 ENCOUNTER — Other Ambulatory Visit: Payer: Self-pay

## 2021-06-29 MED ORDER — MELOXICAM 15 MG PO TABS
ORAL_TABLET | ORAL | 0 refills | Status: DC
  Filled 2021-06-29: qty 7, 7d supply, fill #0

## 2021-06-29 MED ORDER — DEXAMETHASONE 4 MG PO TABS
ORAL_TABLET | ORAL | 0 refills | Status: DC
  Filled 2021-06-29: qty 12, 6d supply, fill #0

## 2021-06-30 ENCOUNTER — Other Ambulatory Visit: Payer: Self-pay

## 2021-06-30 ENCOUNTER — Encounter: Payer: Self-pay | Admitting: Pharmacist

## 2021-07-03 ENCOUNTER — Other Ambulatory Visit: Payer: Self-pay | Admitting: Family Medicine

## 2021-07-03 DIAGNOSIS — E669 Obesity, unspecified: Secondary | ICD-10-CM

## 2021-07-06 ENCOUNTER — Other Ambulatory Visit: Payer: Self-pay

## 2021-07-06 ENCOUNTER — Other Ambulatory Visit: Payer: Self-pay | Admitting: *Deleted

## 2021-07-06 MED ORDER — SEMAGLUTIDE-WEIGHT MANAGEMENT 0.5 MG/0.5ML ~~LOC~~ SOAJ: 0.5000 mg | SUBCUTANEOUS | 0 refills | Status: DC

## 2021-07-07 ENCOUNTER — Other Ambulatory Visit: Payer: Self-pay

## 2021-07-14 ENCOUNTER — Other Ambulatory Visit: Payer: Self-pay

## 2021-07-24 ENCOUNTER — Other Ambulatory Visit: Payer: Self-pay | Admitting: Family Medicine

## 2021-07-24 DIAGNOSIS — R000 Tachycardia, unspecified: Secondary | ICD-10-CM

## 2021-08-15 ENCOUNTER — Other Ambulatory Visit: Payer: Self-pay | Admitting: Family Medicine

## 2021-08-15 DIAGNOSIS — E669 Obesity, unspecified: Secondary | ICD-10-CM

## 2021-08-31 ENCOUNTER — Ambulatory Visit: Payer: PRIVATE HEALTH INSURANCE | Attending: Family Medicine | Admitting: Physical Therapy

## 2021-08-31 ENCOUNTER — Ambulatory Visit: Payer: No Typology Code available for payment source | Admitting: Physical Therapy

## 2021-08-31 DIAGNOSIS — M25512 Pain in left shoulder: Secondary | ICD-10-CM | POA: Insufficient documentation

## 2021-08-31 DIAGNOSIS — M25612 Stiffness of left shoulder, not elsewhere classified: Secondary | ICD-10-CM | POA: Insufficient documentation

## 2021-08-31 NOTE — Therapy (Signed)
OUTPATIENT PHYSICAL THERAPY SHOULDER EVALUATION   Patient Name: April Graham MRN: [id number] DOB:1981-01-20, 41 y.o., female Today's Date: 08/31/2021   PT End of Session - 08/31/21 1504     Visit Number 1    Number of Visits 16    Date for PT Re-Evaluation 10/26/21    PT Start Time 1305    PT Stop Time 1345    PT Time Calculation (min) 40 min    Activity Tolerance Patient tolerated treatment well             Past Medical History:  Diagnosis Date   Anxiety    Arthritis    Irritable bowel syndrome (IBS)    Past Surgical History:  Procedure Laterality Date   ADENOIDECTOMY     KNEE ARTHROSCOPY     KNEE ARTHROSCOPY W/ ACL RECONSTRUCTION     TONSILLECTOMY     WISDOM TOOTH EXTRACTION     There are no problems to display for this patient.   PCP: Copland, Gwenlyn Found, MD  REFERRING PROVIDER: Fredia Sorrow, PA   REFERRING DIAG: M25.519 (ICD-10-CM) - AC joint pain   THERAPY DIAG:  Acute pain of left shoulder - Plan: PT plan of care cert/re-cert  Stiffness of left shoulder, not elsewhere classified - Plan: PT plan of care cert/re-cert  Rationale for Evaluation and Treatment Rehabilitation  ONSET DATE: 06/23/21 ( DOI)  SUBJECTIVE:                                                                                                                                                                                      SUBJECTIVE STATEMENT: Pt reports Dr. Ave Filter reports she had swelling in her left AC joint. 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 Pt is currently on restrictions on pushing, pulling and is restricted to moving less than 10 lb.Pt had injection in area of AC joint on 08/03/21. Pt is not currently taking any medications for her left shoulder but she had taken a steroid pack and mobic but did not have good results. Pt has had no sharp pains in the joint area since.  Pt states she also may have exacerbated the pain when she was playing catch with her nephew but reports most pain as just soreness in area of upper trap region. 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 She needs to eventually get back to high functional use of the shoulder for readjusting patients, hanging IV lines, ambulation assists, pushing wheelchairs, pushing beds.   PERTINENT  HISTORY: Pt reports Dr. Ave Filter reports she had swelling in her left [facility] joint. 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 Pt is currently on restrictions on pushing, pulling and is restricted to moving less than 10 lb.Pt had injection in area of AC joint on 08/03/21. Pt is not currently taking any medications for her left shoulder but she had taken a steroid pack and mobic but did not have good results. Pt has had no sharp pains in the joint area since. Pt states she also may have exacerbated the pain when she was playing catch with her nephew but reports most pain as just soreness in area of upper trap region. 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 She needs to eventually get back to high functional use of the shoulder for readjusting patients, hanging IV lines, ambulation assists, pushing wheelchairs, pushing beds  PAIN:  Are you having pain? Yes: NPRS scale: 2/10 Pain location: Upper trap region  Pain description: sore Aggravating factors: unsure, can happen at rest or during activity, may be related to working for acouple of days  Relieving factors: unsure   PRECAUTIONS: Shoulder no lifting > 10 lb, no pushing or pulling   WEIGHT BEARING RESTRICTIONS No  FALLS:  Has patient fallen in last 6 months? 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 She needs to eventually get back to high functional use of the shoulder for readjusting patients, hanging IV lines, ambulation assists, pushing wheelchairs, pushing beds.   PLOF: Independent with basic ADLs  PATIENT GOALS Pt would like to get back to full work duty with no restrictions.   OBJECTIVE: (objective measures completed at initial evaluation unless otherwise dated)   DIAGNOSTIC FINDINGS:  X ray- no significant findings  Ultrasound guided injection with some AC joint inflammation noted.   PATIENT SURVEYS:  FOTO 63  COGNITION:  Overall cognitive status: Within functional limits for tasks assessed     SENSATION: WFL  POSTURE: Rounded shoulder   UPPER EXTREMITY ROM: upper trap pain/ discomfort noted at end range flexion and abduction but full range available. Pain 2/10.   Active ROM Right eval Left eval  Shoulder flexion WNL WNL  Shoulder extension WNL WNL  Shoulder abduction WNL WNL  Shoulder adduction WNL WNL  Shoulder internal rotation WNL WNL  Shoulder external rotation WNL WNL  Elbow flexion WNL WNL  Elbow extension WNL WNL  Wrist flexion    Wrist extension    Wrist ulnar deviation    Wrist radial deviation    Wrist pronation    Wrist supination    (Blank rows = not tested)  UPPER EXTREMITY MMT:  MMT Right eval Left eval  Shoulder flexion 5 4+  Shoulder extension 4+ 4  Shoulder abduction 5 4+  Shoulder adduction    Shoulder internal rotation 5 5  Shoulder external rotation 5 4  Middle trapezius 4+ 4  Lower trapezius 4+ 4  Elbow flexion 5 5  Elbow extension 5 5  Wrist flexion    Wrist extension    Wrist ulnar deviation    Wrist radial deviation  Wrist pronation    Wrist supination    Grip strength (lbs)    (Blank rows = not tested)  SHOULDER SPECIAL TESTS:  Impingement tests: Hawkins/Kennedy impingement test: negative   Rotator cuff assessment: Empty  can test: negative and Full can test: negative  Biceps assessment: Speed's test: positive , pain felt when test ended ( when muscle still active but no resistance applied)     PALPATION:  TTP and TrPs noted in upper traps    TODAY'S TREATMENT:  Eval only    PATIENT EDUCATION: Education details: Physical Therapy plan of care   Person educated: Patient Education method: Explanation Education comprehension: verbalized understanding   HOME EXERCISE PROGRAM: TO be established second session.   ASSESSMENT:  CLINICAL IMPRESSION: Patient is a 41  y.o. female who was seen today for physical therapy evaluation and treatment for complaints of shoulder pain on her left side.    OBJECTIVE IMPAIRMENTS decreased ROM, decreased strength, and impaired UE functional use.   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   REHAB POTENTIAL: Good  CLINICAL DECISION MAKING: Stable/uncomplicated  EVALUATION COMPLEXITY: Low   GOALS: Goals reviewed with patient? Yes  SHORT TERM GOALS: Target date: 09/28/2021  (Remove Blue Hyperlink)  Patient will be independent in home exercise program to improve strength/mobility for better functional independence with ADLs. Baseline: Goal status: INITIAL  LONG TERM GOALS: Target date: 10/26/2021  (Remove Blue Hyperlink)   Patient will increase FOTO score to equal to or greater than  68   to demonstrate statistically significant improvement in mobility and quality of life.  Baseline: 63 Goal status: INITIAL  2.  Patient will be able to perform household work/ chores without increase in symptoms. Baseline: unable to perform without discomfort and due to MD restrictions Goal status: INITIAL  3.   Patient will report a worst pain of 3/10 on VAS  to improve tolerance with ADLs and reduced symptoms with activities.  Baseline:  Goal status: INITIAL  4.  Patient will improve shoulder AROM to > 140 degrees of flexion, scaption, and abduction without complaints of pain or discomfort  for improved ability to perform overhead activities. Baseline: upper trap region discomfort with end range flexion and abduction  Goal status: INITIAL 5.  Patient will improve shoulder strength to 5/5 without complaints of pain or discomfort   in ER, abduction, and flexion and 4+/5 for mid and lower traps for improved ability to perform overhead activities. Baseline: see objective  Goal status: INITIAL   PLAN: PT FREQUENCY: 1-2x/week  PT DURATION: 8 weeks  PLANNED INTERVENTIONS: Therapeutic exercises, Therapeutic activity, Neuromuscular re-education, Balance training, Gait training, Patient/Family education, Joint mobilization, Dry Needling, Moist heat, Ultrasound, and Manual therapy  PLAN FOR NEXT SESSION: Establish HEP, begin therapeutic interventions    Particia Lather, PT 08/31/2021, 3:06 PM

## 2021-08-31 NOTE — Therapy (Signed)
OUTPATIENT PHYSICAL THERAPY SHOULDER TREATMENT   Patient Name: April Graham MRN: [id number] DOB:02-21-1981, 41 y.o., female Today's Date: 09/02/2021   PT End of Session - 09/02/21 1118     Visit Number 2    Number of Visits 16    Date for PT Re-Evaluation 10/26/21    PT Start Time 1116    PT Stop Time 1201    PT Time Calculation (min) 45 min    Activity Tolerance Patient tolerated treatment well              Past Medical History:  Diagnosis Date   Anxiety    Arthritis    Irritable bowel syndrome (IBS)    Past Surgical History:  Procedure Laterality Date   ADENOIDECTOMY     KNEE ARTHROSCOPY     KNEE ARTHROSCOPY W/ ACL RECONSTRUCTION     TONSILLECTOMY     WISDOM TOOTH EXTRACTION     There are no problems to display for this patient.   PCP: Copland, Gwenlyn Found, MD  REFERRING PROVIDER: Fredia Sorrow, PA   REFERRING DIAG: M25.519 (ICD-10-CM) - AC joint pain   THERAPY DIAG:  Acute pain of left shoulder  Stiffness of left shoulder, not elsewhere classified  Rationale for Evaluation and Treatment Rehabilitation  ONSET DATE: 06/23/21 ( DOI)  SUBJECTIVE:                                                                                                                                                                                      SUBJECTIVE STATEMENT: Patient reports bringing her arm overhead continues to bother her. Just moved and is limited in her ability to unpack boxes   PERTINENT HISTORY: Pt reports Dr. Ave Filter reports she had swelling in her left [facility] joint. 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 Pt is currently on restrictions on pushing, pulling and is restricted to moving less than 10 lb.Pt had injection in area of AC joint on 08/03/21. Pt is not currently taking any medications for her left shoulder but she had taken a steroid pack and mobic but did  not have good results. Pt has had no sharp pains in the joint area since. Pt states she also may have exacerbated the pain when she was playing catch with her nephew but reports most pain as just soreness in area of upper trap region. 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 She needs to eventually get back to high functional use of the shoulder for readjusting patients,  hanging IV lines, ambulation assists, pushing wheelchairs, pushing beds  PAIN:  Are you having pain? Yes: NPRS scale: 2/10 Pain location: Upper trap region  Pain description: sore Aggravating factors: unsure, can happen at rest or during activity, may be related to working for acouple of days  Relieving factors: unsure   PRECAUTIONS: Shoulder no lifting > 10 lb, no pushing or pulling   WEIGHT BEARING RESTRICTIONS No  FALLS:  Has patient fallen in last 6 months? 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 She needs to eventually get back to high functional use of the shoulder for readjusting patients, hanging IV lines, ambulation assists, pushing wheelchairs, pushing beds.   PLOF: Independent with basic ADLs  PATIENT GOALS Pt would like to get back to full work duty with no restrictions.   OBJECTIVE: (objective measures completed at initial evaluation unless otherwise dated)     UPPER EXTREMITY MMT:  MMT Right eval Left eval  Shoulder flexion 5 4+  Shoulder extension 4+ 4  Shoulder abduction 5 4+  Shoulder adduction    Shoulder internal rotation 5 5  Shoulder external rotation 5 4  Middle trapezius 4+ 4  Lower trapezius 4+ 4  Elbow flexion 5 5  Elbow extension 5 5  (Blank rows = not tested)   TODAY'S TREATMENT:  Manual: Grade II-III mobilizations thoracic spine x 3  minutes GH inferior, AP, PA glides 10x 3 seconds each direction  L shoulder distraction inferior glide 3x30 seconds L shoulder cross body adduction with distraction 10x  Bicep trigger point reduction with movement x 3 minutes  TherEx: Isometrics against wall: (see HEP); cueing for 30-45% muscle activation to remain in pain free range. Scapular retraction 10x L stretch 30 seconds x 2 trials  Upper trap stretch 30 seconds x2 trials   Trigger Point Dry Needling (TDN), unbilled Education performed with patient regarding potential benefit of TDN. Reviewed precautions and risks with patient. Reviewed special precautions/risks over lung fields which include pneumothorax. Reviewed signs and symptoms of pneumothorax and advised pt to go to ER immediately if these symptoms develop advise them of dry needling treatment. Extensive time spent with pt to ensure full understanding of TDN risks. Pt provided verbal consent to treatment. TDN performed to  with 0.25 x 40 single needle placements with local twitch response (LTR). Pistoning technique utilized. Improved pain-free motion following intervention. Upper trap x3 minutes    PATIENT EDUCATION: Education details: Physical Therapy plan of care   Person educated: Patient Education method: Explanation Education comprehension: verbalized understanding   HOME EXERCISE PROGRAM: Access Code: [email or web address] URL: [email or web address] Date: 09/02/2021 Prepared by: Precious Bard  Exercises - Seated Scapular Retraction  - 1 x daily - 7 x weekly - 2 sets - 10 reps - 5 hold - Seated Upper Trapezius Stretch  - 1 x daily - 7 x weekly - 2 sets - 2 reps - 30 hold - Isometric Shoulder Flexion at Wall  - 1 x daily - 7 x weekly - 2 sets - 10 reps - 5 hold - Isometric Shoulder External Rotation at Wall  - 1 x daily - 7 x weekly - 2 sets - 10 reps - 5 hold - Isometric Shoulder Abduction with Ball at Wall  - 1  x daily - 7 x weekly - 2 sets - 10 reps - 5  hold - Standing Isometric Shoulder Internal Rotation at Wall with Ball  - 1 x daily - 7 x weekly - 2 sets - 10 reps - 5 hold - Standing 'L' Stretch at Counter  - 1 x daily - 7 x weekly - 2 sets - 10 reps - 5 hold   ASSESSMENT:  CLINICAL IMPRESSION: Patient presents with decreased pain. Is introduced to TDN to L upper trap without pain. Was educated on HEP and demonstrated understanding. Postural education with use of L stretch and upper trap stretch during work time tolerated well. Future session should focus on posture and body mechanics for pain reduction technique. Patient will continue to benefit from skilled physical therapy to reduce pain, improve function, and return patient to PLOF.   OBJECTIVE IMPAIRMENTS decreased ROM, decreased strength, and impaired UE functional use.   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   REHAB POTENTIAL: Good  CLINICAL DECISION MAKING: Stable/uncomplicated  EVALUATION COMPLEXITY: Low   GOALS: Goals reviewed with patient? Yes  SHORT TERM GOALS: Target date: 09/28/2021  (Remove Blue Hyperlink)  Patient will be independent in home exercise program to improve strength/mobility for better functional independence with ADLs. Baseline: Goal status: INITIAL  LONG TERM GOALS: Target date: 10/26/2021  (Remove Blue Hyperlink)   Patient will increase FOTO score to equal to or greater than  68   to demonstrate statistically significant improvement in mobility and quality of life.  Baseline: 63 Goal status: INITIAL  2.  Patient will be able to perform household work/ chores without increase in symptoms. Baseline: unable to perform without discomfort and due to MD restrictions Goal status:  INITIAL  3.  Patient will report a worst pain of 3/10 on VAS  to improve tolerance with ADLs and reduced symptoms with activities.  Baseline:  Goal status: INITIAL  4.  Patient will improve shoulder AROM to > 140 degrees of flexion, scaption, and abduction without complaints of pain or discomfort  for improved ability to perform overhead activities. Baseline: upper trap region discomfort with end range flexion and abduction  Goal status: INITIAL 5.  Patient will improve shoulder strength to 5/5 without complaints of pain or discomfort   in ER, abduction, and flexion and 4+/5 for mid and lower traps for improved ability to perform overhead activities. Baseline: see objective  Goal status: INITIAL   PLAN: PT FREQUENCY: 1-2x/week  PT DURATION: 8 weeks  PLANNED INTERVENTIONS: Therapeutic exercises, Therapeutic activity, Neuromuscular re-education, Balance training, Gait training, Patient/Family education, Joint mobilization, Dry Needling, Moist heat, Ultrasound, and Manual therapy  PLAN FOR NEXT SESSION: Establish HEP, begin therapeutic interventions    Precious Bard, PT 09/02/2021, 12:27 PM

## 2021-09-02 ENCOUNTER — Ambulatory Visit: Payer: PRIVATE HEALTH INSURANCE | Attending: Family Medicine

## 2021-09-02 DIAGNOSIS — M25612 Stiffness of left shoulder, not elsewhere classified: Secondary | ICD-10-CM | POA: Insufficient documentation

## 2021-09-02 DIAGNOSIS — M25512 Pain in left shoulder: Secondary | ICD-10-CM | POA: Diagnosis present

## 2021-09-03 ENCOUNTER — Encounter: Payer: No Typology Code available for payment source | Admitting: Physical Therapy
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
## 2021-09-07 ENCOUNTER — Encounter: Payer: No Typology Code available for payment source | Admitting: Physical Therapy

## 2021-09-09 ENCOUNTER — Encounter: Payer: No Typology Code available for payment source | Admitting: Physical Therapy

## 2021-09-10 ENCOUNTER — Ambulatory Visit: Payer: PRIVATE HEALTH INSURANCE | Admitting: Physical Therapy

## 2021-09-10 DIAGNOSIS — M25512 Pain in left shoulder: Secondary | ICD-10-CM | POA: Diagnosis not present

## 2021-09-10 DIAGNOSIS — M25612 Stiffness of left shoulder, not elsewhere classified: Secondary | ICD-10-CM

## 2021-09-10 NOTE — Therapy (Signed)
OUTPATIENT PHYSICAL THERAPY SHOULDER TREATMENT   Patient Name: April Graham MRN: [id number] DOB:11/27/1980, 41 y.o., female Today's Date: 09/10/2021   PT End of Session - 09/10/21 1319     Visit Number 3    Number of Visits 16    Date for PT Re-Evaluation 10/26/21    PT Start Time 1104    PT Stop Time 1145    PT Time Calculation (min) 41 min    Activity Tolerance Patient tolerated treatment well               Past Medical History:  Diagnosis Date   Anxiety    Arthritis    Irritable bowel syndrome (IBS)    Past Surgical History:  Procedure Laterality Date   ADENOIDECTOMY     KNEE ARTHROSCOPY     KNEE ARTHROSCOPY W/ ACL RECONSTRUCTION     TONSILLECTOMY     WISDOM TOOTH EXTRACTION     There are no problems to display for this patient.   PCP: Copland, Gwenlyn Found, MD  REFERRING PROVIDER: Fredia Sorrow, PA   REFERRING DIAG: M25.519 (ICD-10-CM) - AC joint pain   THERAPY DIAG:  Acute pain of left shoulder  Stiffness of left shoulder, not elsewhere classified  Rationale for Evaluation and Treatment Rehabilitation  ONSET DATE: 06/23/21 ( DOI)  SUBJECTIVE:                                                                                                                                                                                      SUBJECTIVE STATEMENT: Pt reports she was out on a trip with girls last week where she went rafting and although she babied it she did not have any soreness. She also reports getting a massage and it felt good but had some soreness from it.   PERTINENT HISTORY: Pt reports Dr. Ave Filter reports she had swelling in her left [facility] joint. 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 Pt is currently on restrictions on pushing, pulling and is restricted to moving less than 10 lb.Pt had injection in area of AC joint on 08/03/21. Pt is not  currently taking any medications for her left shoulder but she had taken a steroid pack and mobic but did not have good results. Pt has had no sharp pains in the joint area since. Pt states she also may have exacerbated the pain when she was playing catch with her nephew but reports most pain as just soreness in area of upper trap region. 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 She needs to eventually get back to high functional use of the shoulder for readjusting patients, hanging IV lines, ambulation assists, pushing wheelchairs, pushing beds  PAIN:  Are you having pain? Yes: NPRS scale: 2/10 Pain location: Upper trap region  Pain description: sore Aggravating factors: unsure, can happen at rest or during activity, may be related to working for acouple of days  Relieving factors: unsure   PRECAUTIONS: Shoulder no lifting > 10 lb, no pushing or pulling   WEIGHT BEARING RESTRICTIONS No  FALLS:  Has patient fallen in last 6 months? 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 She needs to eventually get back to high functional use of the shoulder for readjusting patients, hanging IV lines, ambulation assists, pushing wheelchairs, pushing beds.   PLOF: Independent with basic ADLs  PATIENT GOALS Pt would like to get back to full work duty with no restrictions.   OBJECTIVE: (objective measures completed at initial evaluation unless otherwise dated)     UPPER EXTREMITY MMT:  MMT Right eval Left eval  Shoulder flexion 5 4+  Shoulder extension 4+ 4  Shoulder abduction 5 4+  Shoulder adduction    Shoulder internal rotation 5 5  Shoulder external rotation 5 4  Middle trapezius 4+ 4  Lower trapezius 4+ 4  Elbow flexion 5 5  Elbow extension 5 5   (Blank rows = not tested)   TODAY'S TREATMENT:  Manual: Grade II-III mobilizations thoracic spine x 3 minutes GH inferior, AP, PA glides 10x 3 seconds each direction  L shoulder distraction inferior glide 3x30 seconds Bicep trigger point reduction with movement x 3 minutes L upper trap manual stretch 2 x 35 seconds  L UT trigger point mobilization with movement x 5 reps with 5 second hold in stretch  L levator trigger point mobilization with movement x 5 reps.   TherEx: Prone  left shoulder extension x 10 reps no weight  -2 x 10 1 # weight, no pain noted, but some weakness felt  Prone  horizontal abduction L shoulder x 10 reps no weight  -2 x 10 1 # weight, no pain noted, but some weakness felt  Prone row with some abduction with 2# weight 2 x 10 reps      PATIENT EDUCATION: Education details: Physical Therapy plan of care   Person educated: Patient Education method: Explanation Education comprehension: verbalized understanding   HOME EXERCISE PROGRAM: Access Code: [email or web address] URL: [email or web address] Date: 09/02/2021 Prepared by: Janna Arch  Exercises - Seated Scapular Retraction  - 1 x daily - 7 x weekly - 2 sets - 10 reps - 5 hold - Seated Upper Trapezius Stretch  - 1 x daily - 7 x weekly - 2 sets - 2 reps - 30 hold - Isometric Shoulder Flexion at Wall  - 1 x daily - 7 x weekly - 2 sets - 10 reps - 5 hold - Isometric Shoulder External Rotation at Wall  - 1 x daily - 7 x weekly - 2 sets - 10 reps - 5 hold - Isometric Shoulder Abduction with Ball at [place] 1 x daily - 7 x weekly - 2 sets - 10 reps - 5 hold - Standing Isometric Shoulder  Internal Rotation at [employer] with [employer]  - 1 x daily - 7 x weekly - 2 sets - 10 reps - 5 hold - Standing 'L' Stretch at Counter  - 1 x daily - 7 x weekly - 2 sets - 10 reps - 5 hold   ASSESSMENT:  CLINICAL IMPRESSION: Patient continues to present with decreased pain in her left shoulder region.  Patient does still have  some minimal discomfort but continues to note improvement.  Patient is continue with home exercise program as well as been able to participate in more recreational activities although still limiting her left shoulder use.  Patient progressed with left shoulder strengthening with good response this session with no increase in pain noted.  Patient will continue to benefit with skilled physical therapy to improve her left shoulder range of motion, quality of life, allow return to her normal work and community activities without restriction.   OBJECTIVE IMPAIRMENTS decreased ROM, decreased strength, and impaired UE functional use.   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   REHAB POTENTIAL: Good  CLINICAL DECISION MAKING: Stable/uncomplicated  EVALUATION COMPLEXITY: Low   GOALS: Goals reviewed with patient? Yes  SHORT TERM GOALS: Target date: 09/28/2021  (Remove Blue Hyperlink)  Patient will be independent in home exercise program to improve strength/mobility for better functional independence with ADLs. Baseline: Goal status: INITIAL  LONG TERM GOALS: Target date: 10/26/2021  (Remove Blue Hyperlink)   Patient will increase FOTO score to equal to or greater than  68   to demonstrate statistically significant improvement in mobility and quality of life.  Baseline: 63 Goal status: INITIAL  2.  Patient will be able to perform household work/ chores without increase in symptoms. Baseline: unable to perform without discomfort and due to MD restrictions Goal status: INITIAL  3.  Patient will report a worst pain of 3/10 on VAS  to improve tolerance with ADLs and reduced symptoms with activities.  Baseline:  Goal status: INITIAL  4.   Patient will improve shoulder AROM to > 140 degrees of flexion, scaption, and abduction without complaints of pain or discomfort  for improved ability to perform overhead activities. Baseline: upper trap region discomfort with end range flexion and abduction  Goal status: INITIAL 5.  Patient will improve shoulder strength to 5/5 without complaints of pain or discomfort   in ER, abduction, and flexion and 4+/5 for mid and lower traps for improved ability to perform overhead activities. Baseline: see objective  Goal status: INITIAL   PLAN: PT FREQUENCY: 1-2x/week  PT DURATION: 8 weeks  PLANNED INTERVENTIONS: Therapeutic exercises, Therapeutic activity, Neuromuscular re-education, Balance training, Gait training, Patient/Family education, Joint mobilization, Dry Needling, Moist heat, Ultrasound, and Manual therapy  PLAN FOR NEXT SESSION: Continue POC, postural retraining when indicated    Norman Herrlich, PT 09/10/2021, 1:24 PM

## 2021-09-11 ENCOUNTER — Ambulatory Visit: Payer: PRIVATE HEALTH INSURANCE | Admitting: Physical Therapy

## 2021-09-11 DIAGNOSIS — M25512 Pain in left shoulder: Secondary | ICD-10-CM | POA: Diagnosis not present

## 2021-09-11 DIAGNOSIS — M25612 Stiffness of left shoulder, not elsewhere classified: Secondary | ICD-10-CM

## 2021-09-11 NOTE — Therapy (Signed)
OUTPATIENT PHYSICAL THERAPY SHOULDER TREATMENT   Patient Name: April Graham MRN: [id number] DOB:1980-07-16, 41 y.o., female Today's Date: 09/11/2021   PT End of Session - 09/11/21 0804     Visit Number 4    Number of Visits 16    Date for PT Re-Evaluation 10/26/21    PT Start Time 0803    PT Stop Time 0844    PT Time Calculation (min) 41 min    Activity Tolerance Patient tolerated treatment well    Behavior During Therapy [facility] for tasks assessed/performed               Past Medical History:  Diagnosis Date   Anxiety    Arthritis    Irritable bowel syndrome (IBS)    Past Surgical History:  Procedure Laterality Date   ADENOIDECTOMY     KNEE ARTHROSCOPY     KNEE ARTHROSCOPY W/ ACL RECONSTRUCTION     TONSILLECTOMY     WISDOM TOOTH EXTRACTION     There are no problems to display for this patient.   PCP: Copland, Gwenlyn Found, MD  REFERRING PROVIDER: Fredia Sorrow, PA   REFERRING DIAG: M25.519 (ICD-10-CM) - AC joint pain   THERAPY DIAG:  Acute pain of left shoulder  Stiffness of left shoulder, not elsewhere classified  Rationale for Evaluation and Treatment Rehabilitation  ONSET DATE: 06/23/21 ( DOI)  SUBJECTIVE:                                                                                                                                                                                      SUBJECTIVE STATEMENT: Pt reports she has some soreness following yesterday's exercise routine in PT but is only in post shoulder musculature which was targeted in therapy. Pt reports no pain in previously painful area.   PERTINENT HISTORY: Pt reports Dr. Ave Filter reports she had swelling in her left [facility] joint. 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 Pt is currently on restrictions on pushing, pulling and is restricted to moving less than 10 lb.Pt had injection in area  of AC joint on 08/03/21. Pt is not currently taking any medications for her left shoulder but she had taken a steroid pack and mobic but did not have good results. Pt has had no sharp pains in the joint area since. Pt states she also may have exacerbated the pain when she was playing catch with her nephew but reports most pain as just soreness in area of upper trap region. 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 She needs to eventually get back to high functional use of the shoulder for readjusting patients, hanging IV lines, ambulation assists, pushing wheelchairs, pushing beds  PAIN:  Are you having pain? Yes: NPRS scale: 4/10 Pain location: posterior shoulder (soreness)   Pain description: sore Aggravating factors: targeted exercise  Relieving factors: ice, ibuprofin   PRECAUTIONS: Shoulder no lifting > 10 lb, no pushing or pulling   WEIGHT BEARING RESTRICTIONS No  FALLS:  Has patient fallen in last 6 months? 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 She needs to eventually get back to high functional use of the shoulder for readjusting patients, hanging IV lines, ambulation assists, pushing wheelchairs, pushing beds.   PLOF: Independent with basic ADLs  PATIENT GOALS Pt would like to get back to full work duty with no restrictions.   OBJECTIVE: (objective measures completed at initial evaluation unless otherwise dated)     UPPER EXTREMITY MMT:  MMT Right eval Left eval  Shoulder flexion 5 4+  Shoulder extension 4+ 4  Shoulder abduction 5 4+  Shoulder adduction    Shoulder internal rotation 5 5  Shoulder external rotation 5 4  Middle trapezius 4+ 4  Lower trapezius 4+ 4  Elbow flexion 5 5  Elbow extension 5 5  (Blank rows = not  tested)   TODAY'S TREATMENT:  Manual: Grade II-III mobilizations thoracic spine x 3 minutes GH inferior, AP, PA glides 10x 3 seconds each direction  L shoulder distraction inferior glide 3x30 seconds Bicep trigger point reduction with movement x 3 minutes L upper trap manual stretch 2 x 35 seconds  STM, ischemic TP release and cross friction massage to rhomboids and middle traps and levator scapulae ( L side)   Therex:   L UE Serratus punches x 5 x 3 sec no weight  - 2 x 10 x 5 second holds   L UE shoulder IR with band ( YTB)  -2 x 10 with towel roll in axilla         PATIENT EDUCATION: Education details: Pt educated throughout session about proper posture and technique with exercises. Improved exercise technique, movement at target joints, use of target muscles after min to mod verbal, visual, tactile cues.    Person educated: Patient Education method: Explanation Education comprehension: verbalized understanding   HOME EXERCISE PROGRAM: Access Code: [email or web address] URL: [email or web address] Date: 09/02/2021 Prepared by: Precious Bard  Exercises - Seated Scapular Retraction  - 1 x daily - 7 x weekly - 2 sets - 10 reps - 5 hold - Seated Upper Trapezius Stretch  - 1 x daily - 7 x weekly - 2 sets - 2 reps - 30 hold - Isometric Shoulder Flexion at Wall  - 1 x daily - 7 x weekly - 2 sets - 10 reps - 5 hold - Isometric Shoulder External Rotation at Wall  - 1 x daily - 7 x weekly - 2 sets - 10 reps - 5 hold - Isometric Shoulder Abduction with Ball at Wall  - 1 x daily - 7 x weekly - 2 sets - 10 reps - 5 hold - Standing Isometric Shoulder Internal Rotation at Wall with Ball  - 1 x daily - 7 x weekly -  2 sets - 10 reps - 5 hold - Standing 'L' Stretch at Counter  - 1 x daily - 7 x weekly - 2 sets - 10 reps - 5 hold   ASSESSMENT:  CLINICAL IMPRESSION: Patient continues to present with decreased pain in her left shoulder region, does have some soreness in her UE from  yesterday's PT but soreness in target musculature.   Patient progressed with left shoulder strengthening with good response this session with no increase in pain noted.  Patient will continue to benefit with skilled physical therapy to improve her left shoulder range of motion, quality of life, allow return to her normal work and community activities without restriction.   OBJECTIVE IMPAIRMENTS decreased ROM, decreased strength, and impaired UE functional use.   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   REHAB POTENTIAL: Good  CLINICAL DECISION MAKING: Stable/uncomplicated  EVALUATION COMPLEXITY: Low   GOALS: Goals reviewed with patient? Yes  SHORT TERM GOALS: Target date: 09/28/2021  (Remove Blue Hyperlink)  Patient will be independent in home exercise program to improve strength/mobility for better functional independence with ADLs. Baseline: Goal status: INITIAL  LONG TERM GOALS: Target date: 10/26/2021  (Remove Blue Hyperlink)   Patient will increase FOTO score to equal to or greater than  68   to demonstrate statistically significant improvement in mobility and quality of life.  Baseline: 63 Goal status: INITIAL  2.  Patient will be able to perform household work/ chores without increase in symptoms. Baseline: unable to perform without discomfort and due to MD restrictions Goal status: INITIAL  3.  Patient will report a worst pain of 3/10 on VAS  to improve tolerance with ADLs and reduced symptoms with activities.  Baseline:  Goal status: INITIAL  4.  Patient will improve shoulder AROM to > 140 degrees of flexion, scaption, and abduction without complaints of pain or discomfort  for improved ability to perform overhead  activities. Baseline: upper trap region discomfort with end range flexion and abduction  Goal status: INITIAL 5.  Patient will improve shoulder strength to 5/5 without complaints of pain or discomfort   in ER, abduction, and flexion and 4+/5 for mid and lower traps for improved ability to perform overhead activities. Baseline: see objective  Goal status: INITIAL   PLAN: PT FREQUENCY: 1-2x/week  PT DURATION: 8 weeks  PLANNED INTERVENTIONS: Therapeutic exercises, Therapeutic activity, Neuromuscular re-education, Balance training, Gait training, Patient/Family education, Joint mobilization, Dry Needling, Moist heat, Ultrasound, and Manual therapy  PLAN FOR NEXT SESSION: Continue POC, postural retraining when indicated    Norman Herrlich, PT 09/11/2021, 10:00 AM

## 2021-09-14 ENCOUNTER — Encounter (HOSPITAL_BASED_OUTPATIENT_CLINIC_OR_DEPARTMENT_OTHER): Payer: Self-pay

## 2021-09-14 ENCOUNTER — Other Ambulatory Visit: Payer: Self-pay | Admitting: Family Medicine

## 2021-09-14 ENCOUNTER — Encounter: Payer: No Typology Code available for payment source | Admitting: Physical Therapy

## 2021-09-14 ENCOUNTER — Ambulatory Visit (INDEPENDENT_AMBULATORY_CARE_PROVIDER_SITE_OTHER): Payer: No Typology Code available for payment source | Admitting: Family Medicine

## 2021-09-14 ENCOUNTER — Ambulatory Visit (HOSPITAL_BASED_OUTPATIENT_CLINIC_OR_DEPARTMENT_OTHER)
Admission: RE | Admit: 2021-09-14 | Discharge: 2021-09-14 | Disposition: A | Discharge status: Home / Self Care | Payer: No Typology Code available for payment source | Source: Ambulatory Visit | Attending: Family Medicine | Admitting: Family Medicine

## 2021-09-14 ENCOUNTER — Encounter: Payer: Self-pay | Admitting: Family Medicine

## 2021-09-14 VITALS — BP 110/64 | HR 71 | Temp 97.8°F | Resp 18 | Ht 72.0 in | Wt 275.4 lb

## 2021-09-14 DIAGNOSIS — Z1231 Encounter for screening mammogram for malignant neoplasm of breast: Secondary | ICD-10-CM

## 2021-09-14 DIAGNOSIS — Z0000 Encounter for general adult medical examination without abnormal findings: Secondary | ICD-10-CM | POA: Diagnosis not present

## 2021-09-14 DIAGNOSIS — R000 Tachycardia, unspecified: Secondary | ICD-10-CM

## 2021-09-14 DIAGNOSIS — Z13220 Encounter for screening for lipoid disorders: Secondary | ICD-10-CM

## 2021-09-14 DIAGNOSIS — E669 Obesity, unspecified: Secondary | ICD-10-CM

## 2021-09-14 DIAGNOSIS — Z131 Encounter for screening for diabetes mellitus: Secondary | ICD-10-CM | POA: Diagnosis not present

## 2021-09-14 LAB — BASIC METABOLIC PANEL
BUN: 19 mg/dL (ref 6–23)
CO2: 26 mEq/L (ref 19–32)
Calcium: 8.7 mg/dL (ref 8.4–10.5)
Chloride: 107 mEq/L (ref 96–112)
Creatinine, Ser: 0.66 mg/dL (ref 0.40–1.20)
GFR: 108.19 mL/min (ref >60.00)
Glucose, Bld: 88 mg/dL (ref 70–99)
Potassium: 4.7 mEq/L (ref 3.5–5.1)
Sodium: 140 mEq/L (ref 135–145)

## 2021-09-14 LAB — LIPID PANEL
Cholesterol: 161 mg/dL (ref 0–200)
HDL: 62.1 mg/dL (ref >39.00)
LDL Cholesterol: 80 mg/dL (ref 0–99)
NonHDL: 98.93
Total CHOL/HDL Ratio: 3
Triglycerides: 96 mg/dL (ref 0.0–149.0)
VLDL: 19.2 mg/dL (ref 0.0–40.0)

## 2021-09-14 LAB — HEMOGLOBIN A1C: Hgb A1c MFr Bld: 5 % (ref 4.6–6.5)

## 2021-09-14 MED ORDER — TIRZEPATIDE 2.5 MG/0.5ML ~~LOC~~ SOAJ: 2.5000 mg | SUBCUTANEOUS | 2 refills | Status: DC

## 2021-09-14 NOTE — Patient Instructions (Addendum)
It was good to see you again today!  Let's change over to [facility] for you as Reginal Lutes is not available Let me know how you do with it- I can send in the next higher dosage as needed; adjust depending on efficacy and side effects  Please set up your mammo asap I will be in touch with your labs

## 2021-09-16 ENCOUNTER — Telehealth: Payer: Self-pay | Admitting: *Deleted

## 2021-09-16 ENCOUNTER — Other Ambulatory Visit: Payer: Self-pay | Admitting: Family Medicine

## 2021-09-16 DIAGNOSIS — E669 Obesity, unspecified: Secondary | ICD-10-CM

## 2021-09-16 NOTE — Telephone Encounter (Signed)
Please see pharmacy comment

## 2021-09-16 NOTE — Telephone Encounter (Signed)
Prior auth started via cover my meds.  Awaiting determination.  Key: [id number]

## 2021-09-17 ENCOUNTER — Ambulatory Visit: Payer: PRIVATE HEALTH INSURANCE

## 2021-09-17 DIAGNOSIS — M25512 Pain in left shoulder: Secondary | ICD-10-CM

## 2021-09-17 DIAGNOSIS — M25612 Stiffness of left shoulder, not elsewhere classified: Secondary | ICD-10-CM

## 2021-09-17 NOTE — Therapy (Addendum)
OUTPATIENT PHYSICAL THERAPY SHOULDER TREATMENT   Patient Name: April Graham MRN: [id number] DOB:09/10/1980, 41 y.o., female Today's Date: 09/17/2021   PT End of Session - 09/17/21 1315     Visit Number 5    Number of Visits 16    Date for PT Re-Evaluation 10/26/21    Authorization Type WC    Authorization Time Period ?    Progress Note Due on Visit 10    PT Start Time 1100    PT Stop Time 1140    PT Time Calculation (min) 40 min    Activity Tolerance Patient tolerated treatment well;No increased pain    Behavior During Therapy WFL for tasks assessed/performed               Past Medical History:  Diagnosis Date   Anxiety    Arthritis    Irritable bowel syndrome (IBS)    Past Surgical History:  Procedure Laterality Date   ADENOIDECTOMY     KNEE ARTHROSCOPY     KNEE ARTHROSCOPY W/ ACL RECONSTRUCTION     TONSILLECTOMY     WISDOM TOOTH EXTRACTION     There are no problems to display for this patient.   PCP: Copland, Gwenlyn Found, MD  REFERRING PROVIDER: Fredia Sorrow, PA   REFERRING DIAG: M25.519 (ICD-10-CM) - AC joint pain   THERAPY DIAG:  Acute pain of left shoulder  Stiffness of left shoulder, not elsewhere classified  Rationale for Evaluation and Treatment Rehabilitation  ONSET DATE: 06/23/21 ( DOI)  SUBJECTIVE:                                                                                                                                                                                      SUBJECTIVE STATEMENT: HEP going well. Pain elevated in Left UT region. Pt asking about TPDN help with muscles today.   PERTINENT HISTORY: Pt reports Dr. Ave Filter reports she had swelling in her left [facility] joint. 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 Pt is currently on restrictions on pushing, pulling and is restricted to moving less than 10 lb.Pt had injection  in area of AC joint on 08/03/21. Pt is not currently taking any medications for her left shoulder but she had taken a steroid pack and mobic but did not have good results. Pt has had no sharp pains in the joint area since. Pt states she also may have exacerbated the pain when she was playing catch with her nephew but reports most pain as just soreness in area of upper  trap region. 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 She needs to eventually get back to high functional use of the shoulder for readjusting patients, hanging IV lines, ambulation assists, pushing wheelchairs, pushing beds  PAIN:  Are you having pain? Yes: NPRS scale: 4/10 Pain location: posterior shoulder (soreness)   Pain description: sore Aggravating factors: targeted exercise  Relieving factors: ice, ibuprofin   PRECAUTIONS: Shoulder no lifting > 10 lb, no pushing or pulling   WEIGHT BEARING RESTRICTIONS No  FALLS:  Has patient fallen in last 6 months? 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 She needs to eventually get back to high functional use of the shoulder for readjusting patients, hanging IV lines, ambulation assists, pushing wheelchairs, pushing beds.   PLOF: Independent with basic ADLs  PATIENT GOALS Pt would like to get back to full work duty with no restrictions.   OBJECTIVE: (objective measures completed at initial evaluation unless otherwise dated)     TODAY'S TREATMENT:  -Nustep AA/ROM x5 minutes  -TPDN to Left upper trap 10 sticks with mild response only 1x, transition to Left Levator near insertion onscap with immediate significant response, >10 twitches, more symptomatic soress -IASTM to pathway of levator scapula from scap to C7 level Left  -Seated ART-  sustained trigger point release of UT with Rt cervical rotation x10, then release at Lt levator insertion c cervical flexion+rt rotation x15        -standing cable rowdown 1x12 17.5lb -standing BUE scap elecation 50% ROM, 9lb FW bilat  -Red TB BUE GHJ ER 1x15x5secH RTB  -redphysioball pverhead lift adn reach 1x12  *cues for heat athome         PATIENT EDUCATION: Education details: Pt educated throughout session about proper posture and technique with exercises. Improved exercise technique, movement at target joints, use of target muscles after min to mod verbal, visual, tactile cues.    Person educated: Patient Education method: Explanation Education comprehension: verbalized understanding   HOME EXERCISE PROGRAM: Access Code: [email or web address] URL: [email or web address] Date: 09/02/2021 Prepared by: Precious Bard  Exercises - Seated Scapular Retraction  - 1 x daily - 7 x weekly - 2 sets - 10 reps - 5 hold - Seated Upper Trapezius Stretch  - 1 x daily - 7 x weekly - 2 sets - 2 reps - 30 hold - Isometric Shoulder Flexion at Wall  - 1 x daily - 7 x weekly - 2 sets - 10 reps - 5 hold - Isometric Shoulder External Rotation at Wall  - 1 x daily - 7 x weekly - 2 sets - 10 reps - 5 hold - Isometric Shoulder Abduction with Ball at Wall  - 1 x daily - 7 x weekly - 2 sets - 10 reps - 5 hold - Standing Isometric Shoulder Internal Rotation at Wall with Ball  - 1 x daily - 7 x weekly - 2 sets - 10 reps - 5 hold - Standing 'L' Stretch at Counter  - 1 x daily - 7 x weekly - 2 sets - 10 reps - 5 hold   ASSESSMENT:  CLINICAL IMPRESSION: Pt asking about help with UT. Found tightness in Left levator that is more significant. Finished with generalized scapular  strenghtening and coupling of scapulothoracic mobility. Strong response to DN with sorenes day. Encouraged use of heat next 2 days. Pt making progress toward goals in general.     OBJECTIVE IMPAIRMENTS decreased ROM, decreased  strength, and impaired UE functional use.   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   REHAB POTENTIAL: Good  CLINICAL DECISION MAKING: Stable/uncomplicated  EVALUATION COMPLEXITY: Low   GOALS: Goals reviewed with patient? Yes  SHORT TERM GOALS: Target date: 09/28/2021  (Remove Blue Hyperlink)  Patient will be independent in home exercise program to improve strength/mobility for better functional independence with ADLs. Baseline: Goal status: INITIAL  LONG TERM GOALS: Target date: 10/26/2021  (Remove Blue Hyperlink)   Patient will increase FOTO score to equal to or greater than  68   to demonstrate statistically significant improvement in mobility and quality of life.  Baseline: 63 Goal status: INITIAL  2.  Patient will be able to perform household work/ chores without increase in symptoms. Baseline: unable to perform without discomfort and due to MD restrictions Goal status: INITIAL  3.  Patient will report a worst pain of 3/10 on VAS  to improve tolerance with ADLs and reduced symptoms with activities.  Baseline:  Goal status: INITIAL  4.  Patient will improve shoulder AROM to > 140 degrees of flexion, scaption, and abduction without complaints of pain or discomfort  for improved ability to perform overhead activities. Baseline: upper trap region discomfort with end range flexion and abduction  Goal status: INITIAL 5.  Patient will improve shoulder strength to 5/5 without complaints of pain or discomfort   in ER, abduction, and flexion and 4+/5 for mid and lower traps for improved ability to perform overhead activities. Baseline: see objective  Goal status: INITIAL   PLAN: PT FREQUENCY: 1-2x/week  PT DURATION: 8  weeks  PLANNED INTERVENTIONS: Therapeutic exercises, Therapeutic activity, Neuromuscular re-education, Balance training, Gait training, Patient/Family education, Joint mobilization, Dry Needling, Moist heat, Ultrasound, and Manual therapy  PLAN FOR NEXT SESSION: Continue POC, postural retraining when indicated    1:32 PM, 09/17/21 Rosamaria Lints, PT, DPT Physical Therapist - [facility]  Outpatient Physical Therapy- Main Campus [phone]

## 2021-09-18 NOTE — Telephone Encounter (Signed)
Deniedon July 20 This request has not been approved. Based on the information submitted for review, you did not meet our guideline rules for the requested drug. In order for your request to be approved, your provider would need to show that you have met the guideline rules below. The details below are written in medical language. If you have questions, please contact your provider. In some cases, the requested medication or alternatives offered may have additional approval requirements. Our guideline named GLP-1 STEP OVERRIDE (reviewed for [facility]) requires that you have a diagnosis of type 2 diabetes mellitus (a chronic condition in which your body is resistant to insulin, a hormone that regulates your blood sugar). Your doctor requested this medication for weight loss or weight management. This request was denied because your condition does not meet the requirement for approval. Additionally, for patients who have a diagnosis of type 2 diabetes mellitus, approval requires you to try metformin, a metformin combination product, a covered sulfonylurea (such as glimepiride, glipizide, or glyburide), a sulfonylurea combination product, pioglitazone OR a pioglitazone combination product. Please talk with your provider about other treatment options for your condition or get [place] more information if it will allow [place] to approve this request. A written notification letter will follow with additional details.

## 2021-09-21 ENCOUNTER — Other Ambulatory Visit: Payer: Self-pay | Admitting: Family Medicine

## 2021-09-21 ENCOUNTER — Encounter: Payer: Self-pay | Admitting: Family Medicine

## 2021-09-21 DIAGNOSIS — R000 Tachycardia, unspecified: Secondary | ICD-10-CM

## 2021-09-24 ENCOUNTER — Encounter: Payer: No Typology Code available for payment source | Admitting: Physical Therapy

## 2021-09-25 ENCOUNTER — Ambulatory Visit: Payer: PRIVATE HEALTH INSURANCE

## 2021-09-29 ENCOUNTER — Encounter: Payer: No Typology Code available for payment source | Admitting: Physical Therapy

## 2021-10-01 ENCOUNTER — Encounter: Payer: No Typology Code available for payment source | Admitting: Physical Therapy

## 2021-10-02 ENCOUNTER — Ambulatory Visit: Payer: PRIVATE HEALTH INSURANCE | Attending: Family Medicine | Admitting: Physical Therapy

## 2021-10-02 DIAGNOSIS — M25612 Stiffness of left shoulder, not elsewhere classified: Secondary | ICD-10-CM | POA: Diagnosis present

## 2021-10-02 DIAGNOSIS — M25512 Pain in left shoulder: Secondary | ICD-10-CM | POA: Insufficient documentation

## 2021-10-02 NOTE — Therapy (Signed)
OUTPATIENT PHYSICAL THERAPY SHOULDER TREATMENT   Patient Name: April Graham MRN: [id number] DOB:January 16, 1981, 41 y.o., female Today's Date: 10/02/2021   PT End of Session - 10/02/21 1022     Visit Number 6    Number of Visits 16    Date for PT Re-Evaluation 10/26/21    Authorization Type WC    Progress Note Due on Visit 10    PT Start Time 1017    PT Stop Time 1103    PT Time Calculation (min) 46 min    Activity Tolerance Patient tolerated treatment well;No increased pain    Behavior During Therapy WFL for tasks assessed/performed               Past Medical History:  Diagnosis Date   Anxiety    Arthritis    Irritable bowel syndrome (IBS)    Past Surgical History:  Procedure Laterality Date   ADENOIDECTOMY     KNEE ARTHROSCOPY     KNEE ARTHROSCOPY W/ ACL RECONSTRUCTION     TONSILLECTOMY     WISDOM TOOTH EXTRACTION     There are no problems to display for this patient.   PCP: Copland, Gwenlyn Found, MD  REFERRING PROVIDER: Fredia Sorrow, PA   REFERRING DIAG: M25.519 (ICD-10-CM) - AC joint pain   THERAPY DIAG:  Acute pain of left shoulder  Stiffness of left shoulder, not elsewhere classified  Rationale for Evaluation and Treatment Rehabilitation  ONSET DATE: 06/23/21 ( DOI)  SUBJECTIVE:                                                                                                                                                                                      SUBJECTIVE STATEMENT: Patient reports she has some significant pain following previous dry needling bout on visit #5.  Patient also reports she did have another injection to her shoulder as her pain returned and started to experience further signs of shoulder pain and clicking and popping.  Patient reports continued no pain at this time but is concerned of requiring injections for alleviation of her pain and discomfort.   PERTINENT HISTORY: Pt reports Dr. Ave Filter reports she had swelling in  her left [facility] joint. 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 Pt is currently on restrictions on pushing, pulling and is restricted to moving less than 10 lb.Pt had injection in area of AC joint on 08/03/21. Pt is not currently taking any medications for her left shoulder but she had taken a steroid pack and mobic but did not  have good results. Pt has had no sharp pains in the joint area since. Pt states she also may have exacerbated the pain when she was playing catch with her nephew but reports most pain as just soreness in area of upper trap region. 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 She needs to eventually get back to high functional use of the shoulder for readjusting patients, hanging IV lines, ambulation assists, pushing wheelchairs, pushing beds  PAIN:  Are you having pain? Yes: NPRS scale: 4/10 Pain location: posterior shoulder (soreness)   Pain description: sore Aggravating factors: targeted exercise  Relieving factors: ice, ibuprofin   PRECAUTIONS: Shoulder no lifting > 10 lb, no pushing or pulling   WEIGHT BEARING RESTRICTIONS No  FALLS:  Has patient fallen in last 6 months? 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 She needs to eventually get back to high functional use of the shoulder for readjusting patients, hanging IV lines, ambulation assists, pushing wheelchairs, pushing beds.   PLOF: Independent with basic ADLs  PATIENT GOALS Pt would like to get back to full work duty with no restrictions.   OBJECTIVE: (objective measures completed at initial evaluation unless otherwise dated)     UPPER EXTREMITY MMT:  MMT Right eval Left eval   Shoulder flexion 5 4+  Shoulder extension 4+ 4  Shoulder abduction 5 4+  Shoulder adduction    Shoulder internal rotation 5 5  Shoulder external rotation 5 4  Middle trapezius 4+ 4  Lower trapezius 4+ 4  Elbow flexion 5 5  Elbow extension 5 5  (Blank rows = not tested)   TODAY'S TREATMENT:  Manual: Grade II-III mobilizations thoracic spine x 3 minutes GH inferior, AP, PA glides 10x 3 seconds each direction  L shoulder distraction inferior glide 3x30 seconds  Pec minor x 3 minutes L upper trap manual stretch 2 x 60 seconds  STM, ischemic TP release and cross friction massage to rhomboids and middle traps and levator scapulae ( L side)   Therex:   L UE Serratus punches- 2 x 10 x 5 second holds with 1 # hand weight   Reviewed the following for Hep progression   Access Code: [email or web address] URL: [email or web address] Date: 10/02/2021 Prepared by: Thresa Ross  Exercises - Prone Shoulder Extension - Single Arm with 1 #   - 1 x daily - 2-3 x weekly - 2 sets - 10 reps - Prone Shoulder Horizontal Abduction with 1#  - 1 x daily - 2-3 x weekly - 2 sets - 10 reps - Single Arm Serratus Punches in Supine with Dumbbell  - 1 x daily - 2-3 x weekly - 2 sets - 10 reps - Sidelying Shoulder ER with Towel and Dumbbell  (1#) - 1 x daily - 2-3 x weekly - 2 sets - 10 reps - Gentle Levator Scapulae Stretch  - 1 x daily - 7 x weekly - 2 sets - 35 sec hold    TherEx: Prone  left shoulder extension x -2 x 10 1 # weight, no pain noted, but some weakness felt  Prone  horizontal abduction L shoulder x 10 reps no weight  -2 x 10 1 # weight, no pain noted, but some  weakness felt  Prone row with some abduction with 2# weight 2 x 10 reps      PATIENT EDUCATION: Education details: Pt educated throughout session about proper posture and technique with exercises. Improved exercise technique, movement at target joints, use of target muscles after min to mod verbal, visual, tactile cues.     Person educated: Patient Education method: Explanation Education comprehension: verbalized understanding   HOME EXERCISE PROGRAM: Access Code: [email or web address] URL: [email or web address] Date: 09/02/2021 Prepared by: Precious Bard  Exercises - Seated Scapular Retraction  - 1 x daily - 7 x weekly - 2 sets - 10 reps - 5 hold - Seated Upper Trapezius Stretch  - 1 x daily - 7 x weekly - 2 sets - 2 reps - 30 hold - Isometric Shoulder Flexion at Wall  - 1 x daily - 7 x weekly - 2 sets - 10 reps - 5 hold - Isometric Shoulder External Rotation at Wall  - 1 x daily - 7 x weekly - 2 sets - 10 reps - 5 hold - Isometric Shoulder Abduction with Ball at Wall  - 1 x daily - 7 x weekly - 2 sets - 10 reps - 5 hold - Standing Isometric Shoulder Internal Rotation at Wall with Ball  - 1 x daily - 7 x weekly - 2 sets - 10 reps - 5 hold - Standing 'L' Stretch at Counter  - 1 x daily - 7 x weekly - 2 sets - 10 reps - 5 hold  - Gentle Levator Scapulae Stretch  - 1 x daily - 7 x weekly - 2 sets - 35 sec hold    Completing below HEP 2-3 times a week and above HEP daily    Access Code: [email or web address] URL: [email or web address] Date: 10/02/2021 Prepared by: Thresa Ross  Exercises - Prone Shoulder Extension - Single Arm  - 1 x daily - 2-3 x weekly - 2 sets - 10 reps - Prone Shoulder Horizontal Abduction  - 1 x daily - 2-3 x weekly - 2 sets - 10 reps - Single Arm Serratus Punches in Supine with Dumbbell  - 1 x daily - 2-3 x weekly - 2 sets - 10 reps - Sidelying Shoulder ER with Towel and Dumbbell  - 1 x daily - 2-3 x weekly - 2 sets - 10 reps  ASSESSMENT:  CLINICAL IMPRESSION: Patient presents to physical therapy with great motivation for completion of activities.  Continued with shoulder strengthening this date with good overall results.  Patient did have to have another injection recently due to increased pain in the shoulder patient also had negative response to trigger point dry  needling and her doctor no longer recommends this intervention in future sessions.  Patient having no pain at this time but encouraged to inform physical therapist if any activities do cause increase in pain particularly in her anterior shoulder.  Patient has significant tightness in left upper trap and levator Scap and responds well to stretching and trigger point release activities with no negative response at this time. Pt will continue to benefit from skilled physical therapy intervention to address impairments, improve QOL, and attain therapy goals.     OBJECTIVE IMPAIRMENTS decreased ROM, decreased strength, and impaired UE functional use.   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  REHAB POTENTIAL: Good  CLINICAL DECISION MAKING: Stable/uncomplicated  EVALUATION COMPLEXITY: Low   GOALS: Goals reviewed with patient? Yes  SHORT TERM GOALS: Target date: 09/28/2021  (Remove Blue Hyperlink)  Patient will be independent in home exercise program to improve strength/mobility for better functional independence with ADLs. Baseline: Goal status: INITIAL  LONG TERM GOALS: Target date: 10/26/2021  (Remove Blue Hyperlink)   Patient will increase FOTO score to equal to or greater than  68   to demonstrate statistically significant improvement in mobility and quality of life.  Baseline: 63 Goal status: INITIAL  2.  Patient will be able to perform household work/ chores without increase in symptoms. Baseline: unable to perform without discomfort and due to MD restrictions Goal status: INITIAL  3.  Patient will report a worst pain of 3/10 on VAS  to improve tolerance with ADLs and reduced symptoms with activities.  Baseline:  Goal  status: INITIAL  4.  Patient will improve shoulder AROM to > 140 degrees of flexion, scaption, and abduction without complaints of pain or discomfort  for improved ability to perform overhead activities. Baseline: upper trap region discomfort with end range flexion and abduction  Goal status: INITIAL 5.  Patient will improve shoulder strength to 5/5 without complaints of pain or discomfort   in ER, abduction, and flexion and 4+/5 for mid and lower traps for improved ability to perform overhead activities. Baseline: see objective  Goal status: INITIAL   PLAN: PT FREQUENCY: 1-2x/week  PT DURATION: 8 weeks  PLANNED INTERVENTIONS: Therapeutic exercises, Therapeutic activity, Neuromuscular re-education, Balance training, Gait training, Patient/Family education, Joint mobilization, Dry Needling, Moist heat, Ultrasound, and Manual therapy  PLAN FOR NEXT SESSION: No more dry needling, continue plan of care, postural retraining will continue to be beneficial.   Norman Herrlich, PT 10/02/2021, 11:18 AM

## 2021-10-05 ENCOUNTER — Encounter: Payer: No Typology Code available for payment source | Admitting: Physical Therapy

## 2021-10-08 ENCOUNTER — Encounter: Payer: No Typology Code available for payment source | Admitting: Physical Therapy

## 2021-10-09 ENCOUNTER — Ambulatory Visit: Payer: No Typology Code available for payment source | Admitting: Physical Therapy

## 2021-10-12 ENCOUNTER — Encounter: Payer: No Typology Code available for payment source | Admitting: Physical Therapy

## 2021-10-13 ENCOUNTER — Ambulatory Visit: Payer: PRIVATE HEALTH INSURANCE

## 2021-10-13 ENCOUNTER — Ambulatory Visit: Payer: No Typology Code available for payment source | Admitting: Physical Therapy

## 2021-10-13 DIAGNOSIS — M25512 Pain in left shoulder: Secondary | ICD-10-CM

## 2021-10-13 DIAGNOSIS — M25612 Stiffness of left shoulder, not elsewhere classified: Secondary | ICD-10-CM

## 2021-10-13 NOTE — Therapy (Signed)
OUTPATIENT PHYSICAL THERAPY SHOULDER TREATMENT   Patient Name: April Graham MRN: [id number] DOB:1980-09-18, 41 y.o., female Today's Date: 10/13/2021       Past Medical History:  Diagnosis Date   Anxiety    Arthritis    Irritable bowel syndrome (IBS)    Past Surgical History:  Procedure Laterality Date   ADENOIDECTOMY     KNEE ARTHROSCOPY     KNEE ARTHROSCOPY W/ ACL RECONSTRUCTION     TONSILLECTOMY     WISDOM TOOTH EXTRACTION     There are no problems to display for this patient.   PCP: Copland, Gwenlyn Found, MD  REFERRING PROVIDER: Fredia Sorrow, PA   REFERRING DIAG: M25.519 (ICD-10-CM) - AC joint pain   THERAPY DIAG:  No diagnosis found.  Rationale for Evaluation and Treatment Rehabilitation  ONSET DATE: 06/23/21 ( DOI)  SUBJECTIVE:                                                                                                                                                                                      SUBJECTIVE STATEMENT: Pt reports feeling frustrated today with shoulder pain and soreness. Notes improvements in pain/soreness after last injection. Continues to have pain radiating from UT into neck and down into shoulder. Pt notes that she thinks she might have irritated it yesterday at work, pushing patients in [facility]. States she feels like she holds stress in her shoulders.  PERTINENT HISTORY: Pt reports Dr. Ave Filter reports she had swelling in her left [facility] joint. 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 Pt is currently on restrictions on pushing, pulling and is restricted to moving less than 10 lb.Pt had injection in area of AC joint on 08/03/21. Pt is not currently taking any medications for her left shoulder but she had taken a steroid pack and mobic but did not have good results. Pt has had no sharp pains in the joint area since. Pt states she also may have  exacerbated the pain when she was playing catch with her nephew but reports most pain as just soreness in area of upper trap region. 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 She needs to eventually get back to high functional use of the shoulder for readjusting patients, hanging IV lines, ambulation assists, pushing wheelchairs, pushing beds  PAIN:  Are you having pain? Yes: NPRS scale: 2/10 Pain location: along L UT distribution  Pain description: sore  PRECAUTIONS: Shoulder no lifting > 10 lb, no pushing or pulling   WEIGHT  BEARING RESTRICTIONS No  FALLS:  Has patient fallen in last 6 months? 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 She needs to eventually get back to high functional use of the shoulder for readjusting patients, hanging IV lines, ambulation assists, pushing wheelchairs, pushing beds.   PLOF: Independent with basic ADLs  PATIENT GOALS Pt would like to get back to full work duty with no restrictions.   OBJECTIVE: (objective measures completed at initial evaluation unless otherwise dated)     UPPER EXTREMITY MMT:  MMT Right eval Left eval  Shoulder flexion 5 4+  Shoulder extension 4+ 4  Shoulder abduction 5 4+  Shoulder adduction    Shoulder internal rotation 5 5  Shoulder external rotation 5 4  Middle trapezius 4+ 4  Lower trapezius 4+ 4  Elbow flexion 5 5  Elbow extension 5 5  (Blank rows = not tested)   TODAY'S TREATMENT:  Manual:  Pec minor x 2 minutes L upper trap manual stretch 2 x 60 seconds  L levator scapulae stretch 2x60s STM, ischemic TP release and cross friction massage to L UT and levator scapulae  Therex:  L UE Serratus punches- 2 x 10 x 5 second holds with 1 # hand weight  Prone  left shoulder  extension 3x5 2# weight, no pain noted, but some weakness felt  Prone  horizontal abduction L shoulder 2x5 2# weight, no pain noted, but some weakness felt   Not Performed Today: Grade II-III mobilizations thoracic spine x 3 minutes GH inferior, AP, PA glides 10x 3 seconds each direction  L shoulder distraction inferior glide 3x30 seconds Prone row with some abduction with 2# weight 2 x 10 reps      PATIENT EDUCATION: Education details: Pt educated throughout session about proper posture and technique with exercises. Improved exercise technique, movement at target joints, use of target muscles after min to mod verbal, visual, tactile cues. Increasing weight/intensity at home pending pain/DOMS from PT session. Performing scapular add and depression with pulling/pushing at work to decrease UT activation.    Person educated: Patient Education method: Explanation Education comprehension: verbalized understanding   HOME EXERCISE PROGRAM: 10/13/21: Access Code: [email or web address]     ASSESSMENT:  CLINICAL IMPRESSION: Pt tolerated increased intensity of interventions today, without increased c/o pain. Treatment initiated with passive stretching of tight musculature as well as STM to address TrP, noting improved muscle tension. Pt educated regarding proper techniques for pushing/pulling/lifting as well as handout for proper desk ergonomics. Mild increased pain levels post session rated as 2-3/10. Pt will continue to benefit from skilled OPPT services to address deficits for improved tolerance for ADL's, IADL's, and return to work.    OBJECTIVE IMPAIRMENTS decreased ROM, decreased strength, and impaired UE functional use.   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   REHAB POTENTIAL: Good  CLINICAL DECISION MAKING: Stable/uncomplicated  EVALUATION COMPLEXITY: Low   GOALS: Goals reviewed with patient? Yes  SHORT TERM GOALS: Target date: 09/28/2021  (Remove Blue Hyperlink)  Patient will be independent in home exercise program  to improve strength/mobility for better functional independence with ADLs. Baseline: Goal status: INITIAL  LONG TERM GOALS: Target date: 10/26/2021  (Remove Blue Hyperlink)   Patient will increase FOTO score to equal to or greater than  68   to demonstrate statistically significant improvement in mobility and quality of life.  Baseline: 63 Goal status: INITIAL  2.  Patient will be able to perform household work/ chores without increase in symptoms. Baseline: unable to perform without discomfort and due to MD restrictions Goal status: INITIAL  3.  Patient will report a worst pain of 3/10 on VAS  to improve tolerance with ADLs and reduced symptoms with activities.  Baseline:  Goal status: INITIAL  4.  Patient will improve shoulder AROM to > 140 degrees of flexion, scaption, and abduction without complaints of pain or discomfort  for improved ability to perform overhead activities. Baseline: upper trap region discomfort with end range flexion and abduction  Goal status: INITIAL 5.  Patient will improve shoulder strength to 5/5 without complaints of pain or discomfort   in ER, abduction, and flexion and 4+/5 for mid and lower traps for improved ability to perform overhead activities. Baseline: see objective  Goal status: INITIAL   PLAN: PT FREQUENCY: 1-2x/week  PT DURATION: 8 weeks  PLANNED INTERVENTIONS: Therapeutic exercises, Therapeutic activity, Neuromuscular re-education, Balance training, Gait training, Patient/Family education, Joint mobilization, Dry Needling, Moist heat, Ultrasound, and Manual therapy  PLAN FOR NEXT SESSION: No more dry needling, continue plan of care, postural  retraining will continue to be beneficial.      Vira Blanco, PT, DPT 11:06 AM,10/13/21 Physical Therapist - [facility]

## 2021-10-14 ENCOUNTER — Encounter: Payer: No Typology Code available for payment source | Admitting: Physical Therapy

## 2021-10-19 ENCOUNTER — Ambulatory Visit: Payer: PRIVATE HEALTH INSURANCE

## 2021-10-19 ENCOUNTER — Other Ambulatory Visit (HOSPITAL_COMMUNITY): Payer: Self-pay | Admitting: Orthopedic Surgery

## 2021-10-19 ENCOUNTER — Encounter: Payer: Self-pay | Admitting: Physical Therapy

## 2021-10-19 ENCOUNTER — Other Ambulatory Visit: Payer: Self-pay | Admitting: Orthopedic Surgery

## 2021-10-19 DIAGNOSIS — M25612 Stiffness of left shoulder, not elsewhere classified: Secondary | ICD-10-CM

## 2021-10-19 DIAGNOSIS — M25512 Pain in left shoulder: Secondary | ICD-10-CM

## 2021-10-19 NOTE — Therapy (Signed)
OUTPATIENT PHYSICAL THERAPY SHOULDER RE-CERT   Patient Name: April Graham MRN: [id number] DOB:1980-05-03, 41 y.o., female Today's Date: 10/19/2021   PT End of Session - 10/19/21 0857     Visit Number 8    Number of Visits 16   Based on [facility] visits approved   Date for PT Re-Evaluation 12/14/21    Authorization Type WC    Authorization Time Period Will need new auth    Progress Note Due on Visit 10    PT Start Time [id number]    PT Stop Time 0930    PT Time Calculation (min) 36 min    Activity Tolerance Patient tolerated treatment well;Patient limited by pain    Behavior During Therapy WFL for tasks assessed/performed                Past Medical History:  Diagnosis Date   Anxiety    Arthritis    Irritable bowel syndrome (IBS)    Past Surgical History:  Procedure Laterality Date   ADENOIDECTOMY     KNEE ARTHROSCOPY     KNEE ARTHROSCOPY W/ ACL RECONSTRUCTION     TONSILLECTOMY     WISDOM TOOTH EXTRACTION     There are no problems to display for this patient.   PCP: Copland, Gwenlyn Found, MD  REFERRING PROVIDER: Fredia Sorrow, PA   REFERRING DIAG: M25.519 (ICD-10-CM) - AC joint pain   THERAPY DIAG:  Stiffness of left shoulder, not elsewhere classified  Acute pain of left shoulder  Rationale for Evaluation and Treatment Rehabilitation  ONSET DATE: 06/23/21 ( DOI)  SUBJECTIVE:                                                                                                                                                                                      SUBJECTIVE STATEMENT: Pt reports increased pain up to 7/10 pain NPS isolated to L AC joint. Pt very upset and frustrated as pt reports no activity has aggravated symptoms as pt has been trying to relax it the last couple days.   PERTINENT HISTORY: Pt reports Dr. Ave Filter reports she had swelling in her left [facility] joint. 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 Pt is currently on restrictions on pushing, pulling and is restricted to moving less than 10 lb.Pt had injection in area of AC joint on 08/03/21. Pt is not currently taking any medications for her left shoulder but she had taken a steroid pack and mobic but did not have good results. Pt has had no sharp pains  in the joint area since. Pt states she also may have exacerbated the pain when she was playing catch with her nephew but reports most pain as just soreness in area of upper trap region. 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 She needs to eventually get back to high functional use of the shoulder for readjusting patients, hanging IV lines, ambulation assists, pushing wheelchairs, pushing beds  PAIN:  Are you having pain? Yes: NPRS scale: 2/10 Pain location: along L UT distribution  Pain description: sore  PRECAUTIONS: Shoulder no lifting > 10 lb, no pushing or pulling   WEIGHT BEARING RESTRICTIONS No  FALLS:  Has patient fallen in last 6 months? 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 She needs to eventually get back to high functional use of the shoulder for readjusting patients, hanging IV lines, ambulation assists, pushing wheelchairs, pushing beds.   PLOF: Independent with basic ADLs  PATIENT GOALS Pt would like to get back to full work duty with no restrictions.   OBJECTIVE: (objective measures completed at initial evaluation unless otherwise dated)     UPPER EXTREMITY MMT:  MMT Right eval Left eval  Shoulder flexion 5 4+  Shoulder extension 4+ 4  Shoulder abduction 5 4+  Shoulder adduction    Shoulder internal rotation 5 5  Shoulder external rotation 5 4  Middle trapezius 4+ 4   Lower trapezius 4+ 4  Elbow flexion 5 5  Elbow extension 5 5  (Blank rows = not tested)   TODAY'S TREATMENT:  10/19/21  Reassessment of goals. See long term goals section for details . Education on sleeping postures and comfortable LUE shoulder use to prevent AC joint compression for symptom management. Use of ice to decrease inflammation. 5 minutes seated during education to L shoulder.      PATIENT EDUCATION: Education details: Progress towards goals, ways to prevent AC joint compression for pain relief, use of ice for inflammation. Person educated: Patient Education method: Explanation Education comprehension: verbalized understanding   HOME EXERCISE PROGRAM: 10/13/21: Access Code: [email or web address]     ASSESSMENT:  CLINICAL IMPRESSION: Pt at end of POC. Reassessment of goals performed. Pt decreased in FOTO score as pt has had recent flare up of L shoulder pain. Otherwise pt has been independent with HEP completion, has improved with shoulder AROM and able to complete household chores and noted improvement in strength to 5/5 via MMT. However pt has not accomplished these goals due to pain with all activities. Education provided on postural positions, sleeping posture, and ice to self manage increased L AC joint pain. PT to request additional 8 visits as pt remains pain limited which will prevent pt from full participation with physical nursing duties.     OBJECTIVE IMPAIRMENTS decreased ROM, decreased strength, and impaired UE functional use.   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   REHAB POTENTIAL: Good  CLINICAL DECISION MAKING: Stable/uncomplicated  EVALUATION COMPLEXITY: Low   GOALS: Goals reviewed with patient?  Yes  SHORT TERM GOALS: Target date:  09/28/2021  (Remove Blue Hyperlink)  Patient will be independent in home exercise program to improve strength/mobility for better functional independence with ADLs. Baseline:  Goal status: COMPLETE 10/19/21  LONG TERM GOALS: Target date: 10/26/2021  (Remove Blue Hyperlink)   Patient will increase FOTO score to equal to or greater than  68   to demonstrate statistically significant improvement in mobility and quality of life.  Baseline: 63; 10/19/21 57 Goal status: IN PROGRESS  2.  Patient will be able to perform household work/ chores without increase in symptoms. Baseline: unable to perform without discomfort and due to MD restrictions; 10/19/21 unable to use LUE due to pain. Up until the last day or two.  Goal status: IN PROGRESS  3.  Patient will report a worst pain of 3/10 on VAS  to improve tolerance with ADLs and reduced symptoms with activities.  Baseline: 10/19/21: 7/10 with movement Goal status: IN PROGRESS  4.  Patient will improve shoulder AROM to > 140 degrees of flexion, scaption, and abduction without complaints of pain or discomfort  for improved ability to perform overhead activities. Baseline: upper trap region discomfort with end range flexion and abduction; 10/19/21: LUE flex: 172 deg,  abduction: 180 deg, scaption:  180 deg. Does have pain up to 5/10 NPS Goal status: IN PROGRESS  5.  Patient will improve shoulder strength to 5/5 without complaints of pain or discomfort   in ER, abduction, and flexion and 4+/5 for mid and lower traps for improved ability to perform overhead activities. Baseline: has pain with all motions, but does have improved strength to 5/5 in all motions Goal status: IN PROGRESS   PLAN: PT FREQUENCY: 1-2x/week  PT DURATION: 8 weeks  PLANNED INTERVENTIONS: Therapeutic exercises, Therapeutic activity, Neuromuscular re-education, Balance training, Gait training, Patient/Family education, Joint mobilization, Dry  Needling, Moist heat, Ultrasound, and Manual therapy  PLAN FOR NEXT SESSION: No more dry needling, continue plan of care, postural retraining will continue to be beneficial.      Delphia Grates. Fairly IV, PT, DPT Physical Therapist- [place]

## 2021-10-20 ENCOUNTER — Encounter: Payer: No Typology Code available for payment source | Admitting: Physical Therapy

## 2021-10-22 ENCOUNTER — Other Ambulatory Visit: Payer: Self-pay | Admitting: Orthopedic Surgery

## 2021-10-22 DIAGNOSIS — M25512 Pain in left shoulder: Secondary | ICD-10-CM

## 2021-10-23 ENCOUNTER — Encounter: Payer: No Typology Code available for payment source | Admitting: Physical Therapy

## 2021-10-26 ENCOUNTER — Ambulatory Visit: Admission: RE | Admit: 2021-10-26 | Payer: Worker's Compensation | Source: Ambulatory Visit

## 2021-10-26 ENCOUNTER — Ambulatory Visit
Admission: RE | Admit: 2021-10-26 | Discharge: 2021-10-26 | Disposition: A | Discharge status: Home / Self Care | Payer: PRIVATE HEALTH INSURANCE | Source: Ambulatory Visit | Attending: Orthopedic Surgery | Admitting: Orthopedic Surgery

## 2021-10-26 DIAGNOSIS — M25512 Pain in left shoulder: Secondary | ICD-10-CM | POA: Insufficient documentation

## 2021-11-16 ENCOUNTER — Other Ambulatory Visit: Payer: Self-pay | Admitting: Orthopedic Surgery

## 2021-11-16 ENCOUNTER — Encounter: Payer: No Typology Code available for payment source | Admitting: Physical Therapy

## 2021-11-17 NOTE — Progress Notes (Signed)
Sent message, via epic in basket, requesting orders in epic from surgeon.  

## 2021-11-18 ENCOUNTER — Encounter (HOSPITAL_COMMUNITY): Payer: Self-pay

## 2021-11-18 NOTE — Progress Notes (Signed)
COVID Vaccine Completed:  Yes  Date of COVID positive in last 90 days:  PCP - Lamar Blinks, MD Cardiologist -   Chest x-ray - 04-07-21 Epic EKG - 04-07-21 Epic Stress Test -  ECHO -  Cardiac Cath -  Pacemaker/ICD device last checked: Spinal Cord Stimulator: Long Term Monitor - 2023 Epic  Bowel Prep -   Sleep Study -  CPAP -   Fasting Blood Sugar -  Checks Blood Sugar _____ times a day  Blood Thinner Instructions: Aspirin Instructions: Last Dose:  Activity level:  Can go up a flight of stairs and perform activities of daily living without stopping and without symptoms of chest pain or shortness of breath.  Able to exercise without symptoms  Unable to go up a flight of stairs without symptoms of     Anesthesia review: Chest pain evaluated in ER on 04-07-21.  Tachycardia evaluated by heart monitor  Patient denies shortness of breath, fever, cough and chest pain at PAT appointment  Patient verbalized understanding of instructions that were given to them at the PAT appointment. Patient was also instructed that they will need to review over the PAT instructions again at home before surgery.

## 2021-11-18 NOTE — Patient Instructions (Signed)
SURGICAL WAITING ROOM VISITATION Patients having surgery or a procedure may have no more than 2 support people in the waiting area - these visitors may rotate.   Children under the age of 56 must have an adult with them who is not the patient. If the patient needs to stay at the hospital during part of their recovery, the visitor guidelines for inpatient rooms apply. Pre-op nurse will coordinate an appropriate time for 1 support person to accompany patient in pre-op.  This support person may not rotate.    Please refer to the [facility] website for the visitor guidelines for Inpatients (after your surgery is over and you are in a regular room).      Your procedure is scheduled on: 11-26-21   Report to [facility] Main Entrance    Report to admitting at 7:30 AM   Call this number if you have problems the morning of surgery [phone]   Do not eat food :After Midnight.   After Midnight you may have the following liquids until 6:50 AM DAY OF SURGERY  Water Non-Citrus Juices (without pulp, NO RED) Carbonated Beverages Black Coffee (NO MILK/CREAM OR CREAMERS, sugar ok)  Clear Tea (NO MILK/CREAM OR CREAMERS, sugar ok) regular and decaf                             Plain Jell-O (NO RED)                                           Fruit ices (not with fruit pulp, NO RED)                                     Popsicles (NO RED)                                                               Sports drinks like Gatorade (NO RED)                   The day of surgery:  Drink ONE (1) Pre-Surgery Clear Ensure at 6:50 AM the morning of surgery. Drink in one sitting. Do not sip.  This drink was given to you during your hospital  pre-op appointment visit. Nothing else to drink after completing the Pre-Surgery Clear Ensure          If you have questions, please contact your surgeon's office.   FOLLOW ANY ADDITIONAL PRE OP INSTRUCTIONS YOU RECEIVED FROM YOUR SURGEON'S OFFICE!!!     Oral  Hygiene is also important to reduce your risk of infection.                                    Remember - BRUSH YOUR TEETH THE MORNING OF SURGERY WITH YOUR REGULAR TOOTHPASTE   Do NOT smoke after Midnight   Take these medicines the morning of surgery with A SIP OF WATER:  Lorazepam  You may not have any metal on your body including hair pins, jewelry, and body piercing             Do not wear make-up, lotions, powders, perfumes or deodorant  Do not wear nail polish including gel and S&S, artificial/acrylic nails, or any other type of covering on natural nails including finger and toenails. If you have artificial nails, gel coating, etc. that needs to be removed by a nail salon please have this removed prior to surgery or surgery may need to be canceled/ delayed if the surgeon/ anesthesia feels like they are unable to be safely monitored.   Do not shave  48 hours prior to surgery.         Do not bring valuables to the hospital. [facility] IS NOT RESPONSIBLE   FOR VALUABLES.   Contacts, dentures or bridgework may not be worn into surgery.   DO NOT BRING YOUR HOME MEDICATIONS TO THE HOSPITAL. PHARMACY WILL DISPENSE MEDICATIONS LISTED ON YOUR MEDICATION LIST TO YOU DURING YOUR ADMISSION IN THE HOSPITAL!   Patients discharged on the day of surgery will not be allowed to drive home.  Someone NEEDS to stay with you for the first 24 hours after anesthesia.  Special Instructions: Bring a copy of your healthcare power of attorney and living will documents the day of surgery if you haven't scanned them before.  Please read over the following fact sheets you were given: IF YOU HAVE QUESTIONS ABOUT YOUR PRE-OP INSTRUCTIONS PLEASE CALL [phone] Gwen  If you received a COVID test during your pre-op visit  it is requested that you wear a mask when out in public, stay away from anyone that may not be feeling well and notify your surgeon if you develop symptoms. If you test  positive for Covid or have been in contact with anyone that has tested positive in the last 10 days please notify you surgeon.  [facility] - Preparing for Surgery Before surgery, you can play an important role.  Because skin is not sterile, your skin needs to be as free of germs as possible.  You can reduce the number of germs on your skin by washing with CHG (chlorahexidine gluconate) soap before surgery.  CHG is an antiseptic cleaner which kills germs and bonds with the skin to continue killing germs even after washing. Please DO NOT use if you have an allergy to CHG or antibacterial soaps.  If your skin becomes reddened/irritated stop using the CHG and inform your nurse when you arrive at Short Stay. Do not shave (including legs and underarms) for at least 48 hours prior to the first CHG shower.  You may shave your face/neck.  Please follow these instructions carefully:  1.  Shower with CHG Soap the night before surgery and the  morning of surgery.  2.  If you choose to wash your hair, wash your hair first as usual with your normal  shampoo.  3.  After you shampoo, rinse your hair and body thoroughly to remove the shampoo.                             4.  Use CHG as you would any other liquid soap.  You can apply chg directly to the skin and wash.  Gently with a scrungie or clean washcloth.  5.  Apply the CHG Soap to your body ONLY FROM THE NECK DOWN.   Do   not  use on face/ open                           Wound or open sores. Avoid contact with eyes, ears mouth and   genitals (private parts).                       Wash face,  Genitals (private parts) with your normal soap.             6.  Wash thoroughly, paying special attention to the area where your    surgery  will be performed.  7.  Thoroughly rinse your body with warm water from the neck down.  8.  DO NOT shower/wash with your normal soap after using and rinsing off the CHG Soap.                9.  Pat yourself dry with a clean towel.             10.  Wear clean pajamas.            11.  Place clean sheets on your bed the night of your first shower and do not  sleep with pets. Day of Surgery : Do not apply any lotions/deodorants the morning of surgery.  Please wear clean clothes to the hospital/surgery center.  FAILURE TO FOLLOW THESE INSTRUCTIONS MAY RESULT IN THE CANCELLATION OF YOUR SURGERY  PATIENT SIGNATURE_________________________________  NURSE SIGNATURE__________________________________  ________________________________________________________________________

## 2021-11-19 ENCOUNTER — Other Ambulatory Visit: Payer: Self-pay

## 2021-11-19 ENCOUNTER — Encounter (HOSPITAL_COMMUNITY)
Admission: RE | Admit: 2021-11-19 | Discharge: 2021-11-19 | Disposition: A | Discharge status: Home / Self Care | Payer: No Typology Code available for payment source | Source: Ambulatory Visit | Attending: Orthopedic Surgery | Admitting: Orthopedic Surgery

## 2021-11-19 ENCOUNTER — Encounter (HOSPITAL_COMMUNITY): Payer: Self-pay

## 2021-11-19 VITALS — BP 144/88 | HR 84 | Temp 98.5°F | Resp 16 | Ht 72.0 in | Wt 270.8 lb

## 2021-11-19 DIAGNOSIS — Z01818 Encounter for other preprocedural examination: Secondary | ICD-10-CM | POA: Insufficient documentation

## 2021-11-19 DIAGNOSIS — I2510 Atherosclerotic heart disease of native coronary artery without angina pectoris: Secondary | ICD-10-CM | POA: Insufficient documentation

## 2021-11-19 HISTORY — DX: Palpitations: R00.2

## 2021-11-19 HISTORY — DX: Nausea with vomiting, unspecified: R11.2

## 2021-11-19 HISTORY — DX: Other specified postprocedural states: Z98.890

## 2021-11-19 HISTORY — DX: Family history of other specified conditions: Z84.89

## 2021-11-19 LAB — BASIC METABOLIC PANEL
Anion gap: 4 — ABNORMAL LOW (ref 5–15)
BUN: 17 mg/dL (ref 6–20)
CO2: 24 mmol/L (ref 22–32)
Calcium: 8.9 mg/dL (ref 8.9–10.3)
Chloride: 110 mmol/L (ref 98–111)
Creatinine, Ser: 0.64 mg/dL (ref 0.44–1.00)
GFR, Estimated: >60 mL/min (ref >60)
Glucose, Bld: 96 mg/dL (ref 70–99)
Potassium: 4.4 mmol/L (ref 3.5–5.1)
Sodium: 138 mmol/L (ref 135–145)

## 2021-11-25 ENCOUNTER — Encounter (HOSPITAL_COMMUNITY): Payer: Self-pay | Admitting: Orthopedic Surgery

## 2021-11-26 ENCOUNTER — Other Ambulatory Visit: Payer: Self-pay

## 2021-11-26 ENCOUNTER — Encounter (HOSPITAL_COMMUNITY): Payer: Self-pay | Admitting: Orthopedic Surgery

## 2021-11-26 ENCOUNTER — Encounter (HOSPITAL_COMMUNITY)
Admission: RE | Disposition: A | Discharge status: Home / Self Care | Payer: Self-pay | Source: Ambulatory Visit | Attending: Orthopedic Surgery

## 2021-11-26 ENCOUNTER — Ambulatory Visit (HOSPITAL_COMMUNITY): Payer: PRIVATE HEALTH INSURANCE | Admitting: Physician Assistant

## 2021-11-26 ENCOUNTER — Ambulatory Visit (HOSPITAL_BASED_OUTPATIENT_CLINIC_OR_DEPARTMENT_OTHER): Payer: PRIVATE HEALTH INSURANCE | Admitting: Anesthesiology

## 2021-11-26 ENCOUNTER — Ambulatory Visit (HOSPITAL_COMMUNITY)
Admission: RE | Admit: 2021-11-26 | Discharge: 2021-11-26 | Disposition: A | Discharge status: Home / Self Care | Payer: PRIVATE HEALTH INSURANCE | Source: Ambulatory Visit | Attending: Orthopedic Surgery | Admitting: Orthopedic Surgery

## 2021-11-26 DIAGNOSIS — E669 Obesity, unspecified: Secondary | ICD-10-CM | POA: Insufficient documentation

## 2021-11-26 DIAGNOSIS — Z6836 Body mass index (BMI) 36.0-36.9, adult: Secondary | ICD-10-CM | POA: Insufficient documentation

## 2021-11-26 DIAGNOSIS — M25812 Other specified joint disorders, left shoulder: Secondary | ICD-10-CM | POA: Diagnosis not present

## 2021-11-26 DIAGNOSIS — I2510 Atherosclerotic heart disease of native coronary artery without angina pectoris: Secondary | ICD-10-CM

## 2021-11-26 DIAGNOSIS — M19012 Primary osteoarthritis, left shoulder: Secondary | ICD-10-CM | POA: Insufficient documentation

## 2021-11-26 DIAGNOSIS — M7542 Impingement syndrome of left shoulder: Secondary | ICD-10-CM

## 2021-11-26 DIAGNOSIS — M75112 Incomplete rotator cuff tear or rupture of left shoulder, not specified as traumatic: Secondary | ICD-10-CM | POA: Diagnosis not present

## 2021-11-26 DIAGNOSIS — F419 Anxiety disorder, unspecified: Secondary | ICD-10-CM | POA: Diagnosis not present

## 2021-11-26 DIAGNOSIS — Z01818 Encounter for other preprocedural examination: Secondary | ICD-10-CM

## 2021-11-26 LAB — POCT PREGNANCY, URINE: Preg Test, Ur: NEGATIVE

## 2021-11-26 SURGERY — SHOULDER ARTHROSCOPY WITH SUBACROMIAL DECOMPRESSION AND DISTAL CLAVICLE EXCISION
Anesthesia: General | Site: Shoulder | Laterality: Left

## 2021-11-26 MED ORDER — ROCURONIUM BROMIDE 10 MG/ML (PF) SYRINGE
PREFILLED_SYRINGE | INTRAVENOUS | Status: DC | PRN
  Administered 2021-11-26: 50 mg via INTRAVENOUS
  Administered 2021-11-26: 10 mg via INTRAVENOUS

## 2021-11-26 MED ORDER — ORAL CARE MOUTH RINSE: 15.0000 mL | Freq: Once | OROMUCOSAL | Status: AC

## 2021-11-26 MED ORDER — FENTANYL CITRATE PF 50 MCG/ML IJ SOSY
50.0000 ug | PREFILLED_SYRINGE | Freq: Once | INTRAMUSCULAR | Status: AC
  Administered 2021-11-26: 50 ug via INTRAVENOUS
  Filled 2021-11-26: qty 2

## 2021-11-26 MED ORDER — LACTATED RINGERS IV SOLN: INTRAVENOUS | Status: DC

## 2021-11-26 MED ORDER — FENTANYL CITRATE PF 50 MCG/ML IJ SOSY: 25.0000 ug | PREFILLED_SYRINGE | INTRAMUSCULAR | Status: DC | PRN

## 2021-11-26 MED ORDER — CHLORHEXIDINE GLUCONATE 0.12 % MT SOLN
15.0000 mL | Freq: Once | OROMUCOSAL | Status: AC
  Administered 2021-11-26: 15 mL via OROMUCOSAL

## 2021-11-26 MED ORDER — DEXAMETHASONE SODIUM PHOSPHATE 10 MG/ML IJ SOLN
INTRAMUSCULAR | Status: DC | PRN
  Administered 2021-11-26: 10 mg via INTRAVENOUS

## 2021-11-26 MED ORDER — SUGAMMADEX SODIUM 200 MG/2ML IV SOLN
INTRAVENOUS | Status: DC | PRN
  Administered 2021-11-26: 400 mg via INTRAVENOUS

## 2021-11-26 MED ORDER — BUPIVACAINE HCL (PF) 0.5 % IJ SOLN
INTRAMUSCULAR | Status: DC | PRN
  Administered 2021-11-26: 15 mL via PERINEURAL

## 2021-11-26 MED ORDER — FENTANYL CITRATE (PF) 100 MCG/2ML IJ SOLN
INTRAMUSCULAR | Status: AC
  Filled 2021-11-26: qty 2

## 2021-11-26 MED ORDER — OXYCODONE HCL 5 MG PO TABS: 5.0000 mg | ORAL_TABLET | Freq: Once | ORAL | Status: DC | PRN

## 2021-11-26 MED ORDER — 0.9 % SODIUM CHLORIDE (POUR BTL) OPTIME
TOPICAL | Status: DC | PRN
  Administered 2021-11-26: 1000 mL

## 2021-11-26 MED ORDER — HYDROCODONE-ACETAMINOPHEN 5-325 MG PO TABS: 1.0000 | ORAL_TABLET | ORAL | 0 refills | Status: DC | PRN

## 2021-11-26 MED ORDER — ACETAMINOPHEN 500 MG PO TABS
1000.0000 mg | ORAL_TABLET | Freq: Once | ORAL | Status: AC
  Administered 2021-11-26: 1000 mg via ORAL
  Filled 2021-11-26: qty 2

## 2021-11-26 MED ORDER — PHENYLEPHRINE HCL-NACL 20-0.9 MG/250ML-% IV SOLN
INTRAVENOUS | Status: DC | PRN
  Administered 2021-11-26: 70 ug/min via INTRAVENOUS

## 2021-11-26 MED ORDER — PROPOFOL 10 MG/ML IV BOLUS
INTRAVENOUS | Status: DC | PRN
  Administered 2021-11-26: 200 mg via INTRAVENOUS

## 2021-11-26 MED ORDER — HYDROCODONE-ACETAMINOPHEN 5-325 MG PO TABS
ORAL_TABLET | ORAL | 0 refills | Status: DC
  Filled 2021-11-26: qty 20, 4d supply, fill #0

## 2021-11-26 MED ORDER — ONDANSETRON HCL 4 MG/2ML IJ SOLN
4.0000 mg | Freq: Once | INTRAMUSCULAR | Status: AC
  Administered 2021-11-26: 4 mg via INTRAVENOUS

## 2021-11-26 MED ORDER — LIDOCAINE 2% (20 MG/ML) 5 ML SYRINGE
INTRAMUSCULAR | Status: DC | PRN
  Administered 2021-11-26: 60 mg via INTRAVENOUS

## 2021-11-26 MED ORDER — ONDANSETRON HCL 4 MG/2ML IJ SOLN
INTRAMUSCULAR | Status: DC | PRN
  Administered 2021-11-26: 4 mg via INTRAVENOUS

## 2021-11-26 MED ORDER — OXYCODONE HCL 5 MG/5ML PO SOLN: 5.0000 mg | Freq: Once | ORAL | Status: DC | PRN

## 2021-11-26 MED ORDER — FENTANYL CITRATE (PF) 100 MCG/2ML IJ SOLN
INTRAMUSCULAR | Status: DC | PRN
  Administered 2021-11-26: 100 ug via INTRAVENOUS

## 2021-11-26 MED ORDER — EPHEDRINE SULFATE-NACL 50-0.9 MG/10ML-% IV SOSY
PREFILLED_SYRINGE | INTRAVENOUS | Status: DC | PRN
  Administered 2021-11-26 (×2): 5 mg via INTRAVENOUS

## 2021-11-26 MED ORDER — SODIUM CHLORIDE 0.9 % IR SOLN
Status: DC | PRN
  Administered 2021-11-26: 6000 mL

## 2021-11-26 MED ORDER — CEFAZOLIN IN SODIUM CHLORIDE 3-0.9 GM/100ML-% IV SOLN
3.0000 g | INTRAVENOUS | Status: AC
  Administered 2021-11-26: 3 g via INTRAVENOUS
  Filled 2021-11-26: qty 100

## 2021-11-26 MED ORDER — BUPIVACAINE LIPOSOME 1.3 % IJ SUSP
INTRAMUSCULAR | Status: DC | PRN
  Administered 2021-11-26: 10 mL

## 2021-11-26 MED ORDER — SCOPOLAMINE 1 MG/3DAYS TD PT72
1.0000 | MEDICATED_PATCH | TRANSDERMAL | Status: DC
  Administered 2021-11-26: 1.5 mg via TRANSDERMAL
  Filled 2021-11-26: qty 1

## 2021-11-26 MED ORDER — ONDANSETRON HCL 4 MG/2ML IJ SOLN
INTRAMUSCULAR | Status: AC
  Filled 2021-11-26: qty 2

## 2021-11-26 MED ORDER — PROMETHAZINE HCL 25 MG/ML IJ SOLN: 6.2500 mg | INTRAMUSCULAR | Status: DC | PRN

## 2021-11-26 MED ORDER — MIDAZOLAM HCL 2 MG/2ML IJ SOLN
1.0000 mg | Freq: Once | INTRAMUSCULAR | Status: AC
  Administered 2021-11-26: 2 mg via INTRAVENOUS
  Filled 2021-11-26: qty 2

## 2021-11-26 SURGICAL SUPPLY — 60 items
BAG COUNTER SPONGE SURGICOUNT (BAG) IMPLANT
BOOTIES KNEE HIGH SLOAN (MISCELLANEOUS) ×2 IMPLANT
BURR OVAL 8 FLU 4.0X13 (MISCELLANEOUS) ×1 IMPLANT
CANNULA 5.75X7 CRYSTAL CLEAR (CANNULA) ×1 IMPLANT
CANNULA TWIST IN 8.25X7CM (CANNULA) IMPLANT
COOLER ICEMAN CLASSIC (MISCELLANEOUS) IMPLANT
COVER SURGICAL LIGHT HANDLE (MISCELLANEOUS) ×1 IMPLANT
CUTTER BONE 4.0MM X 13CM (MISCELLANEOUS) ×1 IMPLANT
DISSECTOR  3.8MM X 13CM (MISCELLANEOUS)
DISSECTOR 3.8MM X 13CM (MISCELLANEOUS) IMPLANT
DRAPE IMP U-DRAPE 54X76 (DRAPES) ×1 IMPLANT
DRAPE INCISE IOBAN 66X45 STRL (DRAPES) IMPLANT
DRAPE ORTHO SPLIT 77X108 STRL (DRAPES) ×2
DRAPE STERI 35X30 U-POUCH (DRAPES) ×1 IMPLANT
DRAPE SURG ORHT 6 SPLT 77X108 (DRAPES) ×2 IMPLANT
DRAPE U-SHAPE 47X51 STRL (DRAPES) ×2 IMPLANT
DURAPREP 26ML APPLICATOR (WOUND CARE) ×1 IMPLANT
ELECT REM PT RETURN 15FT ADLT (MISCELLANEOUS) ×1 IMPLANT
GAUZE PAD ABD 8X10 STRL (GAUZE/BANDAGES/DRESSINGS) ×2 IMPLANT
GAUZE SPONGE 4X4 12PLY STRL (GAUZE/BANDAGES/DRESSINGS) ×1 IMPLANT
GAUZE XEROFORM 1X8 LF (GAUZE/BANDAGES/DRESSINGS) ×1 IMPLANT
GLOVE BIO SURGEON STRL SZ7.5 (GLOVE) ×1 IMPLANT
GLOVE BIOGEL PI IND STRL 6.5 (GLOVE) ×1 IMPLANT
GLOVE BIOGEL PI IND STRL 8 (GLOVE) ×1 IMPLANT
GLOVE SURG POLYISO LF SZ6.5 (GLOVE) ×1 IMPLANT
GOWN STRL REUS W/ TWL LRG LVL3 (GOWN DISPOSABLE) ×1 IMPLANT
GOWN STRL REUS W/ TWL XL LVL3 (GOWN DISPOSABLE) ×1 IMPLANT
GOWN STRL REUS W/TWL LRG LVL3 (GOWN DISPOSABLE) ×1
GOWN STRL REUS W/TWL XL LVL3 (GOWN DISPOSABLE) ×1
KIT BASIN OR (CUSTOM PROCEDURE TRAY) ×1 IMPLANT
KIT PUSHLOCK 2.9 HIP (KITS) IMPLANT
KIT TURNOVER KIT A (KITS) IMPLANT
LASSO 90 CVE QUICKPAS (DISPOSABLE) IMPLANT
LASSO CRESCENT QUICKPASS (SUTURE) IMPLANT
MANIFOLD NEPTUNE II (INSTRUMENTS) ×1 IMPLANT
NDL SCORPION MULTI FIRE (NEEDLE) IMPLANT
NEEDLE SCORPION MULTI FIRE (NEEDLE) IMPLANT
PACK ARTHROSCOPY WL (CUSTOM PROCEDURE TRAY) ×1 IMPLANT
PAD COLD SHLDR WRAP-ON (PAD) IMPLANT
PROBE BIPOLAR ATHRO 135MM 90D (MISCELLANEOUS) ×1 IMPLANT
PROTECTOR NERVE ULNAR (MISCELLANEOUS) ×1 IMPLANT
RESTRAINT HEAD UNIVERSAL NS (MISCELLANEOUS) IMPLANT
SLING ARM FOAM STRAP LRG (SOFTGOODS) IMPLANT
SLING ARM FOAM STRAP MED (SOFTGOODS) IMPLANT
SLING ARM IMMOBILIZER LRG (SOFTGOODS) IMPLANT
SLING ARM IMMOBILIZER MED (SOFTGOODS) IMPLANT
SUPPORT WRAP ARM LG (MISCELLANEOUS) ×1 IMPLANT
SUT ETHILON 3 0 PS 1 (SUTURE) ×1 IMPLANT
SUT PDS AB 1 CT1 27 (SUTURE) IMPLANT
SUT TIGER TAPE 7 IN WHITE (SUTURE) IMPLANT
SUTURE TAPE 1.3 40 TPR END (SUTURE) IMPLANT
SUTURETAPE 1.3 40 TPR END (SUTURE)
TAPE FIBER 2MM 7IN #2 BLUE (SUTURE) IMPLANT
TAPE LABRALWHITE 1.5X36 (TAPE) IMPLANT
TAPE SUT LABRALTAP WHT/BLK (SUTURE) IMPLANT
TOWEL OR 17X26 10 PK STRL BLUE (TOWEL DISPOSABLE) ×1 IMPLANT
TOWEL OR NON WOVEN STRL DISP B (DISPOSABLE) ×1 IMPLANT
TUBING ARTHROSCOPY IRRIG 16FT (MISCELLANEOUS) ×1 IMPLANT
TUBING CONNECTING 10 (TUBING) ×2 IMPLANT
WAND ABLATOR APOLLO I90 (BUR) IMPLANT

## 2021-11-26 NOTE — Anesthesia Procedure Notes (Signed)
Anesthesia Regional Block: Interscalene brachial plexus block   Pre-Anesthetic Checklist: , timeout performed,  Correct Patient, Correct Site, Correct Laterality,  Correct Procedure, Correct Position, site marked,  Risks and benefits discussed,  Surgical consent,  Pre-op evaluation,  At surgeon's request and post-op pain management  Laterality: Left  Prep: chloraprep       Needles:  Injection technique: Single-shot  Needle Type: Echogenic Needle     Needle Length: 5cm  Needle Gauge: 21     Additional Needles:   Narrative:  Start time: 11/26/2021 8:44 AM End time: 11/26/2021 8:47 AM Injection made incrementally with aspirations every 5 mL.  Performed by: Personally  Anesthesiologist: Audry Pili, MD  Additional Notes: No pain on injection. No increased resistance to injection. Injection made in 5cc increments. Good needle visualization. Patient tolerated the procedure well.

## 2021-11-26 NOTE — H&P (Signed)
April Graham is an 41 y.o. female.   Chief Complaint: Left shoulder pain HPI: Status post injury at work with ongoing left shoulder pain with AC joint related pain and impingement pain, failed extensive conservative management.  Past Medical History:  Diagnosis Date   Anxiety    Arthritis    Family history of adverse reaction to anesthesia    Mother N&V   Irritable bowel syndrome (IBS)    Palpitations    PONV (postoperative nausea and vomiting)     Past Surgical History:  Procedure Laterality Date   ADENOIDECTOMY     KNEE ARTHROSCOPY     KNEE ARTHROSCOPY W/ ACL RECONSTRUCTION     TONSILLECTOMY     WISDOM TOOTH EXTRACTION      Family History  Problem Relation Age of Onset   Arthritis Mother    Depression Mother    Diabetes Mother    High Cholesterol Mother    Transient ischemic attack Mother    Arthritis Father    Cancer Father    COPD Father    Diabetes Father    Hypertension Father    Cancer Maternal Grandfather    Cancer Paternal Grandmother    Social History:  reports that she has never smoked. She has never used smokeless tobacco. She reports current alcohol use. She reports that she does not use drugs.  Allergies: No Known Allergies  Medications Prior to Admission  Medication Sig Dispense Refill   ibuprofen (ADVIL) 200 MG tablet Take 800 mg by mouth every 6 (six) hours as needed for mild pain or moderate pain.     LORazepam (ATIVAN) 1 MG tablet Take 0.5 tablets (0.5 mg total) by mouth as needed. May take twice daily for occasional anxiety 30 tablet 0   Norgestimate-Ethinyl Estradiol Triphasic (TRI-ESTARYLLA) 0.18/0.215/0.25 MG-35 MCG tablet Take 1 tablet by mouth daily. 28 tablet 7   propranolol (INDERAL) 10 MG tablet TAKE 1 TABLET (10 MG TOTAL) BY MOUTH 3 (THREE) TIMES DAILY. USE AS NEEDED FOR TACHYCARDIA (Patient taking differently: Take 10 mg by mouth at bedtime. Use as needed for tachycardia) 60 tablet 1   dexamethasone (DECADRON) 4 MG tablet Take one  tablet by mouth twice a day with food (Patient not taking: Reported on 11/16/2021) 12 tablet 0   meloxicam (MOBIC) 15 MG tablet Take one tablet by mouth daily with food (Patient not taking: Reported on 11/16/2021) 7 tablet 0   MOUNJARO 2.5 MG/0.5ML Pen INJECT 2.5 MG SUBCUTANEOUSLY WEEKLY (Patient not taking: Reported on 11/16/2021) 1 mL 2    Results for orders placed or performed during the hospital encounter of 11/26/21 (from the past 48 hour(s))  Pregnancy, urine POC     Status: None   Collection Time: 11/26/21  7:48 AM  Result Value Ref Range   Preg Test, Ur NEGATIVE NEGATIVE    Comment:        THE SENSITIVITY OF THIS METHODOLOGY IS >24 mIU/mL    No results found.  Review of Systems  All other systems reviewed and are negative.   Blood pressure 133/88, pulse 86, resp. rate 11, last menstrual period 10/28/2021, SpO2 98 %. Physical Exam HENT:     Head: Atraumatic.  Eyes:     Extraocular Movements: Extraocular movements intact.  Pulmonary:     Effort: Pulmonary effort is normal.  Musculoskeletal:     Comments: Left shoulder pain with impingement testing, tenderness to palpation over the [facility] joint  Skin:    General: Skin is warm and dry.  Neurological:     Mental Status: She is alert.  Psychiatric:        Mood and Affect: Mood normal.      Assessment/Plan Status post injury at work with ongoing left shoulder pain with [facility] joint related pain and impingement pain, failed extensive conservative management. Plan left shoulder arthroscopic subacromial decompression, distal clavicle excision Risks / benefits of surgery discussed Consent on chart  NPO for OR Preop antibiotics   Glennon Hamilton, MD 11/26/2021, 8:51 AM

## 2021-11-26 NOTE — Discharge Instructions (Addendum)

## 2021-11-26 NOTE — Anesthesia Procedure Notes (Signed)
Procedure Name: Intubation Date/Time: 11/26/2021 10:08 AM  Performed by: Lavina Hamman, CRNAPre-anesthesia Checklist: Patient identified, Emergency Drugs available, Suction available, Patient being monitored and Timeout performed Patient Re-evaluated:Patient Re-evaluated prior to induction Oxygen Delivery Method: Circle system utilized Preoxygenation: Pre-oxygenation with 100% oxygen Induction Type: IV induction Ventilation: Mask ventilation without difficulty Laryngoscope Size: Mac and 4 Grade View: Grade I Tube type: Oral Tube size: 7.0 mm Number of attempts: 1 Airway Equipment and Method: Stylet Placement Confirmation: ETT inserted through vocal cords under direct vision, positive ETCO2, CO2 detector and breath sounds checked- equal and bilateral Secured at: 22 cm Tube secured with: Tape Dental Injury: Teeth and Oropharynx as per pre-operative assessment  Comments: ATOI.

## 2021-11-26 NOTE — Transfer of Care (Signed)
Immediate Anesthesia Transfer of Care Note  Patient: April Graham  Procedure(s) Performed: Procedure(s): SHOULDER ARTHROSCOPY WITH SUBACROMIAL DECOMPRESSION AND DISTAL CLAVICLE EXCISION (Left)  Patient Location: PACU  Anesthesia Type:GA combined with regional for post-op pain  Level of Consciousness:  sedated, patient cooperative and responds to stimulation  Airway & Oxygen Therapy:Patient Spontanous Breathing and Patient connected to face mask oxgen  Post-op Assessment:  Report given to PACU RN and Post -op Vital signs reviewed and stable  Post vital signs:  Reviewed and stable  Last Vitals:  Vitals:   11/26/21 0845 11/26/21 0850  BP: (!) 142/93 133/88  Pulse: 86 86  Resp: 12 11  SpO2: 349% 17%    Complications: No apparent anesthesia complications

## 2021-11-26 NOTE — Op Note (Signed)
Procedure(s): SHOULDER ARTHROSCOPY WITH SUBACROMIAL DECOMPRESSION AND DISTAL CLAVICLE EXCISION Procedure Note  April Graham female 41 y.o. 11/26/2021   DIAGNOSES: Left shoulder, AC arthritis and subacromial impingement.  POST-OPERATIVE DIAGNOSIS: same and partial thickness supraspinatus tear  PROCEDURE: L shoulder Arthroscopic extensive debridement - 29823 Subdeltoid Bursa, Supraspinatus Tendon, and Superior Labrum Arthroscopic distal clavicle excision - 09323 Arthroscopic subacromial decompression - 55732   OPERATIVE FINDING: Exam under anesthesia: Normal Articular space: Normal Chondral surfaces: Normal Biceps: Normal Subscapularis: Intact  Supraspinatus: Incomplete tear 10% articular sided Infraspinatus: Intact      Surgeon(s) and Role:    Tania Ade, MD - Primary     Surgeon: Rhae Hammock   Assistants: none  Anesthesia: General endotracheal anesthesia with preoperative interscalene block given by April attending anesthesiologist   Procedure Detail  SHOULDER ARTHROSCOPY WITH SUBACROMIAL DECOMPRESSION AND DISTAL CLAVICLE EXCISION  Estimated Blood Loss: Min         Drains: none  Blood Given: none         Specimens: none        Complications:  * No complications entered in OR log *         Disposition: PACU - hemodynamically stable.         Condition: stable    Procedure:   INDICATIONS FOR SURGERY: April Graham is 41 y.o. female who has had left shoulder pain since an injury at work.  She responded well initially to a AC joint injection but April relief was only temporary.  She also had signs of impingement with rotator cuff related pain.  OPERATIVE FINDINGS: Examination under anesthesia: No stiffness or instability   DESCRIPTION OF PROCEDURE: April Graham was identified in preoperative  holding area where I personally marked April operative site after  verifying site, side, and procedure with April Graham. An interscalene block was given by  April attending anesthesiologist April holding area.  April Graham was taken back to April operating room where general anesthesia was induced without complication and was placed in April beach-chair position with April back  elevated about 60 degrees and all extremities and head and neck carefully padded and  positioned.   April left upper extremity was then prepped and  draped in a standard sterile fashion. April appropriate time-out  procedure was carried out. April Graham did receive IV antibiotics  within 30 minutes of incision.   A small posterior portal incision was made and April arthroscope was introduced into April joint. An anterior portal was then established above April subscapularis using needle localization. Small cannula was placed anteriorly. Diagnostic arthroscopy was then carried out with findings as described above.  April joint surfaces look good with no chondromalacia.  She had mild fraying of April superior labrum which was debrided.  April biceps tendon looked good.  April subscapularis was completely intact.  April undersurface of April supraspinatus was noted anteriorly to have about a 10% articular sided partial tear with a loose flap.  This was debrided back to healthy bleeding tendon.  April arthroscope was then introduced into April subacromial space a standard lateral portal was established with needle localization. April shaver was used through April lateral portal to perform extensive bursectomy.   April bursal surface of April rotator cuff was completely intact.  April coracoacromial ligament was taken down off April anterior acromion with April ArthroCare exposing a moderate anterior acromial spur. A high-speed bur was then used through April lateral portal to take down April anterior acromial spur from lateral to  medial in a standard acromioplasty.  April acromioplasty was also viewed from April lateral portal and April bur was used as necessary to ensure that April acromion was completely flat from posterior to  anterior.  April distal clavicle was exposed arthroscopically and April bur was used to t resect about 10 mm of April distal clavicle. This was viewed from anterior and lateral portals and felt to be complete.  April arthroscopic equipment was removed from April joint and April portals were closed with 3-0 nylon in an interrupted fashion. Sterile dressings were then applied including Xeroform 4 x 4's ABDs and tape. April Graham was then allowed to awaken from general anesthesia, placed in a sling, transferred to April stretcher and taken to April recovery room in stable condition.   POSTOPERATIVE PLAN: April Graham will be discharged home today and will followup in one week for suture removal and wound check.

## 2021-11-26 NOTE — Anesthesia Preprocedure Evaluation (Addendum)
Anesthesia Evaluation  Patient identified by MRN, date of birth, ID band Patient awake    Reviewed: Allergy & Precautions, NPO status , Patient's Chart, lab work & pertinent test results  History of Anesthesia Complications (+) PONV and history of anesthetic complications  Airway Mallampati: I  TM Distance: >3 FB Neck ROM: Full    Dental  (+) Dental Advisory Given, Teeth Intact   Pulmonary neg pulmonary ROS,    Pulmonary exam normal        Cardiovascular negative cardio ROS Normal cardiovascular exam     Neuro/Psych PSYCHIATRIC DISORDERS Anxiety negative neurological ROS     GI/Hepatic Neg liver ROS,  IBS    Endo/Other   Obesity   Renal/GU negative Renal ROS     Musculoskeletal  (+) Arthritis ,   Abdominal   Peds  Hematology negative hematology ROS (+)   Anesthesia Other Findings   Reproductive/Obstetrics                            Anesthesia Physical Anesthesia Plan  ASA: 2  Anesthesia Plan: General   Post-op Pain Management: Tylenol PO (pre-op)* and Regional block*   Induction: Intravenous  PONV Risk Score and Plan: 4 or greater and Treatment may vary due to age or medical condition, Ondansetron, Dexamethasone, Midazolam and Scopolamine patch - Pre-op  Airway Management Planned: Oral ETT  Additional Equipment: None  Intra-op Plan:   Post-operative Plan: Extubation in OR  Informed Consent: I have reviewed the patients History and Physical, chart, labs and discussed the procedure including the risks, benefits and alternatives for the proposed anesthesia with the patient or authorized representative who has indicated his/her understanding and acceptance.     Dental advisory given  Plan Discussed with: CRNA and Anesthesiologist  Anesthesia Plan Comments:        Anesthesia Quick Evaluation

## 2021-11-30 NOTE — Anesthesia Postprocedure Evaluation (Signed)
Anesthesia Post Note  Patient: April Graham  Procedure(s) Performed: SHOULDER ARTHROSCOPY WITH SUBACROMIAL DECOMPRESSION AND DISTAL CLAVICLE EXCISION (Left: Shoulder)     Patient location during evaluation: PACU Anesthesia Type: General Level of consciousness: awake and alert Pain management: pain level controlled Vital Signs Assessment: post-procedure vital signs reviewed and stable Respiratory status: spontaneous breathing, nonlabored ventilation and respiratory function stable Cardiovascular status: blood pressure returned to baseline and stable Postop Assessment: no apparent nausea or vomiting Anesthetic complications: no   No notable events documented.  Last Vitals:  Vitals:   11/26/21 1130 11/26/21 1145  BP: 125/77 131/85  Pulse: 88 92  Resp: 18 16  Temp:  (!) 36.1 C  SpO2: 93% 96%    Last Pain:  Vitals:   11/26/21 1145  PainSc: 0-No pain                 Audry Pili

## 2021-12-02 ENCOUNTER — Other Ambulatory Visit: Payer: Self-pay | Admitting: Family Medicine

## 2021-12-02 ENCOUNTER — Encounter (HOSPITAL_COMMUNITY): Payer: Self-pay | Admitting: Orthopedic Surgery

## 2021-12-02 ENCOUNTER — Encounter: Payer: Self-pay | Admitting: Family Medicine

## 2021-12-02 DIAGNOSIS — Z3041 Encounter for surveillance of contraceptive pills: Secondary | ICD-10-CM

## 2021-12-02 MED ORDER — NORGESTIM-ETH ESTRAD TRIPHASIC 0.18/0.215/0.25 MG-35 MCG PO TABS: 1.0000 | ORAL_TABLET | Freq: Every day | ORAL | 3 refills | Status: DC

## 2021-12-04 ENCOUNTER — Encounter: Payer: Self-pay | Admitting: Physical Therapy

## 2021-12-04 ENCOUNTER — Ambulatory Visit: Payer: PRIVATE HEALTH INSURANCE | Attending: Orthopedic Surgery | Admitting: Physical Therapy

## 2021-12-04 DIAGNOSIS — M25512 Pain in left shoulder: Secondary | ICD-10-CM | POA: Diagnosis present

## 2021-12-04 DIAGNOSIS — M25612 Stiffness of left shoulder, not elsewhere classified: Secondary | ICD-10-CM | POA: Insufficient documentation

## 2021-12-04 NOTE — Addendum Note (Signed)
Addended by: Rivka Barbara B on: 12/04/2021 11:23 AM   Modules accepted: Orders

## 2021-12-04 NOTE — Therapy (Signed)
OUTPATIENT PHYSICAL THERAPY SHOULDER EVALUATION   Patient Name: April Graham MRN: [id number] DOB:15-Oct-1980, 41 y.o., female Today's Date: 12/04/2021   PT End of Session - 12/04/21 1022     Visit Number 1    Number of Visits 11    Date for PT Re-Evaluation 01/29/22    Authorization Type WC    Authorization Time Period 10/6-11/13 (10 treats, one eval in this time frame)    Authorization - Visit Number 1    Authorization - Number of Visits 11    Progress Note Due on Visit 10    PT Start Time 1019    PT Stop Time 1059    PT Time Calculation (min) 40 min    Activity Tolerance Patient tolerated treatment well;Patient limited by pain    Behavior During Therapy WFL for tasks assessed/performed             Past Medical History:  Diagnosis Date   Anxiety    Arthritis    Family history of adverse reaction to anesthesia    Mother N&V   Irritable bowel syndrome (IBS)    Palpitations    PONV (postoperative nausea and vomiting)    Past Surgical History:  Procedure Laterality Date   ADENOIDECTOMY     KNEE ARTHROSCOPY     KNEE ARTHROSCOPY W/ ACL RECONSTRUCTION     TONSILLECTOMY     WISDOM TOOTH EXTRACTION     There are no problems to display for this patient.   PCP: Copland, Gay Filler, MD  REFERRING PROVIDER: Tania Ade, MD  REFERRING DIAG: s/p L shoulder arthroscopic debridement, SAD, DCE (distal clavicle excision)  THERAPY DIAG:  Stiffness of left shoulder, not elsewhere classified  Acute pain of left shoulder  Rationale for Evaluation and Treatment Rehabilitation  ONSET DATE: 11/27/21  SUBJECTIVE:                                                                                                                                                                                      SUBJECTIVE STATEMENT: Pt reports she continued to have pain in her L shoulder and eventually got to the point where she could not complete any activities without pain. Pt had surgery  and is ready to get back to her previous activities. Pt reports her surgeon showed hr her RTC was partially torn and her labrum was partially frayed.   PERTINENT HISTORY: he patient is 41 y.o. female who has had left shoulder pain since an injury at work.  She responded well initially to a AC joint injection but the relief was only temporary.  She also had signs of impingement with rotator cuff related pain.  Patient had surgical procedure for shoulder arthroscope with subacromial decompression as well as distal clavicular excision.   PAIN:  Are you having pain? Yes: NPRS scale: 2-3 that increases to 7 with passive or active movement/10 Pain location: L shoulder  Pain description: sharp at times but constant ache Aggravating factors: movement Relieving factors: ice, norco before sleep at night, ibuprofin every 6 hours or so    PRECAUTIONS: None and Other: Goes back to work the 16th with light desk duty until she is seen for follow up Nov 3 Dr. Has also given physical therapy for him to progress patient as we see appropriate with upper extremity strengthening.  WEIGHT BEARING RESTRICTIONS No  FALLS:  Has patient fallen in last 6 months? No  LIVING ENVIRONMENT: Lives with: lives with their family, lives with their partner, and boyfriend's kids live with her part time  Lives in: House/apartment Stairs: Yes: Internal: 11 steps; on right going up and External: 1 steps; none Has following equipment at home: None  OCCUPATION: Nurse in ED  PLOF: Independent  PATIENT GOALS Full functional activities for work ( pushing, pulling, lifting  OBJECTIVE:   DIAGNOSTIC FINDINGS:  From surgical note 9/28:  PROCEDURE: L shoulder Arthroscopic extensive debridement -  Supraspinatus Tendon, and Superior Labrum Arthroscopic distal clavicle excision - Arthroscopic subacromial decompression    OPERATIVE FINDING: Exam under anesthesia: Normal Articular space: Normal Chondral surfaces:  Normal Biceps: Normal Subscapularis: Intact  Supraspinatus: Incomplete tear 10% articular sided Infraspinatus: Intact    The patient is 41 y.o. female who has had left shoulder pain since an injury at work.  She responded well initially to a AC joint injection but the relief was only temporary.  She also had signs of impingement with rotator cuff related pain.  PATIENT SURVEYS:  FOTO 46  COGNITION:  Overall cognitive status: Within functional limits for tasks assessed     SENSATION: WFL  POSTURE:  slight rounded shoulder posture  UPPER EXTREMITY ROM:   Passive ROM Right eval Left eval  Shoulder flexion WNL 143  Shoulder extension    Shoulder abduction WNL 90  Shoulder adduction    Shoulder internal rotation @ 90 ABD WNL 65  Shoulder external rotation at 20 degrees of GH ABD  WNL 45  Elbow flexion    Elbow extension    Wrist flexion    Wrist extension    Wrist ulnar deviation    Wrist radial deviation    Wrist pronation    Wrist supination    (Blank rows = not tested)  UPPER EXTREMITY MMT: Not assessed at eval secondary to recency of surgical procedure and pain with passive movements  MMT Right eval Left eval  Shoulder flexion    Shoulder extension    Shoulder abduction    Shoulder adduction    Shoulder internal rotation    Shoulder external rotation    Middle trapezius    Lower trapezius    Elbow flexion    Elbow extension    Wrist flexion    Wrist extension    Wrist ulnar deviation    Wrist radial deviation    Wrist pronation    Wrist supination    Grip strength (lbs)    (Blank rows = not tested)  SHOULDER SPECIAL TESTS:  N/A, post op   JOINT MOBILITY TESTING:  Not tested  PALPATION:  Tenderness to palpation in left shoulder generally.    TODAY'S TREATMENT:  Access Code: [email or web address] URL: [email or web address] Date: 12/04/2021 Prepared by: Rivka Barbara  Exercises - Circular Shoulder Pendulum with Table Support  - 2-3 x  daily - 7 x weekly - 20 reps - Flexion-Extension Shoulder Pendulum with Table Support  - 2-3 x daily - 7 x weekly - 1 sets - 20 reps - Horizontal Shoulder Pendulum with Table Support  - 2-3 x daily - 7 x weekly - 20 reps - Seated Shoulder Abduction Towel Slide at Table Top  - 2-3 x daily - 7 x weekly - 1 sets - 15 reps - 5 second hold   PATIENT EDUCATION: Education details: POC Person educated: Patient Education method: Explanation Education comprehension: verbalized understanding   HOME EXERCISE PROGRAM: Access Code: [email or web address] URL: [email or web address] Date: 12/04/2021 Prepared by: Rivka Barbara  Exercises - Circular Shoulder Pendulum with Table Support  - 2-3 x daily - 7 x weekly - 20 reps - Flexion-Extension Shoulder Pendulum with Table Support  - 2-3 x daily - 7 x weekly - 1 sets - 20 reps - Horizontal Shoulder Pendulum with Table Support  - 2-3 x daily - 7 x weekly - 20 reps - Seated Shoulder Abduction Towel Slide at Table Top  - 2-3 x daily - 7 x weekly - 1 sets - 15 reps - 5 second hold  ASSESSMENT:  CLINICAL IMPRESSION: Patient is a 41 y.o. female who was seen today for physical therapy evaluation and treatment for following L shoulder arthroscopic debridement, SAD, DCE (distal clavicle excision).  Patient presents with pain in her left upper extremity with increased pain with active and active assisted movements.  Patient does report significant improvement since surgery and her overall pain levels.  Patient also presents with decreased active and passive range of motion of the left shoulder as demonstrated in range of motion values in this exam.  Strength not formally assessed today secondary to recency of surgery and pain.  Patient will benefit from skilled physical therapy to improve her left shoulder range of motion, pain, strength, and improve her overall function to allow her to safely return to work and other activities.   OBJECTIVE IMPAIRMENTS  decreased ROM, decreased strength, impaired UE functional use, and pain.   ACTIVITY LIMITATIONS carrying, lifting, bed mobility, dressing, self feeding, and reach over head  PARTICIPATION LIMITATIONS: meal prep, cleaning, laundry, driving, shopping, community activity, occupation, and yard work  PERSONAL FACTORS Profession are also affecting patient's functional outcome.   REHAB POTENTIAL: Good  CLINICAL DECISION MAKING: Stable/uncomplicated  EVALUATION COMPLEXITY: Low   GOALS: Goals reviewed with patient? Yes  SHORT TERM GOALS: Target date: 01/01/2022  (Remove Blue Hyperlink)     Patient will be independent in home exercise program to improve strength/mobility for better functional independence with ADLs. Baseline: No HEP currently  Goal status: INITIAL   LONG TERM GOALS: Target date: 01/29/2022  (Remove Blue Hyperlink)  Pt will report resting pain levels 1/10 or less in order to indicate improvement in subjective levels of pain. Baseline: 3/10 resting, 6-7/10 with movement  Goal status: INITIAL  2.  Patient will improve Foto score to 61 or greater in order to indicate improved subjective rating of upper extremity functional use Baseline: 46 Goal status: INITIAL  3.  Patient will improve left shoulder active range of motion to 160 degrees flexion, and abduction, and 80 degrees of external and internal rotation in abducted shoulder position in order to improve patient's ability to perform functional activities. Baseline: See evaluation range of motion chart Goal status: INITIAL  4.  Patient will improve left shoulder strength in all major  movements planes including abduction, scaption, flexion, external and internal rotation to 4+ out of 5 or greater in order to indicate improved shoulder strength for functional activities Baseline: Not assessed that he fell secondary to recency of surgical procedure Goal status: INITIAL  5.  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 Baseline: Unable to perform at this time without pain Goal status: INITIAL   PLAN: PT FREQUENCY: 1-2x/week  PT DURATION: 8 weeks  PLANNED INTERVENTIONS: Therapeutic exercises, Therapeutic activity, Neuromuscular re-education, Balance training, Gait training, Patient/Family education, Self Care, Joint mobilization, and Manual therapy  PLAN FOR NEXT SESSION: Manual as needed, PROM, gentle theex as indicated   Particia Lather, PT 12/04/2021, 10:25 AM

## 2021-12-07 ENCOUNTER — Ambulatory Visit: Payer: PRIVATE HEALTH INSURANCE | Attending: Orthopedic Surgery | Admitting: Physical Therapy

## 2021-12-07 DIAGNOSIS — M25612 Stiffness of left shoulder, not elsewhere classified: Secondary | ICD-10-CM | POA: Diagnosis present

## 2021-12-07 DIAGNOSIS — M25512 Pain in left shoulder: Secondary | ICD-10-CM | POA: Diagnosis present

## 2021-12-07 NOTE — Therapy (Signed)
OUTPATIENT PHYSICAL THERAPY SHOULDER TREATMENT   Patient Name: April Graham MRN: [id number] DOB:[birth year], 41 y.o., female Today's Date: 12/07/2021   PT End of Session - 12/07/21 0940     Visit Number 2    Number of Visits 11    Date for PT Re-Evaluation 01/29/22    Authorization Type WC    Authorization Time Period 10/6-11/13 (10 treats, one eval in this time frame)    Authorization - Visit Number 2    Authorization - Number of Visits 11    Progress Note Due on Visit 10    PT Start Time [phone]    PT Stop Time 1015    PT Time Calculation (min) 37 min    Activity Tolerance Patient tolerated treatment well;Patient limited by pain    Behavior During Therapy WFL for tasks assessed/performed             Past Medical History:  Diagnosis Date   Anxiety    Arthritis    Family history of adverse reaction to anesthesia    Mother N&V   Irritable bowel syndrome (IBS)    Palpitations    PONV (postoperative nausea and vomiting)    Past Surgical History:  Procedure Laterality Date   ADENOIDECTOMY     KNEE ARTHROSCOPY     KNEE ARTHROSCOPY W/ ACL RECONSTRUCTION     TONSILLECTOMY     WISDOM TOOTH EXTRACTION     There are no problems to display for this patient.   PCP: Copland, Gwenlyn Found, MD  REFERRING PROVIDER: Jones Broom, MD  REFERRING DIAG: s/p L shoulder arthroscopic debridement, SAD, DCE (distal clavicle excision)  THERAPY DIAG:  Stiffness of left shoulder, not elsewhere classified  Acute pain of left shoulder  Rationale for Evaluation and Treatment Rehabilitation  ONSET DATE: 11/27/21  SUBJECTIVE:                                                                                                                                                                                      SUBJECTIVE STATEMENT: Pt reports some pain in her shoulder this AM. Reports Hep has been going well.   PERTINENT HISTORY: He patient is 41 y.o. female who has had left shoulder  pain since an injury at work.  She responded well initially to a AC joint injection but the relief was only temporary. She also had signs of impingement with rotator cuff related pain. Patient had surgical procedure for shoulder arthroscope with subacromial decompression as well as distal clavicular excision.   PAIN:  Are you having pain? Yes: NPRS scale: 5/10 Pain location: L shoulder  Pain description: sharp at times but constant ache Aggravating  factors: movement Relieving factors: ice, norco before sleep at night, ibuprofin every 6 hours or so    PRECAUTIONS: None and Other: Goes back to work the 16th with light desk duty until she is seen for follow up Nov 3 Dr. Has also given physical therapy for him to progress patient as we see appropriate with upper extremity strengthening.  WEIGHT BEARING RESTRICTIONS No  FALLS:  Has patient fallen in last 6 months? No  LIVING ENVIRONMENT: Lives with: lives with their family, lives with their partner, and boyfriend's kids live with her part time  Lives in: House/apartment Stairs: Yes: Internal: 11 steps; on right going up and External: 1 steps; none Has following equipment at home: None  OCCUPATION: Nurse in ED  PLOF: Independent  PATIENT GOALS Full functional activities for work ( pushing, pulling, lifting  OBJECTIVE:   DIAGNOSTIC FINDINGS:  From surgical note 9/28:  PROCEDURE: L shoulder Arthroscopic extensive debridement -  Supraspinatus Tendon, and Superior Labrum Arthroscopic distal clavicle excision - Arthroscopic subacromial decompression    OPERATIVE FINDING: Exam under anesthesia: Normal Articular space: Normal Chondral surfaces: Normal Biceps: Normal Subscapularis: Intact  Supraspinatus: Incomplete tear 10% articular sided Infraspinatus: Intact    The patient is 41 y.o. female who has had left shoulder pain since an injury at work.  She responded well initially to a AC joint injection but the relief was only  temporary.  She also had signs of impingement with rotator cuff related pain.  PATIENT SURVEYS:  FOTO 46  COGNITION:  Overall cognitive status: Within functional limits for tasks assessed     SENSATION: WFL  POSTURE:  slight rounded shoulder posture  UPPER EXTREMITY ROM:   Passive ROM Right eval Left eval  Shoulder flexion WNL 143  Shoulder extension    Shoulder abduction WNL 90  Shoulder adduction    Shoulder internal rotation @ 90 ABD WNL 65  Shoulder external rotation at 20 degrees of GH ABD  WNL 45  Elbow flexion    Elbow extension    Wrist flexion    Wrist extension    Wrist ulnar deviation    Wrist radial deviation    Wrist pronation    Wrist supination    (Blank rows = not tested)  UPPER EXTREMITY MMT: Not assessed at eval secondary to recency of surgical procedure and pain with passive movements  MMT Right eval Left eval  Shoulder flexion    Shoulder extension    Shoulder abduction    Shoulder adduction    Shoulder internal rotation    Shoulder external rotation    Middle trapezius    Lower trapezius    Elbow flexion    Elbow extension    Wrist flexion    Wrist extension    Wrist ulnar deviation    Wrist radial deviation    Wrist pronation    Wrist supination    Grip strength (lbs)    (Blank rows = not tested)  SHOULDER SPECIAL TESTS:  N/A, post op   JOINT MOBILITY TESTING:  Not tested  PALPATION:  Tenderness to palpation in left shoulder generally.    TODAY'S TREATMENT:  Manual therapy Towel roll placed under her left upper extremity to place left shoulder out of glenohumeral extension for patient comfort Physical therapist completes passive range of motion with 5 to 10-second holds in all major planes of motion including flexion, scaption, abduction, and internal and external rotation at 45 degrees of abduction.  Patient initially had some discomfort with flexion but  following some interventions below range of motion did improve with  less pain. -Gentle shoulder distraction and 45 degrees flexion and 45 degrees abduction x1 to 2 minutes each Gentle shoulder anterior to posterior mopes grade 1 to 2 x 1 to 2 minutes Left upper trap stretch with glenohumeral depression 2 x 45 seconds  Therapeutic exercise Supine Supine isometric exercises utilizing physical therapists hand for resistance with left shoulder and 45 degrees abduction completes internal and external rotation at approximately 25% strength with no complaints of pain for 2 sets of 10 repetitions.  Following this patient completes glenohumeral extension into towel roll for 2 sets of 10 at approximately 50% of strength Scapular squeezes 10 times for 5-second holds.  No pain with movement but some discomfort in supine position without towel roll under left upper extremity Seated shoulder flexion table slide 10 times for 5-second holds, patient instructed to replace this with her active assistive range of motion exercise from Dr. Due to this exercise causing increase in pain and discomfort Seated left upper trapezius stretch with left upper extremity holding onto chair with right upper extremity assistance for stretch intensity 1 x 45 seconds.  Instruction to complete this she is having pain discomfort or tightness in her upper traps   PATIENT EDUCATION: Education details: POC Person educated: Patient Education method: Explanation Education comprehension: verbalized understanding   HOME EXERCISE PROGRAM: Access Code: [email or web address] URL: [email or web address] Date: 12/04/2021 Prepared by: Thresa Ross  Exercises - Circular Shoulder Pendulum with Table Support  - 2-3 x daily - 7 x weekly - 20 reps - Flexion-Extension Shoulder Pendulum with Table Support  - 2-3 x daily - 7 x weekly - 1 sets - 20 reps - Horizontal Shoulder Pendulum with Table Support  - 2-3 x daily - 7 x weekly - 20 reps - Seated Shoulder Abduction Towel Slide at Table Top  - 2-3 x daily -  7 x weekly - 1 sets - 15 reps - 5 second hold  ASSESSMENT:  CLINICAL IMPRESSION: Continued with current plan of care as laid out in evaluation. Pt remains motivated to advance progress toward goals in order to maximize independence and safety at home.  Began passive range of motion in left shoulder stretching to restore her full range of motion.  Patient also began with gentle isometrics but only with physical therapist at this time.  If patient's pain levels continue to improve may consider adding this to home exercise program within the next couple of sessions.  Patient responded well to manual therapy and reports improvement in shoulder pain and stiffness following therapy.  Pt continues to demonstrate progress toward goals AEB progression of interventions this date either in volume or intensity.    OBJECTIVE IMPAIRMENTS decreased ROM, decreased strength, impaired UE functional use, and pain.   ACTIVITY LIMITATIONS carrying, lifting, bed mobility, dressing, self feeding, and reach over head  PARTICIPATION LIMITATIONS: meal prep, cleaning, laundry, driving, shopping, community activity, occupation, and yard work  PERSONAL FACTORS Profession are also affecting patient's functional outcome.   REHAB POTENTIAL: Good  CLINICAL DECISION MAKING: Stable/uncomplicated  EVALUATION COMPLEXITY: Low   GOALS: Goals reviewed with patient? Yes  SHORT TERM GOALS: Target date: 01/04/2022  (Remove Blue Hyperlink)     Patient will be independent in home exercise program to improve strength/mobility for better functional independence with ADLs. Baseline: No HEP currently  Goal status: INITIAL   LONG TERM GOALS: Target date: 02/01/2022  (Remove Blue Hyperlink)  Pt will report resting pain levels 1/10 or  less in order to indicate improvement in subjective levels of pain. Baseline: 3/10 resting, 6-7/10 with movement  Goal status: INITIAL  2.  Patient will improve Foto score to 61 or greater in  order to indicate improved subjective rating of upper extremity functional use Baseline: 46 Goal status: INITIAL  3.  Patient will improve left shoulder active range of motion to 160 degrees flexion, and abduction, and 80 degrees of external and internal rotation in abducted shoulder position in order to improve patient's ability to perform functional activities. Baseline: See evaluation range of motion chart Goal status: INITIAL  4.  Patient will improve left shoulder strength in all major movements planes including abduction, scaption, flexion, external and internal rotation to 4+ out of 5 or greater in order to indicate improved shoulder strength for functional activities Baseline: Not assessed that he fell secondary to recency of surgical procedure Goal status: INITIAL  5.  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 Baseline: Unable to perform at this time without pain Goal status: INITIAL   PLAN: PT FREQUENCY: 1-2x/week  PT DURATION: 8 weeks  PLANNED INTERVENTIONS: Therapeutic exercises, Therapeutic activity, Neuromuscular re-education, Balance training, Gait training, Patient/Family education, Self Care, Joint mobilization, and Manual therapy  PLAN FOR NEXT SESSION: Manual as needed, PROM, gentle theex as indicated   Particia Lather, PT 12/07/2021, 11:19 AM

## 2021-12-10 ENCOUNTER — Encounter: Payer: Self-pay | Admitting: Physical Therapy

## 2021-12-10 ENCOUNTER — Ambulatory Visit: Payer: PRIVATE HEALTH INSURANCE | Attending: Orthopedic Surgery | Admitting: Physical Therapy

## 2021-12-10 DIAGNOSIS — M25612 Stiffness of left shoulder, not elsewhere classified: Secondary | ICD-10-CM | POA: Insufficient documentation

## 2021-12-10 DIAGNOSIS — M25512 Pain in left shoulder: Secondary | ICD-10-CM | POA: Insufficient documentation

## 2021-12-10 NOTE — Therapy (Signed)
OUTPATIENT PHYSICAL THERAPY SHOULDER TREATMENT   Patient Name: April Graham MRN: [id number] DOB:Oct 23, 1980, 41 y.o., female Today's Date: 12/10/2021   PT End of Session - 12/10/21 1005     Visit Number 3    Number of Visits 11    Date for PT Re-Evaluation 01/29/22    Authorization Type WC    Authorization Time Period WC: 10/6-11/13 (10 treats, one eval in this time frame)    Authorization - Visit Number 3    Authorization - Number of Visits 11    Progress Note Due on Visit 10    PT Start Time 1010    PT Stop Time 1054    PT Time Calculation (min) 44 min    Activity Tolerance Patient tolerated treatment well;Patient limited by pain    Behavior During Therapy WFL for tasks assessed/performed              Past Medical History:  Diagnosis Date   Anxiety    Arthritis    Family history of adverse reaction to anesthesia    Mother N&V   Irritable bowel syndrome (IBS)    Palpitations    PONV (postoperative nausea and vomiting)    Past Surgical History:  Procedure Laterality Date   ADENOIDECTOMY     KNEE ARTHROSCOPY     KNEE ARTHROSCOPY W/ ACL RECONSTRUCTION     TONSILLECTOMY     WISDOM TOOTH EXTRACTION     There are no problems to display for this patient.   PCP: Copland, Gwenlyn Found, MD  REFERRING PROVIDER: Jones Broom, MD  REFERRING DIAG: s/p L shoulder arthroscopic debridement, SAD, DCE (distal clavicle excision)  THERAPY DIAG:  Stiffness of left shoulder, not elsewhere classified  Acute pain of left shoulder  Rationale for Evaluation and Treatment Rehabilitation  ONSET DATE: 11/27/21  SUBJECTIVE:                                                                                                                                                                                      SUBJECTIVE STATEMENT: Pt reports her shoulder is feeling well but has some concerns regarding returning to work. Pt pre surgical restrictions were no pushing, pulling, or  lifting over 10lb. Would like to be able to return to limited work for limited hours on 10/22. PT instructed her we would consider this and re evaluate this next week.    Pt has MD follow up the 3rd of November   PERTINENT HISTORY: He patient is 41 y.o. female who has had left shoulder pain since an injury at work.  She responded well initially to a AC joint injection but the relief was only  temporary. She also had signs of impingement with rotator cuff related pain. Patient had surgical procedure for shoulder arthroscope with subacromial decompression as well as distal clavicular excision.   PAIN:  Are you having pain? Yes: NPRS scale: 5/10 Pain location: L shoulder  Pain description: sharp at times but constant ache Aggravating factors: movement Relieving factors: ice, norco before sleep at night, ibuprofin every 6 hours or so    PRECAUTIONS: None and Other: Goes back to work the 16th with light desk duty until she is seen for follow up Nov 3 Dr. Has also given physical therapy for him to progress patient as we see appropriate with upper extremity strengthening.  WEIGHT BEARING RESTRICTIONS No  FALLS:  Has patient fallen in last 6 months? No  LIVING ENVIRONMENT: Lives with: lives with their family, lives with their partner, and boyfriend's kids live with her part time  Lives in: House/apartment Stairs: Yes: Internal: 11 steps; on right going up and External: 1 steps; none Has following equipment at home: None  OCCUPATION: Nurse in ED  PLOF: Independent  PATIENT GOALS Full functional activities for work ( pushing, pulling, lifting  OBJECTIVE:   DIAGNOSTIC FINDINGS:  From surgical note 9/28:  PROCEDURE: L shoulder Arthroscopic extensive debridement -  Supraspinatus Tendon, and Superior Labrum Arthroscopic distal clavicle excision - Arthroscopic subacromial decompression    OPERATIVE FINDING: Exam under anesthesia: Normal Articular space: Normal Chondral surfaces:  Normal Biceps: Normal Subscapularis: Intact  Supraspinatus: Incomplete tear 10% articular sided Infraspinatus: Intact    The patient is 41 y.o. female who has had left shoulder pain since an injury at work.  She responded well initially to a AC joint injection but the relief was only temporary.  She also had signs of impingement with rotator cuff related pain.  PATIENT SURVEYS:  FOTO 46  COGNITION:  Overall cognitive status: Within functional limits for tasks assessed     SENSATION: WFL  POSTURE:  slight rounded shoulder posture  UPPER EXTREMITY ROM:   Passive ROM Right eval Left eval  Shoulder flexion WNL 143  Shoulder extension    Shoulder abduction WNL 90  Shoulder adduction    Shoulder internal rotation @ 90 ABD WNL 65  Shoulder external rotation at 20 degrees of GH ABD  WNL 45  Elbow flexion    Elbow extension    Wrist flexion    Wrist extension    Wrist ulnar deviation    Wrist radial deviation    Wrist pronation    Wrist supination    (Blank rows = not tested)  UPPER EXTREMITY MMT: Not assessed at eval secondary to recency of surgical procedure and pain with passive movements  MMT Right eval Left eval  Shoulder flexion    Shoulder extension    Shoulder abduction    Shoulder adduction    Shoulder internal rotation    Shoulder external rotation    Middle trapezius    Lower trapezius    Elbow flexion    Elbow extension    Wrist flexion    Wrist extension    Wrist ulnar deviation    Wrist radial deviation    Wrist pronation    Wrist supination    Grip strength (lbs)    (Blank rows = not tested)  SHOULDER SPECIAL TESTS:  N/A, post op   JOINT MOBILITY TESTING:  Not tested  PALPATION:  Tenderness to palpation in left shoulder generally.    TODAY'S TREATMENT:  Manual therapy  Physical therapist completes passive range  of motion with 5 to 10-second holds in all major planes of motion including flexion, scaption, abduction, and internal and  external rotation at 45 degrees of abduction.  Patient initially had some discomfort with flexion but following some interventions below range of motion did improve with less pain. -Gentle shoulder distraction and 45 degrees flexion and 45 degrees abduction x1 to 2 minutes each Gentle shoulder anterior to posterior mobs grade 1 to 2 x 1 to 2 minutes   Therapeutic exercise Supine  Scapular squeezes 10 times for 5-second holds.  No pain with movement but some discomfort in supine position without towel roll under left upper extremity  Access Code: [email or web address] URL: [email or web address] Date: 12/10/2021 Prepared by: Thresa Ross  Exercises - Isometric Shoulder Extension at Wall  - 1 x daily - 7 x weekly - 2 sets - 10 reps - 5 second hold - Isometric Shoulder External Rotation at Wall  - 1 x daily - 7 x weekly - 2 sets - 10 reps - 5 second  hold - Standing Isometric Shoulder Internal Rotation at Doorway  - 1 x daily - 7 x weekly - 2 sets - 10 reps - 5 second  hold - Isometric Shoulder Abduction at Wall  - 1 x daily - 7 x weekly - 2 sets - 10 reps - 5 second  hold - Standing Shoulder Flexion to 90 Degrees  - 1 x daily - 7 x weekly - 1 sets - 10 reps  Wall ladder flexion, x 3 with 5 second holds, great Rom without pain to 3rd rung from top  - abduction, stiffness noted approximately 60-120 degrees then no pain felt. Targeted this area with further manual therapy following this exercise.   Shoulder distraction and A/P mobs in 90 degreed abduction x 2 min   PATIENT EDUCATION: Education details: POC Person educated: Patient Education method: Explanation Education comprehension: verbalized understanding   HOME EXERCISE PROGRAM: Access Code: [email or web address] URL: [email or web address] Date: 12/10/2021 Prepared by: Thresa Ross  Exercises - Isometric Shoulder Extension at Wall  - 1 x daily - 7 x weekly - 2 sets - 10 reps - 5 second hold - Isometric Shoulder  External Rotation at Wall  - 1 x daily - 7 x weekly - 2 sets - 10 reps - 5 second  hold - Standing Isometric Shoulder Internal Rotation at Doorway  - 1 x daily - 7 x weekly - 2 sets - 10 reps - 5 second  hold - Isometric Shoulder Abduction at Wall  - 1 x daily - 7 x weekly - 2 sets - 10 reps - 5 second  hold - Standing Shoulder Flexion to 90 Degrees  - 1 x daily - 7 x weekly - 1 sets - 10 reps  Access Code: [email or web address] URL: [email or web address] Date: 12/04/2021 Prepared by: Thresa Ross  Exercises - Circular Shoulder Pendulum with Table Support  - 2-3 x daily - 7 x weekly - 20 reps - Flexion-Extension Shoulder Pendulum with Table Support  - 2-3 x daily - 7 x weekly - 1 sets - 20 reps - Horizontal Shoulder Pendulum with Table Support  - 2-3 x daily - 7 x weekly - 20 reps - Seated Shoulder Abduction Towel Slide at Table Top  - 2-3 x daily - 7 x weekly - 1 sets - 15 reps - 5 second hold  ASSESSMENT:  CLINICAL IMPRESSION: Continued with current plan of care as laid out in evaluation. Pt remains motivated to advance progress toward goals in order  to maximize independence and safety at home.  Pt began with isometric shoulder strengthening and graded AROM of UE with good response. See HEP section for updated HEP. Pt to continue with shoulder Rom exercise and ice at home in addition to new HEP.  Pt continues to demonstrate progress toward goals AEB progression of interventions this date either in volume or intensity.    OBJECTIVE IMPAIRMENTS decreased ROM, decreased strength, impaired UE functional use, and pain.   ACTIVITY LIMITATIONS carrying, lifting, bed mobility, dressing, self feeding, and reach over head  PARTICIPATION LIMITATIONS: meal prep, cleaning, laundry, driving, shopping, community activity, occupation, and yard work  PERSONAL FACTORS Profession are also affecting patient's functional outcome.   REHAB POTENTIAL: Good  CLINICAL DECISION MAKING:  Stable/uncomplicated  EVALUATION COMPLEXITY: Low   GOALS: Goals reviewed with patient? Yes  SHORT TERM GOALS: Target date: 01/07/2022  (Remove Blue Hyperlink)     Patient will be independent in home exercise program to improve strength/mobility for better functional independence with ADLs. Baseline: No HEP currently  Goal status: INITIAL   LONG TERM GOALS: Target date: 02/04/2022  (Remove Blue Hyperlink)  Pt will report resting pain levels 1/10 or less in order to indicate improvement in subjective levels of pain. Baseline: 3/10 resting, 6-7/10 with movement  Goal status: INITIAL  2.  Patient will improve Foto score to 61 or greater in order to indicate improved subjective rating of upper extremity functional use Baseline: 46 Goal status: INITIAL  3.  Patient will improve left shoulder active range of motion to 160 degrees flexion, and abduction, and 80 degrees of external and internal rotation in abducted shoulder position in order to improve patient's ability to perform functional activities. Baseline: See evaluation range of motion chart Goal status: INITIAL  4.  Patient will improve left shoulder strength in all major movements planes including abduction, scaption, flexion, external and internal rotation to 4+ out of 5 or greater in order to indicate improved shoulder strength for functional activities Baseline: Not assessed that he fell secondary to recency of surgical procedure Goal status: INITIAL  5.  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 Baseline: Unable to perform at this time without pain Goal status: INITIAL   PLAN: PT FREQUENCY: 1-2x/week  PT DURATION: 8 weeks  PLANNED INTERVENTIONS: Therapeutic exercises, Therapeutic activity, Neuromuscular re-education, Balance training, Gait training, Patient/Family education, Self Care, Joint mobilization,  and Manual therapy  PLAN FOR NEXT SESSION: Manual as needed, PROM, gentle theex as indicated   Particia Lather, PT 12/10/2021, 11:55 AM

## 2021-12-15 ENCOUNTER — Ambulatory Visit: Payer: PRIVATE HEALTH INSURANCE | Admitting: Physical Therapy

## 2021-12-15 ENCOUNTER — Encounter: Payer: Self-pay | Admitting: Physical Therapy

## 2021-12-15 DIAGNOSIS — M25612 Stiffness of left shoulder, not elsewhere classified: Secondary | ICD-10-CM

## 2021-12-15 DIAGNOSIS — M25512 Pain in left shoulder: Secondary | ICD-10-CM

## 2021-12-15 NOTE — Therapy (Signed)
OUTPATIENT PHYSICAL THERAPY SHOULDER TREATMENT   Patient Name: April Graham MRN: [id number] DOB:[birth year], 41 y.o., female Today's Date: 12/15/2021   PT End of Session - 12/15/21 1523     Visit Number 4    Number of Visits 11    Date for PT Re-Evaluation 01/29/22    Authorization Type WC    Authorization Time Period WC: 10/6-11/13 (10 treats, one eval in this time frame)    Authorization - Visit Number 4    Authorization - Number of Visits 11    Progress Note Due on Visit 10    PT Start Time 1518    PT Stop Time 1559    PT Time Calculation (min) 41 min    Activity Tolerance Patient tolerated treatment well;Patient limited by pain    Behavior During Therapy WFL for tasks assessed/performed              Past Medical History:  Diagnosis Date   Anxiety    Arthritis    Family history of adverse reaction to anesthesia    Mother N&V   Irritable bowel syndrome (IBS)    Palpitations    PONV (postoperative nausea and vomiting)    Past Surgical History:  Procedure Laterality Date   ADENOIDECTOMY     KNEE ARTHROSCOPY     KNEE ARTHROSCOPY W/ ACL RECONSTRUCTION     TONSILLECTOMY     WISDOM TOOTH EXTRACTION     There are no problems to display for this patient.   PCP: Copland, Gay Filler, MD  REFERRING PROVIDER: Tania Ade, MD  REFERRING DIAG: s/p L shoulder arthroscopic debridement, SAD, DCE (distal clavicle excision)  THERAPY DIAG:  Stiffness of left shoulder, not elsewhere classified  Acute pain of left shoulder  Rationale for Evaluation and Treatment Rehabilitation  ONSET DATE: 11/27/21  SUBJECTIVE:                                                                                                                                                                                      SUBJECTIVE STATEMENT: Pt reports her shoulder is feeling well but has some concerns regarding returning to work. Pt pre surgical restrictions were no pushing, pulling, or  lifting over 10lb. Would like to be able to return to limited work for limited hours on 10/22. PT instructed her we would consider this and re evaluate this next week.    Pt has MD follow up the 3rd of November   PERTINENT HISTORY: Patient is 41 y.o. female who has had left shoulder pain since an injury at work.  She responded well initially to a AC joint injection but the relief was only temporary.  She also had signs of impingement with rotator cuff related pain. Patient had surgical procedure for shoulder arthroscope with subacromial decompression as well as distal clavicular excision.   PAIN:  Are you having pain? Yes: NPRS scale: 5/10 Pain location: L shoulder  Pain description: sharp at times but constant ache Aggravating factors: movement Relieving factors: ice, norco before sleep at night, ibuprofin every 6 hours or so    PRECAUTIONS: None and Other: Goes back to work the 16th with light desk duty until she is seen for follow up Nov 3 Dr. Has also given physical therapy for [place] (PT) to progress patient as we see appropriate with upper extremity strengthening.  WEIGHT BEARING RESTRICTIONS No  FALLS:  Has patient fallen in last 6 months? No  LIVING ENVIRONMENT: Lives with: lives with their family, lives with their partner, and boyfriend's kids live with her part time  Lives in: House/apartment Stairs: Yes: Internal: 11 steps; on right going up and External: 1 steps; none Has following equipment at home: None  OCCUPATION: Nurse in ED  PLOF: Independent  PATIENT GOALS Full functional activities for work ( pushing, pulling, lifting  OBJECTIVE:   DIAGNOSTIC FINDINGS:  From surgical note 9/28:  PROCEDURE: L shoulder Arthroscopic extensive debridement -  Supraspinatus Tendon, and Superior Labrum Arthroscopic distal clavicle excision - Arthroscopic subacromial decompression    OPERATIVE FINDING: Exam under anesthesia: Normal Articular space: Normal Chondral surfaces:  Normal Biceps: Normal Subscapularis: Intact  Supraspinatus: Incomplete tear 10% articular sided Infraspinatus: Intact    The patient is 41 y.o. female who has had left shoulder pain since an injury at work.  She responded well initially to a AC joint injection but the relief was only temporary.  She also had signs of impingement with rotator cuff related pain.  PATIENT SURVEYS:  FOTO 46  COGNITION:  Overall cognitive status: Within functional limits for tasks assessed     SENSATION: WFL  POSTURE:  slight rounded shoulder posture  UPPER EXTREMITY ROM:   Passive ROM Right eval Left eval  Shoulder flexion WNL 143  Shoulder extension    Shoulder abduction WNL 90  Shoulder adduction    Shoulder internal rotation @ 90 ABD WNL 65  Shoulder external rotation at 20 degrees of GH ABD  WNL 45  Elbow flexion    Elbow extension    Wrist flexion    Wrist extension    Wrist ulnar deviation    Wrist radial deviation    Wrist pronation    Wrist supination    (Blank rows = not tested)  UPPER EXTREMITY MMT: Not assessed at eval secondary to recency of surgical procedure and pain with passive movements  MMT Right eval Left eval  Shoulder flexion    Shoulder extension    Shoulder abduction    Shoulder adduction    Shoulder internal rotation    Shoulder external rotation    Middle trapezius    Lower trapezius    Elbow flexion    Elbow extension    Wrist flexion    Wrist extension    Wrist ulnar deviation    Wrist radial deviation    Wrist pronation    Wrist supination    Grip strength (lbs)    (Blank rows = not tested)  SHOULDER SPECIAL TESTS:  N/A, post op   JOINT MOBILITY TESTING:  Not tested  PALPATION:  Tenderness to palpation in left shoulder generally.    TODAY'S TREATMENT:  Manual therapy Gentle end range abduction stretch, no  pain noted  Gentle shoulder anterior to posterior mobs grade 1 to 2 x 1 to 2 minutes R UT stretch with Gh depression 2 x 45  seconds.   Therapeutic exercise  Sic fit level 4 x 3 min ea direction seat level 9  - no pain but some fatigue noted.    Sidelying: Abduction at 90 degrees with rhythmic stabilization x 45 seconds x 3 rounds  -to activate [place] stabilizers in this ROM   Supine chest press with 5# bar 2 x 10 reps   Wall ladder flexion, x 3 with 5 second holds,- flexion 153 degrees   Wall slides on door with washcloth ( AAROM) -x 10 abduction x 5 second holds  -x 3 flexion slides for instruction in adding to home program.   PATIENT EDUCATION: Education details: POC Person educated: Patient Education method: Explanation Education comprehension: verbalized understanding   HOME EXERCISE PROGRAM: Access Code: [email or web address] URL: [email or web address] Date: 12/10/2021 Prepared by: Rivka Barbara  Exercises - Isometric Shoulder Extension at [place]  - 1 x daily - 7 x weekly - 2 sets - 10 reps - 5 second hold - Isometric Shoulder External Rotation at Wall  - 1 x daily - 7 x weekly - 2 sets - 10 reps - 5 second  hold - Standing Isometric Shoulder Internal Rotation at Doorway  - 1 x daily - 7 x weekly - 2 sets - 10 reps - 5 second  hold - Isometric Shoulder Abduction at Wall  - 1 x daily - 7 x weekly - 2 sets - 10 reps - 5 second  hold - Standing Shoulder Flexion to 90 Degrees  - 1 x daily - 7 x weekly - 1 sets - 10 reps  Access Code: [email or web address] URL: [email or web address] Date: 12/04/2021 Prepared by: Rivka Barbara  Exercises - Circular Shoulder Pendulum with Table Support  - 2-3 x daily - 7 x weekly - 20 reps - Flexion-Extension Shoulder Pendulum with Table Support  - 2-3 x daily - 7 x weekly - 1 sets - 20 reps - Horizontal Shoulder Pendulum with Table Support  - 2-3 x daily - 7 x weekly - 20 reps - Seated Shoulder Abduction Towel Slide at Table Top  - 2-3 x daily - 7 x weekly - 1 sets - 15 reps - 5 second hold  ASSESSMENT:  CLINICAL IMPRESSION:  Continued with current  plan of care as laid out in evaluation. Pt remains motivated to advance progress toward goals in order to maximize independence and safety at home. Began with light AROM of the shoulder as well as PNF style challenges to the right shoulder. Pt to progress with shoulder Rom exercise and ice at home in addition to new HEP. Pt progressing well with interventions this date.   Pt continues to demonstrate progress toward goals AEB progression of interventions this date either in volume or intensity.    OBJECTIVE IMPAIRMENTS decreased ROM, decreased strength, impaired UE functional use, and pain.   ACTIVITY LIMITATIONS carrying, lifting, bed mobility, dressing, self feeding, and reach over head  PARTICIPATION LIMITATIONS: meal prep, cleaning, laundry, driving, shopping, community activity, occupation, and yard work  PERSONAL FACTORS Profession are also affecting patient's functional outcome.   REHAB POTENTIAL: Good  CLINICAL DECISION MAKING: Stable/uncomplicated  EVALUATION COMPLEXITY: Low   GOALS: Goals reviewed with patient? Yes  SHORT TERM GOALS: Target date: 01/12/2022  (Remove Blue Hyperlink)     Patient will be independent in home exercise program to improve strength/mobility for better functional  independence with ADLs. Baseline: No HEP currently  Goal status: INITIAL   LONG TERM GOALS: Target date: 02/09/2022  (Remove Blue Hyperlink)  Pt will report resting pain levels 1/10 or less in order to indicate improvement in subjective levels of pain. Baseline: 3/10 resting, 6-7/10 with movement  Goal status: INITIAL  2.  Patient will improve Foto score to 61 or greater in order to indicate improved subjective rating of upper extremity functional use Baseline: 46 Goal status: INITIAL  3.  Patient will improve left shoulder active range of motion to 160 degrees flexion, and abduction, and 80 degrees of external and internal rotation in abducted shoulder position in order to improve  patient's ability to perform functional activities. Baseline: See evaluation range of motion chart Goal status: INITIAL  4.  Patient will improve left shoulder strength in all major movements planes including abduction, scaption, flexion, external and internal rotation to 4+ out of 5 or greater in order to indicate improved shoulder strength for functional activities Baseline: Not assessed that he fell secondary to recency of surgical procedure Goal status: INITIAL  5.  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 Baseline: Unable to perform at this time without pain Goal status: INITIAL   PLAN: PT FREQUENCY: 1-2x/week  PT DURATION: 8 weeks  PLANNED INTERVENTIONS: Therapeutic exercises, Therapeutic activity, Neuromuscular re-education, Balance training, Gait training, Patient/Family education, Self Care, Joint mobilization, and Manual therapy  PLAN FOR NEXT SESSION: Manual as needed, PROM, gentle theex as indicated   Particia Lather, PT 12/15/2021, 5:05 PM

## 2021-12-16 NOTE — Therapy (Unsigned)
OUTPATIENT PHYSICAL THERAPY SHOULDER TREATMENT   Patient Name: April Graham MRN: [id number] DOB:[birth year], 41 y.o., female Today's Date: 12/17/2021   PT End of Session - 12/17/21 1024     Visit Number 5    Number of Visits 11    Date for PT Re-Evaluation 01/29/22    Authorization Type WC    Authorization Time Period WC: 10/6-11/13 (10 treats, one eval in this time frame)    Authorization - Visit Number 5    Authorization - Number of Visits 11    Progress Note Due on Visit 10    PT Start Time 1021    PT Stop Time 1059    PT Time Calculation (min) 38 min    Activity Tolerance Patient tolerated treatment well;Patient limited by pain    Behavior During Therapy WFL for tasks assessed/performed               Past Medical History:  Diagnosis Date   Anxiety    Arthritis    Family history of adverse reaction to anesthesia    Mother N&V   Irritable bowel syndrome (IBS)    Palpitations    PONV (postoperative nausea and vomiting)    Past Surgical History:  Procedure Laterality Date   ADENOIDECTOMY     KNEE ARTHROSCOPY     KNEE ARTHROSCOPY W/ ACL RECONSTRUCTION     TONSILLECTOMY     WISDOM TOOTH EXTRACTION     There are no problems to display for this patient.   PCP: Copland, Gwenlyn Found, MD  REFERRING PROVIDER: Jones Broom, MD  REFERRING DIAG: s/p L shoulder arthroscopic debridement, SAD, DCE (distal clavicle excision)  THERAPY DIAG:  Stiffness of left shoulder, not elsewhere classified  Acute pain of left shoulder  Rationale for Evaluation and Treatment Rehabilitation  ONSET DATE: 11/27/21  SUBJECTIVE:                                                                                                                                                                                      SUBJECTIVE STATEMENT: Pt further discusses return to work. PT states he will send notes to MD. Pt making good progress only has some stiffness in the AM at this point.     Pt has MD follow up the 3rd of November   PERTINENT HISTORY: Patient is 41 y.o. female who has had left shoulder pain since an injury at work.  She responded well initially to a AC joint injection but the relief was only temporary. She also had signs of impingement with rotator cuff related pain. Patient had surgical procedure for shoulder arthroscope with subacromial decompression as well as distal clavicular  excision.   PAIN:  Are you having pain? Yes: NPRS scale: 2/10 Pain location: L shoulder  Pain description: sharp at times but constant ache Aggravating factors: movement Relieving factors: ice, norco before sleep at night, ibuprofin every 6 hours or so    PRECAUTIONS: None and Other: Goes back to work the 16th with light desk duty until she is seen for follow up Nov 3 Dr. Has also given physical therapy for [place] (PT) to progress patient as we see appropriate with upper extremity strengthening.  WEIGHT BEARING RESTRICTIONS No  FALLS:  Has patient fallen in last 6 months? No  LIVING ENVIRONMENT: Lives with: lives with their family, lives with their partner, and boyfriend's kids live with her part time  Lives in: House/apartment Stairs: Yes: Internal: 11 steps; on right going up and External: 1 steps; none Has following equipment at home: None  OCCUPATION: Nurse in ED  PLOF: Independent  PATIENT GOALS Full functional activities for work ( pushing, pulling, lifting) UE endurance (changing IV bags)   OBJECTIVE:   DIAGNOSTIC FINDINGS:  From surgical note 9/28:  PROCEDURE: L shoulder Arthroscopic extensive debridement -  Supraspinatus Tendon, and Superior Labrum Arthroscopic distal clavicle excision - Arthroscopic subacromial decompression    OPERATIVE FINDING: Exam under anesthesia: Normal Articular space: Normal Chondral surfaces: Normal Biceps: Normal Subscapularis: Intact  Supraspinatus: Incomplete tear 10% articular sided Infraspinatus: Intact    The patient  is 41 y.o. female who has had left shoulder pain since an injury at work.  She responded well initially to a AC joint injection but the relief was only temporary.  She also had signs of impingement with rotator cuff related pain.  PATIENT SURVEYS:  FOTO 46  COGNITION:  Overall cognitive status: Within functional limits for tasks assessed     SENSATION: WFL  POSTURE:  slight rounded shoulder posture  UPPER EXTREMITY ROM:   Passive ROM Right eval Left eval  Shoulder flexion WNL 143  Shoulder extension    Shoulder abduction WNL 90  Shoulder adduction    Shoulder internal rotation @ 90 ABD WNL 65  Shoulder external rotation at 20 degrees of GH ABD  WNL 45  Elbow flexion    Elbow extension    Wrist flexion    Wrist extension    Wrist ulnar deviation    Wrist radial deviation    Wrist pronation    Wrist supination    (Blank rows = not tested)  UPPER EXTREMITY MMT: Not assessed at eval secondary to recency of surgical procedure and pain with passive movements  MMT Right eval Left eval  Shoulder flexion    Shoulder extension    Shoulder abduction    Shoulder adduction    Shoulder internal rotation    Shoulder external rotation    Middle trapezius    Lower trapezius    Elbow flexion    Elbow extension    Wrist flexion    Wrist extension    Wrist ulnar deviation    Wrist radial deviation    Wrist pronation    Wrist supination    Grip strength (lbs)    (Blank rows = not tested)  SHOULDER SPECIAL TESTS:  N/A, post op   JOINT MOBILITY TESTING:  Not tested  PALPATION:  Tenderness to palpation in left shoulder generally.    TODAY'S TREATMENT:    Sic fit level 4 x 3 min ea direction seat level 9  - no pain but some fatigue noted.   Matrix cable system:   -  7.5# 3 x 15 single arm rows   - some discomfort initially but improved by rep 5 on first set  -2 x 10 chest press (7.5# ea side) -2 x 10 row with straight bar simulating pulling bed sheets for nursing.  7.5# on ea side, rates easier than pushing   3 way shoulder raise in mirror for feedback x 12 ea direction  -cues to prevent compensation from [place] for [facility] elevation   Manual therapy Gentle end range abduction stretch, no pain noted  Gentle shoulder anterior to posterior mobs grade 1 to 2 x 1 to 2 minutes R [place] stretch with Gh depression 2 x 45 seconds.   Therapeutic exercise   Sidelying: Abduction at 90 degrees with rhythmic stabilization x 45 seconds x 3 rounds  -to activate [place] stabilizers in this ROM   SL shoulder ER with [place] roll 2 x 12 ( progress for HEP)  Wall ladder flexion x 3 with 5 second holds,- flexion 153 degrees  Wall ladder abduction x 3 reps with 5 seocnd holds  149 degrees GH abduction    PATIENT EDUCATION: Education details: POC Person educated: Patient Education method: Explanation Education comprehension: verbalized understanding   HOME EXERCISE PROGRAM: Access Code: [email or web address] URL: [email or web address] Date: 12/10/2021 Prepared by: Thresa Ross  Exercises - Isometric Shoulder Extension at Wall  - 1 x daily - 7 x weekly - 2 sets - 10 reps - 5 second hold - Isometric Shoulder External Rotation at Wall  - 1 x daily - 7 x weekly - 2 sets - 10 reps - 5 second  hold - Standing Isometric Shoulder Internal Rotation at Doorway  - 1 x daily - 7 x weekly - 2 sets - 10 reps - 5 second  hold - Isometric Shoulder Abduction at Wall  - 1 x daily - 7 x weekly - 2 sets - 10 reps - 5 second  hold - Standing Shoulder Flexion to 90 Degrees  - 1 x daily - 7 x weekly - 1 sets - 10 reps  Access Code: [email or web address] URL: [email or web address] Date: 12/04/2021 Prepared by: Thresa Ross  Exercises - Circular Shoulder Pendulum with Table Support  - 2-3 x daily - 7 x weekly - 20 reps - Flexion-Extension Shoulder Pendulum with Table Support  - 2-3 x daily - 7 x weekly - 1 sets - 20 reps - Horizontal Shoulder Pendulum with Table Support  - 2-3 x  daily - 7 x weekly - 20 reps - Seated Shoulder Abduction Towel Slide at Table Top  - 2-3 x daily - 7 x weekly - 1 sets - 15 reps - 5 second hold  ASSESSMENT:  CLINICAL IMPRESSION:  Continued with current plan of care as laid out in evaluation. Pt remains motivated to advance progress toward goals in order to maximize independence and safety at home. Pt instructed in additional shoulder strengthening home exercises to complete at home. Pt AAROM progressing very well, still has some difficulty with full R AROM, particularly in side lying GH abduction. Pt will hope to return to work between now and next session. Pt able to push and pull 7.5# on the involved UE today and is progressing with AAROM to near full ROM.  Pt continues to demonstrate progress toward goals AEB progression of interventions this date either in volume or intensity.    OBJECTIVE IMPAIRMENTS decreased ROM, decreased strength, impaired UE functional use, and pain.   ACTIVITY LIMITATIONS carrying, lifting, bed mobility, dressing, self feeding, and reach over head  PARTICIPATION LIMITATIONS: meal prep, cleaning, laundry, driving, shopping, community activity, occupation, and yard work  PERSONAL FACTORS Profession are also affecting patient's functional outcome.   REHAB POTENTIAL: Good  CLINICAL DECISION MAKING: Stable/uncomplicated  EVALUATION COMPLEXITY: Low   GOALS: Goals reviewed with patient? Yes  SHORT TERM GOALS: Target date: 01/14/2022  (Remove Blue Hyperlink)     Patient will be independent in home exercise program to improve strength/mobility for better functional independence with ADLs. Baseline: No HEP currently  Goal status: INITIAL   LONG TERM GOALS: Target date: 02/11/2022  (Remove Blue Hyperlink)  Pt will report resting pain levels 1/10 or less in order to indicate improvement in subjective levels of pain. Baseline: 3/10 resting, 6-7/10 with movement  Goal status: INITIAL  2.  Patient will  improve Foto score to 61 or greater in order to indicate improved subjective rating of upper extremity functional use Baseline: 46 Goal status: INITIAL  3.  Patient will improve left shoulder active range of motion to 160 degrees flexion, and abduction, and 80 degrees of external and internal rotation in abducted shoulder position in order to improve patient's ability to perform functional activities. Baseline: See evaluation range of motion chart Goal status: INITIAL  4.  Patient will improve left shoulder strength in all major movements planes including abduction, scaption, flexion, external and internal rotation to 4+ out of 5 or greater in order to indicate improved shoulder strength for functional activities Baseline: Not assessed that he fell secondary to recency of surgical procedure Goal status: INITIAL  5.  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 Baseline: Unable to perform at this time without pain Goal status: INITIAL   PLAN: PT FREQUENCY: 1-2x/week  PT DURATION: 8 weeks  PLANNED INTERVENTIONS: Therapeutic exercises, Therapeutic activity, Neuromuscular re-education, Balance training, Gait training, Patient/Family education, Self Care, Joint mobilization, and Manual therapy  PLAN FOR NEXT SESSION: Manual as needed, PROM, gentle theex as indicated   Norman Herrlich, PT 12/17/2021, 10:24 AM

## 2021-12-17 ENCOUNTER — Ambulatory Visit: Payer: PRIVATE HEALTH INSURANCE | Attending: Orthopedic Surgery | Admitting: Physical Therapy

## 2021-12-17 ENCOUNTER — Encounter: Payer: Self-pay | Admitting: Physical Therapy

## 2021-12-17 DIAGNOSIS — M6281 Muscle weakness (generalized): Secondary | ICD-10-CM | POA: Diagnosis present

## 2021-12-17 DIAGNOSIS — M25612 Stiffness of left shoulder, not elsewhere classified: Secondary | ICD-10-CM

## 2021-12-17 DIAGNOSIS — R262 Difficulty in walking, not elsewhere classified: Secondary | ICD-10-CM | POA: Insufficient documentation

## 2021-12-17 DIAGNOSIS — M25512 Pain in left shoulder: Secondary | ICD-10-CM | POA: Diagnosis present

## 2021-12-21 ENCOUNTER — Ambulatory Visit: Payer: PRIVATE HEALTH INSURANCE

## 2021-12-21 DIAGNOSIS — M25512 Pain in left shoulder: Secondary | ICD-10-CM

## 2021-12-21 DIAGNOSIS — M25612 Stiffness of left shoulder, not elsewhere classified: Secondary | ICD-10-CM

## 2021-12-21 NOTE — Therapy (Addendum)
OUTPATIENT PHYSICAL THERAPY SHOULDER TREATMENT   Patient Name: April Graham MRN: [id number] DOB:[birth year], 41 y.o., female Today's Date: 12/21/2021   PT End of Session - 12/21/21 0903     Visit Number 6    Number of Visits 11    Date for PT Re-Evaluation 01/29/22    Authorization Type WC    Authorization Time Period WC: 10/6-11/13 (10 treats, one eval in this time frame)    Authorization - Visit Number 6    Authorization - Number of Visits 11    Progress Note Due on Visit 10    PT Start Time 0900    PT Stop Time 0940    PT Time Calculation (min) 40 min    Activity Tolerance Patient tolerated treatment well;Patient limited by pain    Behavior During Therapy WFL for tasks assessed/performed               Past Medical History:  Diagnosis Date   Anxiety    Arthritis    Family history of adverse reaction to anesthesia    Mother N&V   Irritable bowel syndrome (IBS)    Palpitations    PONV (postoperative nausea and vomiting)    Past Surgical History:  Procedure Laterality Date   ADENOIDECTOMY     KNEE ARTHROSCOPY     KNEE ARTHROSCOPY W/ ACL RECONSTRUCTION     TONSILLECTOMY     WISDOM TOOTH EXTRACTION     There are no problems to display for this patient.   PCP: Copland, Gwenlyn Found, MD  REFERRING PROVIDER: Jones Broom, MD  REFERRING DIAG: s/p L shoulder arthroscopic debridement, SAD, DCE (distal clavicle excision)  THERAPY DIAG:  Stiffness of left shoulder, not elsewhere classified  Acute pain of left shoulder  Rationale for Evaluation and Treatment Rehabilitation  ONSET DATE: 11/27/21  SUBJECTIVE:                                                                                                                                                                                      SUBJECTIVE STATEMENT: Pt doing well today, went back to work over weekend, 8 hours, did well thereafter. No pain in shoulder today. Pt was too busy to go OTF to ice her  shoulder.    PERTINENT HISTORY: Patient is 41yoF who has had left shoulder pain since an injury at work.  She responded well initially to a AC joint injection but the relief was only temporary. She also had signs of impingement with rotator cuff related pain. Patient had surgical procedure for shoulder arthroscope with subacromial decompression as well as distal clavicular excision.   PAIN:  Are you having pain? No;  0/10  PRECAUTIONS: None and Other: Back on floor on 12/20/21; folow up Nov 3 Dr. Has also given physical therapy for [place] (PT) to progress patient as we see appropriate with upper extremity strengthening.  WEIGHT BEARING RESTRICTIONS No  FALLS:  Has patient fallen in last 6 months? No  LIVING ENVIRONMENT: Lives with: lives with their family, lives with their partner, and boyfriend's kids live with her part time  Lives in: House/apartment Stairs: Yes: Internal: 11 steps; on right going up and External: 1 steps; none Has following equipment at home: None  OCCUPATION: Nurse in ED  PLOF: Independent  PATIENT GOALS Full functional activities for work ( pushing, pulling, lifting) UE endurance (changing IV bags)   OBJECTIVE:   PATIENT SURVEYS:  FOTO 46   TODAY'S TREATMENT:  -AA/ROM Sic fit level 4 x 3 min ea direction seat level 9  -Wall ladder flexion x 3 with 30 second holds,- flexion  -Wall ladder abduction x 3 reps with 30 second holds  149 degrees, abduction   Matrix cable system:    - 7.5# 3 x 15 Left single arm row, standing, tower height 12   -7.5# 2 x 15 Left single arm chest press, standing, tower height 12     -Supine Lt UT stretch 1x45sec, ART Lt UT x2 minutes, posterior glide GHJ 1x60sec c scapular stabilization (grade 3)      -Rt sidelying Left shoulder ER 0lb 1x15   -Rt sidelying Left shoulder ABDCT 0lb 1x15   -Rt sidelying Left shoulder ER 0lb 1x15   -Rt sidelying Left shoulder ABDCT 0lb 1x15  -standing alternate 3 way shoulder raise c mirror  visual biofeedback BUE (flexion, scaption, ABDCT)    PATIENT EDUCATION: Education details: POC Person educated: Patient Education method: Explanation Education comprehension: verbalized understanding   HOME EXERCISE PROGRAM: Access Code: [email or web address] URL: [email or web address] Date: 12/10/2021 Prepared by: Thresa Ross  Exercises - Isometric Shoulder Extension at Wall  - 1 x daily - 7 x weekly - 2 sets - 10 reps - 5 second hold - Isometric Shoulder External Rotation at Wall  - 1 x daily - 7 x weekly - 2 sets - 10 reps - 5 second  hold - Standing Isometric Shoulder Internal Rotation at Doorway  - 1 x daily - 7 x weekly - 2 sets - 10 reps - 5 second  hold - Isometric Shoulder Abduction at Wall  - 1 x daily - 7 x weekly - 2 sets - 10 reps - 5 second  hold - Standing Shoulder Flexion to 90 Degrees  - 1 x daily - 7 x weekly - 1 sets - 10 reps  Access Code: [email or web address] URL: [email or web address] Date: 12/04/2021 Prepared by: Thresa Ross  Exercises - Circular Shoulder Pendulum with Table Support  - 2-3 x daily - 7 x weekly - 20 reps - Flexion-Extension Shoulder Pendulum with Table Support  - 2-3 x daily - 7 x weekly - 1 sets - 20 reps - Horizontal Shoulder Pendulum with Table Support  - 2-3 x daily - 7 x weekly - 20 reps - Seated Shoulder Abduction Towel Slide at Table Top  - 2-3 x daily - 7 x weekly - 1 sets - 15 reps - 5 second hold  ASSESSMENT:  CLINICAL IMPRESSION:  Continued with exercises to advance ROM, strength, and gross motor control. Pain well controlled in session, feels better after interventions, but does feel tired by end of session. Pt continues to demonstrate progress toward goals AEB progression of interventions  this date either in volume or intensity.   OBJECTIVE IMPAIRMENTS decreased ROM, decreased strength, impaired UE functional use, and pain.   ACTIVITY LIMITATIONS carrying, lifting, bed mobility, dressing, self feeding, and  reach over head  PARTICIPATION LIMITATIONS: meal prep, cleaning, laundry, driving, shopping, community activity, occupation, and yard work  PERSONAL FACTORS Profession are also affecting patient's functional outcome.   REHAB POTENTIAL: Good  CLINICAL DECISION MAKING: Stable/uncomplicated  EVALUATION COMPLEXITY: Low   GOALS: Goals reviewed with patient? Yes  SHORT TERM GOALS: Target date: 01/18/2022  (Remove Blue Hyperlink)     Patient will be independent in home exercise program to improve strength/mobility for better functional independence with ADLs. Baseline: No HEP currently  Goal status: INITIAL   LONG TERM GOALS: Target date: 02/15/2022  (Remove Blue Hyperlink)  Pt will report resting pain levels 1/10 or less in order to indicate improvement in subjective levels of pain. Baseline: 3/10 resting, 6-7/10 with movement  Goal status: INITIAL  2.  Patient will improve Foto score to 61 or greater in order to indicate improved subjective rating of upper extremity functional use Baseline: 46 Goal status: INITIAL  3.  Patient will improve left shoulder active range of motion to 160 degrees flexion, and abduction, and 80 degrees of external and internal rotation in abducted shoulder position in order to improve patient's ability to perform functional activities. Baseline: See evaluation range of motion chart Goal status: INITIAL  4.  Patient will improve left shoulder strength in all major movements planes including abduction, scaption, flexion, external and internal rotation to 4+ out of 5 or greater in order to indicate improved shoulder strength for functional activities Baseline: Not assessed that he fell secondary to recency of surgical procedure Goal status: INITIAL  5.  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 Baseline: Unable to perform at this time  without pain Goal status: INITIAL   PLAN: PT FREQUENCY: 1-2x/week  PT DURATION: 8 weeks  PLANNED INTERVENTIONS: Therapeutic exercises, Therapeutic activity, Neuromuscular re-education, Balance training, Gait training, Patient/Family education, Self Care, Joint mobilization, and Manual therapy  PLAN FOR NEXT SESSION: Manual as needed, PROM, gentle theex as indicated    Clair Alfieri C, PT 12/21/2021, 9:06 AM   9:28 AM, 12/21/21 Etta Grandchild, PT, DPT Physical Therapist - [place] [phone]

## 2021-12-24 ENCOUNTER — Ambulatory Visit: Payer: PRIVATE HEALTH INSURANCE

## 2021-12-24 DIAGNOSIS — M25512 Pain in left shoulder: Secondary | ICD-10-CM

## 2021-12-24 DIAGNOSIS — M6281 Muscle weakness (generalized): Secondary | ICD-10-CM

## 2021-12-24 DIAGNOSIS — M25612 Stiffness of left shoulder, not elsewhere classified: Secondary | ICD-10-CM | POA: Diagnosis not present

## 2021-12-24 DIAGNOSIS — R262 Difficulty in walking, not elsewhere classified: Secondary | ICD-10-CM

## 2021-12-24 NOTE — Therapy (Addendum)
OUTPATIENT PHYSICAL THERAPY SHOULDER TREATMENT   Patient Name: April Graham MRN: [id number] DOB:[birth year], 41 y.o., female Today's Date: 12/24/2021   PT End of Session - 12/24/21 0804     Visit Number 7    Number of Visits 11    Date for PT Re-Evaluation 01/29/22    Authorization Type WC    Authorization Time Period WC: 10/6-11/13 (10 treats, one eval in this time frame)    Authorization - Visit Number 7    Authorization - Number of Visits 11    Progress Note Due on Visit 10    PT Start Time 0804    PT Stop Time 0845    PT Time Calculation (min) 41 min    Activity Tolerance Patient tolerated treatment well;Patient limited by pain    Behavior During Therapy WFL for tasks assessed/performed               Past Medical History:  Diagnosis Date   Anxiety    Arthritis    Family history of adverse reaction to anesthesia    Mother N&V   Irritable bowel syndrome (IBS)    Palpitations    PONV (postoperative nausea and vomiting)    Past Surgical History:  Procedure Laterality Date   ADENOIDECTOMY     KNEE ARTHROSCOPY     KNEE ARTHROSCOPY W/ ACL RECONSTRUCTION     TONSILLECTOMY     WISDOM TOOTH EXTRACTION     There are no problems to display for this patient.   PCP: Pearline Cables, MD  REFERRING PROVIDER: Jones Broom, MD  REFERRING DIAG: s/p L shoulder arthroscopic debridement, SAD, DCE (distal clavicle excision)  THERAPY DIAG:  Difficulty in walking, not elsewhere classified  Muscle weakness (generalized)  Stiffness of left shoulder, not elsewhere classified  Acute pain of left shoulder  Rationale for Evaluation and Treatment Rehabilitation  ONSET DATE: 11/27/21  SUBJECTIVE:                                                                                                                                                                                      SUBJECTIVE STATEMENT:  Pt notes that she likely did too much in triage yesterday with the  pt's.  Pt notes that she hasn't felt bad, but just that she did too much pushing/puling although she tries to be careful not to do.   PERTINENT HISTORY: Patient is 41yoF who has had left shoulder pain since an injury at work.  She responded well initially to a AC joint injection but the relief was only temporary. She also had signs of impingement with rotator cuff related pain. Patient had surgical procedure for shoulder  arthroscope with subacromial decompression as well as distal clavicular excision.   PAIN:  Are you having pain? No; 0/10  PRECAUTIONS: None and Other: Back on floor on 12/20/21; folow up Nov 3 Dr. Has also given physical therapy for [place] (PT) to progress patient as we see appropriate with upper extremity strengthening.  WEIGHT BEARING RESTRICTIONS No  FALLS:  Has patient fallen in last 6 months? No  LIVING ENVIRONMENT: Lives with: lives with their family, lives with their partner, and boyfriend's kids live with her part time  Lives in: House/apartment Stairs: Yes: Internal: 11 steps; on right going up and External: 1 steps; none Has following equipment at home: None  OCCUPATION: Nurse in ED  PLOF: Independent  PATIENT GOALS Full functional activities for work ( pushing, pulling, lifting) UE endurance (changing IV bags)   OBJECTIVE:   PATIENT SURVEYS:  FOTO 46   TODAY'S TREATMENT:  -AA/ROM Sic fit level 4 x 3 min ea direction seat level 9  -Wall ladder flexion x 3 with 30 second holds,- flexion  -Wall ladder abduction x 3 reps with 30 second holds  149 degrees, abduction   Matrix cable system:   7.5# 3 x 15 Left single arm row, standing, tower height 12 7.5# 3 x 15 Left single arm chest press, standing, tower height 12   Seated L UT/Levator stretches, x30 sec bouts each muscle group    Resisted ER (elbow bent 90/90) walkouts at cable column, 7.5#, x10 (use of 2.5# AW on machine) Resisted IR (elbow bent 90/90) walkouts at cable column, 7.5#, x10 (use of  2.5# AW on machine)     PATIENT EDUCATION: Education details: POC Person educated: Patient Education method: Explanation Education comprehension: verbalized understanding   HOME EXERCISE PROGRAM: Access Code: [email or web address] URL: [email or web address] Date: 12/10/2021 Prepared by: Rivka Barbara  Exercises - Isometric Shoulder Extension at [place]  - 1 x daily - 7 x weekly - 2 sets - 10 reps - 5 second hold - Isometric Shoulder External Rotation at Wall  - 1 x daily - 7 x weekly - 2 sets - 10 reps - 5 second  hold - Standing Isometric Shoulder Internal Rotation at Doorway  - 1 x daily - 7 x weekly - 2 sets - 10 reps - 5 second  hold - Isometric Shoulder Abduction at Wall  - 1 x daily - 7 x weekly - 2 sets - 10 reps - 5 second  hold - Standing Shoulder Flexion to 90 Degrees  - 1 x daily - 7 x weekly - 1 sets - 10 reps  Access Code: [email or web address] URL: [email or web address] Date: 12/04/2021 Prepared by: Rivka Barbara  Exercises - Circular Shoulder Pendulum with Table Support  - 2-3 x daily - 7 x weekly - 20 reps - Flexion-Extension Shoulder Pendulum with Table Support  - 2-3 x daily - 7 x weekly - 1 sets - 20 reps - Horizontal Shoulder Pendulum with Table Support  - 2-3 x daily - 7 x weekly - 20 reps - Seated Shoulder Abduction Towel Slide at Table Top  - 2-3 x daily - 7 x weekly - 1 sets - 15 reps - 5 second hold  ASSESSMENT:  CLINICAL IMPRESSION:  Pt is making significant progress towards goals and is noting improved ROM at each subsequent visit.  Pt also able to accept more resistance with activities/exercises.  Pt advised to continue to perform exercises at home and tasks at work within pain tolerable ranges.   Pt will continue to benefit  from skilled therapy to address remaining deficits in order to improve overall QoL and return to PLOF.        OBJECTIVE IMPAIRMENTS decreased ROM, decreased strength, impaired UE functional use, and pain.   ACTIVITY  LIMITATIONS carrying, lifting, bed mobility, dressing, self feeding, and reach over head  PARTICIPATION LIMITATIONS: meal prep, cleaning, laundry, driving, shopping, community activity, occupation, and yard work  PERSONAL FACTORS Profession are also affecting patient's functional outcome.   REHAB POTENTIAL: Good  CLINICAL DECISION MAKING: Stable/uncomplicated  EVALUATION COMPLEXITY: Low   GOALS: Goals reviewed with patient? Yes  SHORT TERM GOALS: Target date: 01/21/2022  (Remove Blue Hyperlink)     Patient will be independent in home exercise program to improve strength/mobility for better functional independence with ADLs. Baseline: No HEP currently  Goal status: INITIAL   LONG TERM GOALS: Target date: 02/18/2022  (Remove Blue Hyperlink)  Pt will report resting pain levels 1/10 or less in order to indicate improvement in subjective levels of pain. Baseline: 3/10 resting, 6-7/10 with movement  Goal status: INITIAL  2.  Patient will improve Foto score to 61 or greater in order to indicate improved subjective rating of upper extremity functional use Baseline: 46 Goal status: INITIAL  3.  Patient will improve left shoulder active range of motion to 160 degrees flexion, and abduction, and 80 degrees of external and internal rotation in abducted shoulder position in order to improve patient's ability to perform functional activities. Baseline: See evaluation range of motion chart Goal status: INITIAL  4.  Patient will improve left shoulder strength in all major movements planes including abduction, scaption, flexion, external and internal rotation to 4+ out of 5 or greater in order to indicate improved shoulder strength for functional activities Baseline: Not assessed that he fell secondary to recency of surgical procedure Goal status: INITIAL  5.  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 Baseline: Unable to perform at this time without pain Goal status: INITIAL   PLAN: PT FREQUENCY: 1-2x/week  PT DURATION: 8 weeks  PLANNED INTERVENTIONS: Therapeutic exercises, Therapeutic activity, Neuromuscular re-education, Balance training, Gait training, Patient/Family education, Self Care, Joint mobilization, and Manual therapy  PLAN FOR NEXT SESSION: Manual as needed, PROM, gentle therex as indicated   Nolon Bussing, PT, DPT Physical Therapist- [facility]  12/24/21, 2:51 PM

## 2021-12-30 ENCOUNTER — Ambulatory Visit: Payer: PRIVATE HEALTH INSURANCE | Attending: Orthopedic Surgery | Admitting: Physical Therapy

## 2021-12-30 ENCOUNTER — Encounter: Payer: Self-pay | Admitting: Physical Therapy

## 2021-12-30 DIAGNOSIS — M25512 Pain in left shoulder: Secondary | ICD-10-CM | POA: Diagnosis present

## 2021-12-30 DIAGNOSIS — M6281 Muscle weakness (generalized): Secondary | ICD-10-CM | POA: Insufficient documentation

## 2021-12-30 DIAGNOSIS — M25612 Stiffness of left shoulder, not elsewhere classified: Secondary | ICD-10-CM | POA: Diagnosis not present

## 2021-12-30 DIAGNOSIS — R262 Difficulty in walking, not elsewhere classified: Secondary | ICD-10-CM | POA: Insufficient documentation

## 2021-12-30 NOTE — Therapy (Signed)
OUTPATIENT PHYSICAL THERAPY SHOULDER TREATMENT   Patient Name: April Graham MRN: [id number] DOB:1980-09-09, 41 y.o., female Today's Date: 12/30/2021   PT End of Session - 12/30/21 0930     Visit Number 8    Number of Visits 11    Date for PT Re-Evaluation 01/29/22    Authorization Type WC    Authorization Time Period WC: 10/6-11/13 (10 treats, one eval in this time frame)    Authorization - Visit Number 8    Authorization - Number of Visits 11    Progress Note Due on Visit 10    PT Start Time 0931    PT Stop Time 1014    PT Time Calculation (min) 43 min    Activity Tolerance Patient tolerated treatment well;Patient limited by pain    Behavior During Therapy WFL for tasks assessed/performed                Past Medical History:  Diagnosis Date   Anxiety    Arthritis    Family history of adverse reaction to anesthesia    Mother N&V   Irritable bowel syndrome (IBS)    Palpitations    PONV (postoperative nausea and vomiting)    Past Surgical History:  Procedure Laterality Date   ADENOIDECTOMY     KNEE ARTHROSCOPY     KNEE ARTHROSCOPY W/ ACL RECONSTRUCTION     TONSILLECTOMY     WISDOM TOOTH EXTRACTION     There are no problems to display for this patient.   PCP: Copland, Gwenlyn Found, MD  REFERRING PROVIDER: Jones Broom, MD  REFERRING DIAG: s/p L shoulder arthroscopic debridement, SAD, DCE (distal clavicle excision)  THERAPY DIAG:  Difficulty in walking, not elsewhere classified  Muscle weakness (generalized)  Stiffness of left shoulder, not elsewhere classified  Rationale for Evaluation and Treatment Rehabilitation  ONSET DATE: 11/27/21  SUBJECTIVE:                                                                                                                                                                                      SUBJECTIVE STATEMENT:  Pt reports doing well, has some soreness following her shifts at work.    PERTINENT  HISTORY: Patient is 41yoF who has had left shoulder pain since an injury at work.  She responded well initially to a AC joint injection but the relief was only temporary. She also had signs of impingement with rotator cuff related pain. Patient had surgical procedure for shoulder arthroscope with subacromial decompression as well as distal clavicular excision.   PAIN:  Are you having pain? No; 0/10  PRECAUTIONS: None and Other: Back on floor on 12/20/21; folow  up Nov 3 Dr. Has also given physical therapy for [place] (PT) to progress patient as we see appropriate with upper extremity strengthening.  WEIGHT BEARING RESTRICTIONS No  FALLS:  Has patient fallen in last 6 months? No  LIVING ENVIRONMENT: Lives with: lives with their family, lives with their partner, and boyfriend's kids live with her part time  Lives in: House/apartment Stairs: Yes: Internal: 11 steps; on right going up and External: 1 steps; none Has following equipment at home: None  OCCUPATION: Nurse in ED  PLOF: Independent  PATIENT GOALS Full functional activities for work ( pushing, pulling, lifting) UE endurance (changing IV bags)   OBJECTIVE:   PATIENT SURVEYS:  FOTO 46   TODAY'S TREATMENT:  -AA/ROM Sci fit level 4 x 3 min ea direction seat level 9  -Wall ladder flexion x 3 with 10 second holds,- flexion  -Wall ladder abduction x 3 reps with 10 second holds   Matrix cable system:   12.5# 2 x 10 Left single arm row, standing, tower height 10 7.5# 2 x 15 Left single arm chest press, standing, tower height 10    Resisted ER Banded elbow bent 90/90 with towell roll): YTB 3 x 15 reps, YTB provided for HEP progression Resisted IR (elbow bent 90/90 with towell roll) IR with YTB 3 x 15 reps, YTB provided for HEP progression   Three-way shoulder raises with flexion scaption and abduction with 1 pound weight in left upper extremity but still completing activity bilaterally in order to improve symmetry.  Patient has no  pain with some fatigue with this activity  Following therapy patient reports she does have to go to work which is in the same building as clinic.  Patient instructed she can ice for prolonged period if she wanted to wait in waiting area and return [place] at her convenience.  Patient provided ice pack and end of session and ices for approximately 15 to 20 minutes there is no charge associated with this activity.     PATIENT EDUCATION: Education details: POC Person educated: Patient Education method: Explanation Education comprehension: verbalized understanding   HOME EXERCISE PROGRAM: Access Code: [email or web address] URL: [email or web address] Date: 12/10/2021 Prepared by: Thresa Ross  Exercises - Isometric Shoulder Extension at Wall  - 1 x daily - 7 x weekly - 2 sets - 10 reps - 5 second hold - Isometric Shoulder External Rotation at Wall  - 1 x daily - 7 x weekly - 2 sets - 10 reps - 5 second  hold - Standing Isometric Shoulder Internal Rotation at Doorway  - 1 x daily - 7 x weekly - 2 sets - 10 reps - 5 second  hold - Isometric Shoulder Abduction at Wall  - 1 x daily - 7 x weekly - 2 sets - 10 reps - 5 second  hold - Standing Shoulder Flexion to 90 Degrees  - 1 x daily - 7 x weekly - 1 sets - 10 reps  Access Code: [email or web address] URL: [email or web address] Date: 12/04/2021 Prepared by: Thresa Ross  Exercises - Circular Shoulder Pendulum with Table Support  - 2-3 x daily - 7 x weekly - 20 reps - Flexion-Extension Shoulder Pendulum with Table Support  - 2-3 x daily - 7 x weekly - 1 sets - 20 reps - Horizontal Shoulder Pendulum with Table Support  - 2-3 x daily - 7 x weekly - 20 reps - Seated Shoulder Abduction Towel Slide at Table Top  - 2-3 x daily - 7 x weekly - 1  sets - 15 reps - 5 second hold  ASSESSMENT:  CLINICAL IMPRESSION:  Patient continues to make great progress toward goals and continuing to show good range of motion and good response to  progression of resistance with various activities.  Patient does have work shift following physical therapy this date so she was allowed to utilize ice for prolonged period following session.  Patient will continue to benefit from skilled physical therapy to improve her upper extremity strength, endurance, and allow safe return to premorbid level of function.      OBJECTIVE IMPAIRMENTS decreased ROM, decreased strength, impaired UE functional use, and pain.   ACTIVITY LIMITATIONS carrying, lifting, bed mobility, dressing, self feeding, and reach over head  PARTICIPATION LIMITATIONS: meal prep, cleaning, laundry, driving, shopping, community activity, occupation, and yard work  PERSONAL FACTORS Profession are also affecting patient's functional outcome.   REHAB POTENTIAL: Good  CLINICAL DECISION MAKING: Stable/uncomplicated  EVALUATION COMPLEXITY: Low   GOALS: Goals reviewed with patient? Yes  SHORT TERM GOALS: Target date: 01/27/2022  (Remove Blue Hyperlink)     Patient will be independent in home exercise program to improve strength/mobility for better functional independence with ADLs. Baseline: No HEP currently  Goal status: INITIAL   LONG TERM GOALS: Target date: 02/24/2022  (Remove Blue Hyperlink)  Pt will report resting pain levels 1/10 or less in order to indicate improvement in subjective levels of pain. Baseline: 3/10 resting, 6-7/10 with movement  Goal status: INITIAL  2.  Patient will improve Foto score to 61 or greater in order to indicate improved subjective rating of upper extremity functional use Baseline: 46 Goal status: INITIAL  3.  Patient will improve left shoulder active range of motion to 160 degrees flexion, and abduction, and 80 degrees of external and internal rotation in abducted shoulder position in order to improve patient's ability to perform functional activities. Baseline: See evaluation range of motion chart Goal status: INITIAL  4.   Patient will improve left shoulder strength in all major movements planes including abduction, scaption, flexion, external and internal rotation to 4+ out of 5 or greater in order to indicate improved shoulder strength for functional activities Baseline: Not assessed that he fell secondary to recency of surgical procedure Goal status: INITIAL  5.  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 Baseline: Unable to perform at this time without pain Goal status: INITIAL   PLAN: PT FREQUENCY: 1-2x/week  PT DURATION: 8 weeks  PLANNED INTERVENTIONS: Therapeutic exercises, Therapeutic activity, Neuromuscular re-education, Balance training, Gait training, Patient/Family education, Self Care, Joint mobilization, and Manual therapy  PLAN FOR NEXT SESSION: Manual as needed, PROM, gentle therex as indicated   Particia Lather PT   12/30/21, 11:26 AM

## 2021-12-31 ENCOUNTER — Encounter: Payer: Self-pay | Admitting: Physical Therapy

## 2021-12-31 ENCOUNTER — Ambulatory Visit: Payer: PRIVATE HEALTH INSURANCE | Admitting: Physical Therapy

## 2021-12-31 DIAGNOSIS — M25612 Stiffness of left shoulder, not elsewhere classified: Secondary | ICD-10-CM | POA: Diagnosis not present

## 2021-12-31 DIAGNOSIS — M25512 Pain in left shoulder: Secondary | ICD-10-CM

## 2021-12-31 NOTE — Therapy (Signed)
OUTPATIENT PHYSICAL THERAPY SHOULDER TREATMENT   Patient Name: April Graham MRN: [id number] DOB:1980/07/24, 41 y.o., female Today's Date: 12/31/2021   PT End of Session - 12/31/21 1013     Visit Number 9    Number of Visits 11    Date for PT Re-Evaluation 01/29/22    Authorization Type WC    Authorization Time Period WC: 10/6-11/13 (10 treats, one eval in this time frame)    Authorization - Visit Number 9    Authorization - Number of Visits 11    Progress Note Due on Visit 10    PT Start Time 5027    PT Stop Time 1058    PT Time Calculation (min) 44 min    Activity Tolerance Patient tolerated treatment well;Patient limited by pain    Behavior During Therapy WFL for tasks assessed/performed                 Past Medical History:  Diagnosis Date   Anxiety    Arthritis    Family history of adverse reaction to anesthesia    Mother N&V   Irritable bowel syndrome (IBS)    Palpitations    PONV (postoperative nausea and vomiting)    Past Surgical History:  Procedure Laterality Date   ADENOIDECTOMY     KNEE ARTHROSCOPY     KNEE ARTHROSCOPY W/ ACL RECONSTRUCTION     TONSILLECTOMY     WISDOM TOOTH EXTRACTION     There are no problems to display for this patient.   PCP: Copland, Gay Filler, MD  REFERRING PROVIDER: Tania Ade, MD  REFERRING DIAG: s/p L shoulder arthroscopic debridement, SAD, DCE (distal clavicle excision)  THERAPY DIAG:  Stiffness of left shoulder, not elsewhere classified  Acute pain of left shoulder  Rationale for Evaluation and Treatment Rehabilitation  ONSET DATE: 11/27/21  SUBJECTIVE:                                                                                                                                                                                      SUBJECTIVE STATEMENT:  Pt reports doing well, had some difficulty putting on her jacket this morning and some soreness after work and PT yesterday. Pt is leaving for  vacation next Tuesday and has follow up with MD tomorrow.    PERTINENT HISTORY: Patient is 48yoF who has had left shoulder pain since an injury at work.  She responded well initially to a AC joint injection but the relief was only temporary. She also had signs of impingement with rotator cuff related pain. Patient had surgical procedure for shoulder arthroscope with subacromial decompression as well as distal clavicular excision.  PAIN:  Are you having pain? No; 0/10  PRECAUTIONS: None and Other: Back on floor on 12/20/21; folow up Nov 3 Dr. Has also given physical therapy for [place] (PT) to progress patient as we see appropriate with upper extremity strengthening.  WEIGHT BEARING RESTRICTIONS No  FALLS:  Has patient fallen in last 6 months? No  LIVING ENVIRONMENT: Lives with: lives with their family, lives with their partner, and boyfriend's kids live with her part time  Lives in: House/apartment Stairs: Yes: Internal: 11 steps; on right going up and External: 1 steps; none Has following equipment at home: None  OCCUPATION: Nurse in ED  PLOF: Independent  PATIENT GOALS Full functional activities for work ( pushing, pulling, lifting) UE endurance (changing IV bags)   OBJECTIVE:   PATIENT SURVEYS:  FOTO 46   TODAY'S TREATMENT:  -AA/ROM Sci fit level 4.5 x 3 min ea direction seat level 9  -Wall ladder flexion x 3 with 10 second holds,- flexion  -Wall ladder abduction x 3 reps with 10 second holds  140 ABD   Reaching cone placement to approximately 120 degrees of flexion and scaption with various angles or horizontal abduction   IR stretch with towel  3*45 seconds; improved ability to put jacket on post session.   Three-way shoulder raises with flexion scaption and abduction with 1 pound weight in upper extremity but still completing activity bilaterally in order to improve symmetry.  Patient has no pain but some fatigue with this activity Matrix cable system:   Single arm  row at varios angels ( high, mid and low row at level 15, 10 and 5 respectively on matrix height)  Paloff press x 7.5 # x 12 each direction to challenge HABD stability in shoulder    Manual therapy testing shoulder IR at various degrees of abduction, no pain noted at any point, gentle A/P mobs performed with shoulder in abduction.         PATIENT EDUCATION: Education details: POC Person educated: Patient Education method: Explanation Education comprehension: verbalized understanding   HOME EXERCISE PROGRAM: Access Code: [email or web address] URL: [email or web address] Date: 12/10/2021 Prepared by: Thresa Ross  Exercises - Isometric Shoulder Extension at Wall  - 1 x daily - 7 x weekly - 2 sets - 10 reps - 5 second hold - Isometric Shoulder External Rotation at Wall  - 1 x daily - 7 x weekly - 2 sets - 10 reps - 5 second  hold - Standing Isometric Shoulder Internal Rotation at Doorway  - 1 x daily - 7 x weekly - 2 sets - 10 reps - 5 second  hold - Isometric Shoulder Abduction at Wall  - 1 x daily - 7 x weekly - 2 sets - 10 reps - 5 second  hold - Standing Shoulder Flexion to 90 Degrees  - 1 x daily - 7 x weekly - 1 sets - 10 reps  Access Code: [email or web address] URL: [email or web address] Date: 12/04/2021 Prepared by: Thresa Ross  Exercises - Circular Shoulder Pendulum with Table Support  - 2-3 x daily - 7 x weekly - 20 reps - Flexion-Extension Shoulder Pendulum with Table Support  - 2-3 x daily - 7 x weekly - 1 sets - 20 reps - Horizontal Shoulder Pendulum with Table Support  - 2-3 x daily - 7 x weekly - 20 reps - Seated Shoulder Abduction Towel Slide at Table Top  - 2-3 x daily - 7 x weekly - 1 sets - 15 reps - 5 second hold  ASSESSMENT:  CLINICAL IMPRESSION:  Patient continues to make great progress toward goals and continuing to show good range of motion and good response to progression of resistance with various activities.  Patient made progress with  functional shoulder internal rotation with minimal stretching performed today.Pt will continue to benefit from skilled physical therapy intervention to address impairments, improve QOL, and attain therapy goals.      OBJECTIVE IMPAIRMENTS decreased ROM, decreased strength, impaired UE functional use, and pain.   ACTIVITY LIMITATIONS carrying, lifting, bed mobility, dressing, self feeding, and reach over head  PARTICIPATION LIMITATIONS: meal prep, cleaning, laundry, driving, shopping, community activity, occupation, and yard work  PERSONAL FACTORS Profession are also affecting patient's functional outcome.   REHAB POTENTIAL: Good  CLINICAL DECISION MAKING: Stable/uncomplicated  EVALUATION COMPLEXITY: Low   GOALS: Goals reviewed with patient? Yes  SHORT TERM GOALS: Target date: 01/28/2022  (Remove Blue Hyperlink)     Patient will be independent in home exercise program to improve strength/mobility for better functional independence with ADLs. Baseline: No HEP currently  Goal status: INITIAL   LONG TERM GOALS: Target date: 02/25/2022  (Remove Blue Hyperlink)  Pt will report resting pain levels 1/10 or less in order to indicate improvement in subjective levels of pain. Baseline: 3/10 resting, 6-7/10 with movement  Goal status: INITIAL  2.  Patient will improve Foto score to 61 or greater in order to indicate improved subjective rating of upper extremity functional use Baseline: 46 Goal status: INITIAL  3.  Patient will improve left shoulder active range of motion to 160 degrees flexion, and abduction, and 80 degrees of external and internal rotation in abducted shoulder position in order to improve patient's ability to perform functional activities. Baseline: See evaluation range of motion chart Goal status: INITIAL  4.  Patient will improve left shoulder strength in all major movements planes including abduction, scaption, flexion, external and internal rotation to 4+ out  of 5 or greater in order to indicate improved shoulder strength for functional activities Baseline: Not assessed that he fell secondary to recency of surgical procedure Goal status: INITIAL  5.  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 Baseline: Unable to perform at this time without pain Goal status: INITIAL   PLAN: PT FREQUENCY: 1-2x/week  PT DURATION: 8 weeks  PLANNED INTERVENTIONS: Therapeutic exercises, Therapeutic activity, Neuromuscular re-education, Balance training, Gait training, Patient/Family education, Self Care, Joint mobilization, and Manual therapy  PLAN FOR NEXT SESSION: Therex for progression to nursing activities.    Norman Herrlich PT   12/31/21, 12:42 PM

## 2022-01-12 ENCOUNTER — Ambulatory Visit: Payer: PRIVATE HEALTH INSURANCE | Admitting: Physical Therapy

## 2022-01-13 ENCOUNTER — Encounter: Payer: Self-pay | Admitting: Physical Therapy

## 2022-01-13 ENCOUNTER — Ambulatory Visit: Payer: PRIVATE HEALTH INSURANCE | Attending: Family Medicine | Admitting: Physical Therapy

## 2022-01-13 DIAGNOSIS — M25612 Stiffness of left shoulder, not elsewhere classified: Secondary | ICD-10-CM | POA: Insufficient documentation

## 2022-01-13 DIAGNOSIS — R262 Difficulty in walking, not elsewhere classified: Secondary | ICD-10-CM | POA: Insufficient documentation

## 2022-01-13 DIAGNOSIS — M25512 Pain in left shoulder: Secondary | ICD-10-CM | POA: Diagnosis present

## 2022-01-13 DIAGNOSIS — M6281 Muscle weakness (generalized): Secondary | ICD-10-CM | POA: Insufficient documentation

## 2022-01-13 NOTE — Therapy (Signed)
OUTPATIENT PHYSICAL THERAPY SHOULDER Treatment and Progress note / Physical Therapy Progress Note   Dates of reporting period  12/04/21   to   01/13/22    Patient Name: April Graham MRN: [id number] DOB:1980/11/13, 41 y.o., female Today's Date: 01/13/2022   PT End of Session - 01/13/22 0807     Visit Number 10    Number of Visits 11    Date for PT Re-Evaluation 01/29/22    Authorization Type WC    Authorization Time Period WC: 10/6-11/13 (10 treats, one eval in this time frame)    Authorization - Visit Number 10    Authorization - Number of Visits 11    Progress Note Due on Visit 10    PT Start Time 0805    PT Stop Time 0843    PT Time Calculation (min) 38 min    Activity Tolerance Patient tolerated treatment well;Patient limited by pain    Behavior During Therapy WFL for tasks assessed/performed                  Past Medical History:  Diagnosis Date   Anxiety    Arthritis    Family history of adverse reaction to anesthesia    Mother N&V   Irritable bowel syndrome (IBS)    Palpitations    PONV (postoperative nausea and vomiting)    Past Surgical History:  Procedure Laterality Date   ADENOIDECTOMY     KNEE ARTHROSCOPY     KNEE ARTHROSCOPY W/ ACL RECONSTRUCTION     TONSILLECTOMY     WISDOM TOOTH EXTRACTION     There are no problems to display for this patient.   PCP: Darreld Mclean, MD  REFERRING PROVIDER: Tania Ade, MD  REFERRING DIAG: s/p L shoulder arthroscopic debridement, SAD, DCE (distal clavicle excision)  THERAPY DIAG:  Stiffness of left shoulder, not elsewhere classified  Acute pain of left shoulder  Difficulty in walking, not elsewhere classified  Muscle weakness (generalized)  Rationale for Evaluation and Treatment Rehabilitation  ONSET DATE: 11/27/21  SUBJECTIVE:                                                                                                                                                                                       SUBJECTIVE STATEMENT:  Pt reports her MD appointment went well and the PA was very happy with her ROM. 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 Pt reports the MD kept her lifting / pulling/ pushing restrictions to 5-10 lb. Pt PA goal is by mid December to be back to doing 12  hour shifts and completing normal work.  Pt had a good vacation but was not able to complete her HEP.    PERTINENT HISTORY: Patient is 41 y.o. F who has had left shoulder pain since an injury at work.  She responded well initially to a AC joint injection but the relief was only temporary. She also had signs of impingement with rotator cuff related pain. Patient had surgical procedure for shoulder arthroscope with subacromial decompression as well as distal clavicular excision.   PAIN:  Are you having pain? Yes, sore; 2-3/10  PRECAUTIONS: None and Other: Back on floor on 12/20/21; folow up Nov 3 Dr. Has also given physical therapy for [place] (PT) to progress patient as we see appropriate with upper extremity strengthening.  WEIGHT BEARING RESTRICTIONS No  FALLS:  Has patient fallen in last 6 months? No  LIVING ENVIRONMENT: Lives with: lives with their family, lives with their partner, and boyfriend's kids live with her part time  Lives in: House/apartment Stairs: Yes: Internal: 11 steps; on right going up and External: 1 steps; none Has following equipment at home: None  OCCUPATION: Nurse in ED  PLOF: Independent  PATIENT GOALS Full functional activities for work ( pushing, pulling, lifting) UE endurance (changing IV bags)   OBJECTIVE:   PATIENT SURVEYS:  FOTO 46   TODAY'S TREATMENT: Physical therapy treatment session today consisted of completing assessment of goals and administration of testing as demonstrated and documented in flow sheet, treatment, and goals section of this note. Addition treatments may be found below.    -AA/ROM Sci fit level 4.5 x 3 min ea direction seat level 9  -Wall ladder flexion x 3 with 10 second holds,- flexion 161 degree flexion -Wall ladder abduction x 3 reps with 10 second holds  140 ABD   Single arm row at various angels ( high, mid and low row at level 15, 10 and 5 respectively on matrix height) x15 ea at 7.5#  Chest press single UE 2 x 15 @ 7.5#   Shoulder external rotation at 90 degrees of abduction x8 reps of 2.5 pounds.  No pain noted but significant fatigue noted with this activity.         PATIENT EDUCATION: Education details: POC Person educated: Patient Education method: Explanation Education comprehension: verbalized understanding   HOME EXERCISE PROGRAM: Access Code: [email or web address] URL: [email or web address] Date: 12/10/2021 Prepared by: Rivka Barbara  Exercises - Isometric Shoulder Extension at [place]  - 1 x daily - 7 x weekly - 2 sets - 10 reps - 5 second hold - Isometric Shoulder External Rotation at Wall  - 1 x daily - 7 x weekly - 2 sets - 10 reps - 5 second  hold - Standing Isometric Shoulder Internal Rotation at Doorway  - 1 x daily - 7 x weekly - 2 sets - 10 reps - 5 second  hold - Isometric Shoulder Abduction at Wall  - 1 x daily - 7 x weekly - 2 sets - 10 reps - 5 second  hold - Standing Shoulder Flexion to 90 Degrees  - 1 x daily - 7 x weekly - 1 sets - 10 reps  Access Code: [email or web address] URL: [email or web address] Date: 12/04/2021 Prepared by: Rivka Barbara  Exercises - Circular Shoulder Pendulum with Table Support  - 2-3 x daily - 7 x weekly - 20 reps - Flexion-Extension Shoulder Pendulum with Table Support  - 2-3 x daily - 7 x weekly - 1 sets - 20 reps - Horizontal Shoulder Pendulum with  Table Support  - 2-3 x daily - 7 x weekly - 20 reps - Seated Shoulder Abduction Towel Slide at Table Top  - 2-3 x daily - 7 x weekly - 1 sets - 15 reps - 5 second hold  ASSESSMENT:  CLINICAL IMPRESSION:  Patient presents  to physical therapy per progress note this date.  Patient has made significant progress with her physical therapy interventions and is making progress with her ability to return to work.  Patient is still limited in her workplace activities to restrictions set forth by Dr. For no greater than 10 pounds for pushing pulling or lifting.  Patient has met all of her range of motion goals with the exception of shoulder abduction which is progressing well at this time.  Patient has met her strength goals for manual muscle testing but still lacks adequate strength for full functional capacity at work without risk of shoulder reinjury.  In addition patient's left shoulder is still experiencing weakness compared to the contralateral upper extremity.  Patient's focus on therapeutic outcome score has also increased efficiently indicating patient's perceived functional capacity and her left upper extremity is improving significantly.  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     OBJECTIVE IMPAIRMENTS decreased ROM, decreased strength, impaired UE functional use, and pain.   ACTIVITY LIMITATIONS carrying, lifting, bed mobility, dressing, self feeding, and reach over head  PARTICIPATION LIMITATIONS: meal prep, cleaning, laundry, driving, shopping, community activity, occupation, and yard work  PERSONAL FACTORS Profession are also affecting patient's functional outcome.   REHAB POTENTIAL: Good  CLINICAL DECISION MAKING: Stable/uncomplicated  EVALUATION COMPLEXITY: Low   GOALS: Goals reviewed with patient? Yes  SHORT TERM GOALS: Target date: 13/04/2021       Patient will be independent in home exercise program to improve strength/mobility for better functional independence with ADLs. Baseline: No HEP currently   Goal status: MET   LONG TERM GOALS: Target date: 03/10/2022     Pt will report resting pain levels 1/10 or less in order to indicate improvement in subjective levels of pain. Baseline: 3/10 resting, 6-7/10 with movement 11/15: 2-3/10 soreness at worst at the moment Goal status: IN PROGRESS  2.  Patient will improve Foto score to 61 or greater in order to indicate improved subjective rating of upper extremity functional use Baseline: 46 11/15: 67 Goal status: MET  3.  Patient will improve left shoulder active range of motion to 160 degrees flexion, and abduction, and 80 degrees of external and internal rotation in abducted shoulder position in order to improve patient's ability to perform functional activities. Baseline: See evaluation range of motion chart 01/13/22: 161 flexion, 151 abduction, full ROM on L UE in ER and IR in 90 degrees of abduction  Goal status: IN PROGRESS  4.  Patient will improve left shoulder strength in all major movements planes including abduction, scaption, flexion, external and internal rotation to 4+ out of 5 or greater in order to indicate improved shoulder strength for functional activities Baseline: Not assessed that he fell secondary to recency of surgical procedure 11/15: flexion  left 4+ abduction and flexion, ER 4+, IR 4+, some pain noted with abduction and ER combined  Goal status: IN PROGRESS  5.  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 Baseline: Unable to perform at this time without pain, 11/15: pt lifting/pushing/ pulling with 10lb weight limit and has some soreness but no pain following day  Goal status: IN PROGRESS   PLAN: PT FREQUENCY: 1-2x/week  PT DURATION: 8 weeks  PLANNED INTERVENTIONS: Therapeutic exercises, Therapeutic activity, Neuromuscular re-education, Balance training, Gait training, Patient/Family education, Self  Care, Joint mobilization, and Manual therapy  PLAN FOR NEXT SESSION: Therex/theract for progression to nursing activities.    Particia Lather PT   01/13/22, 8:08 AM

## 2022-01-14 ENCOUNTER — Ambulatory Visit: Payer: PRIVATE HEALTH INSURANCE | Admitting: Physical Therapy

## 2022-01-14 DIAGNOSIS — M25512 Pain in left shoulder: Secondary | ICD-10-CM

## 2022-01-14 DIAGNOSIS — M25612 Stiffness of left shoulder, not elsewhere classified: Secondary | ICD-10-CM | POA: Diagnosis not present

## 2022-01-14 DIAGNOSIS — R262 Difficulty in walking, not elsewhere classified: Secondary | ICD-10-CM

## 2022-01-14 DIAGNOSIS — M6281 Muscle weakness (generalized): Secondary | ICD-10-CM

## 2022-01-14 NOTE — Therapy (Signed)
OUTPATIENT PHYSICAL THERAPY SHOULDER Treatment   Patient Name: April Graham MRN: [id number] DOB:1980-10-31, 41 y.o., female Today's Date: 01/14/2022   PT End of Session - 01/14/22 1433     Visit Number 11    Number of Visits 21    Date for PT Re-Evaluation 01/29/22    Authorization Type WC    Authorization Time Period WC: 10/6-11/13 (10 treats, one eval in this time frame)    Authorization - Visit Number 11    Authorization - Number of Visits 20    Progress Note Due on Visit 20    Activity Tolerance Patient tolerated treatment well;Patient limited by pain    Behavior During Therapy WFL for tasks assessed/performed                   Past Medical History:  Diagnosis Date   Anxiety    Arthritis    Family history of adverse reaction to anesthesia    Mother N&V   Irritable bowel syndrome (IBS)    Palpitations    PONV (postoperative nausea and vomiting)    Past Surgical History:  Procedure Laterality Date   ADENOIDECTOMY     KNEE ARTHROSCOPY     KNEE ARTHROSCOPY W/ ACL RECONSTRUCTION     TONSILLECTOMY     WISDOM TOOTH EXTRACTION     There are no problems to display for this patient.   PCP: Darreld Mclean, MD  REFERRING PROVIDER: Tania Ade, MD  REFERRING DIAG: s/p L shoulder arthroscopic debridement, SAD, DCE (distal clavicle excision)  THERAPY DIAG:  Stiffness of left shoulder, not elsewhere classified  Acute pain of left shoulder  Difficulty in walking, not elsewhere classified  Muscle weakness (generalized)  Rationale for Evaluation and Treatment Rehabilitation  ONSET DATE: 11/27/21  SUBJECTIVE:                                                                                                                                                                                      SUBJECTIVE STATEMENT:  Pt reports improved soreness compared to yesterday.    PERTINENT HISTORY: Patient is 41 y.o. F who has had left shoulder pain since an  injury at work.  She responded well initially to a AC joint injection but the relief was only temporary. She also had signs of impingement with rotator cuff related pain. Patient had surgical procedure for shoulder arthroscope with subacromial decompression as well as distal clavicular excision.   PAIN:  Are you having pain? Yes, sore; 1/10  PRECAUTIONS: None and Other: Back on floor on 12/20/21; folow up Nov 3 Dr. Has also given physical therapy for [place] (PT) to progress patient as  we see appropriate with upper extremity strengthening.  WEIGHT BEARING RESTRICTIONS No  FALLS:  Has patient fallen in last 6 months? No  LIVING ENVIRONMENT: Lives with: lives with their family, lives with their partner, and boyfriend's kids live with her part time  Lives in: House/apartment Stairs: Yes: Internal: 11 steps; on right going up and External: 1 steps; none Has following equipment at home: None  OCCUPATION: Nurse in ED  PLOF: Independent  PATIENT GOALS Full functional activities for work ( pushing, pulling, lifting) UE endurance (changing IV bags)   OBJECTIVE:   PATIENT SURVEYS:  FOTO 46   TODAY'S TREATMENT: Physical therapy treatment session today consisted of completing assessment of goals and administration of testing as demonstrated and documented in flow sheet, treatment, and goals section of this note. Addition treatments may be found below.   -AA/ROM Sci fit level 4.6 x 3 min ea direction seat level 9  -Wall ladder flexion x 3 with 10 second holds, -Wall ladder abduction x 3 reps with 10 second holds    Single arm row at various angels ( high, mid and low row at level 15, 10 and 5 respectively on matrix height) 2*10 ea at 12.5# - no pain noted, some fatigue noted   Cone placement on shelf at approximately 110-120 degrees shoulder flexion with 1.5# AW on L UE 2 x 7 cones -fatigue noted on second round.   Chest press single UE 2 x 15 @ 7.5#   Paloff press to challenge shoulder  HABD and HADD 2 x 10 ea direction 12.5# resistance   Shoulder IR and ER on cable system: 2.5# ER and IR   Picking up box approximately 12 lb box ( 8# weight plus wooden box)  and placing on chair 2 x 6          PATIENT EDUCATION: Education details: POC Person educated: Patient Education method: Explanation Education comprehension: verbalized understanding   HOME EXERCISE PROGRAM: Access Code: [email or web address] URL: [email or web address] Date: 12/10/2021 Prepared by: Rivka Barbara  Exercises - Isometric Shoulder Extension at [place]  - 1 x daily - 7 x weekly - 2 sets - 10 reps - 5 second hold - Isometric Shoulder External Rotation at Wall  - 1 x daily - 7 x weekly - 2 sets - 10 reps - 5 second  hold - Standing Isometric Shoulder Internal Rotation at Doorway  - 1 x daily - 7 x weekly - 2 sets - 10 reps - 5 second  hold - Isometric Shoulder Abduction at Wall  - 1 x daily - 7 x weekly - 2 sets - 10 reps - 5 second  hold - Standing Shoulder Flexion to 90 Degrees  - 1 x daily - 7 x weekly - 1 sets - 10 reps  Access Code: [email or web address] URL: [email or web address] Date: 12/04/2021 Prepared by: Rivka Barbara  Exercises - Circular Shoulder Pendulum with Table Support  - 2-3 x daily - 7 x weekly - 20 reps - Flexion-Extension Shoulder Pendulum with Table Support  - 2-3 x daily - 7 x weekly - 1 sets - 20 reps - Horizontal Shoulder Pendulum with Table Support  - 2-3 x daily - 7 x weekly - 20 reps - Seated Shoulder Abduction Towel Slide at Table Top  - 2-3 x daily - 7 x weekly - 1 sets - 15 reps - 5 second hold  ASSESSMENT:  CLINICAL IMPRESSION:  Continued with current plan of care as laid out in evaluation and recent prior sessions. Pt remains  motivated to advance progress toward goals in order to maximize independence and safety with work related activities involving use of her involved UE. Pt requires high level assistance and cuing for completion of exercises in  order to provide adequate level of stimulation challenge while minimizing pain and discomfort when possible. Pt closely monitored throughout session pt response and to maximize patient safety during interventions. Pt continues to demonstrate progress toward goals AEB progression of interventions this date either in volume or intensity.     OBJECTIVE IMPAIRMENTS decreased ROM, decreased strength, impaired UE functional use, and pain.   ACTIVITY LIMITATIONS carrying, lifting, bed mobility, dressing, self feeding, and reach over head  PARTICIPATION LIMITATIONS: meal prep, cleaning, laundry, driving, shopping, community activity, occupation, and yard work  PERSONAL FACTORS Profession are also affecting patient's functional outcome.   REHAB POTENTIAL: Good  CLINICAL DECISION MAKING: Stable/uncomplicated  EVALUATION COMPLEXITY: Low   GOALS: Goals reviewed with patient? Yes  SHORT TERM GOALS: Target date: 13/04/2021       Patient will be independent in home exercise program to improve strength/mobility for better functional independence with ADLs. Baseline: No HEP currently  Goal status: MET   LONG TERM GOALS: Target date: 03/10/2022     Pt will report resting pain levels 1/10 or less in order to indicate improvement in subjective levels of pain. Baseline: 3/10 resting, 6-7/10 with movement 11/15: 2-3/10 soreness at worst at the moment Goal status: IN PROGRESS  2.  Patient will improve Foto score to 61 or greater in order to indicate improved subjective rating of upper extremity functional use Baseline: 46 11/15: 67 Goal status: MET  3.  Patient will improve left shoulder active range of motion to 160 degrees flexion, and abduction, and 80 degrees of external and internal rotation in abducted shoulder position in order to improve patient's ability to perform functional activities. Baseline: See evaluation range of motion chart 01/13/22: 161 flexion, 151 abduction, full ROM on L UE  in ER and IR in 90 degrees of abduction  Goal status: IN PROGRESS  4.  Patient will improve left shoulder strength in all major movements planes including abduction, scaption, flexion, external and internal rotation to 4+ out of 5 or greater in order to indicate improved shoulder strength for functional activities Baseline: Not assessed that he fell secondary to recency of surgical procedure 11/15: flexion  left 4+ abduction and flexion, ER 4+, IR 4+, some pain noted with abduction and ER combined  Goal status: IN PROGRESS  5.  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 Baseline: Unable to perform at this time without pain, 11/15: pt lifting/pushing/ pulling with 10lb weight limit and has some soreness but no pain following day  Goal status: IN PROGRESS   PLAN: PT FREQUENCY: 1-2x/week  PT DURATION: 8 weeks  PLANNED INTERVENTIONS: Therapeutic exercises, Therapeutic activity, Neuromuscular re-education, Balance training, Gait training, Patient/Family education, Self Care, Joint mobilization, and Manual therapy  PLAN FOR NEXT SESSION: Therex/theract for progression to nursing activities.    Particia Lather PT   01/14/22, 3:38 PM

## 2022-01-18 ENCOUNTER — Ambulatory Visit: Payer: PRIVATE HEALTH INSURANCE

## 2022-01-20 ENCOUNTER — Ambulatory Visit: Payer: PRIVATE HEALTH INSURANCE

## 2022-01-25 ENCOUNTER — Ambulatory Visit: Payer: PRIVATE HEALTH INSURANCE

## 2022-01-25 DIAGNOSIS — M25612 Stiffness of left shoulder, not elsewhere classified: Secondary | ICD-10-CM | POA: Diagnosis not present

## 2022-01-25 DIAGNOSIS — R262 Difficulty in walking, not elsewhere classified: Secondary | ICD-10-CM

## 2022-01-25 DIAGNOSIS — M25512 Pain in left shoulder: Secondary | ICD-10-CM

## 2022-01-25 DIAGNOSIS — M6281 Muscle weakness (generalized): Secondary | ICD-10-CM

## 2022-01-25 NOTE — Therapy (Signed)
OUTPATIENT PHYSICAL THERAPY SHOULDER Treatment   Patient Name: April Graham MRN: [id number] DOB:September 19, 1980, 41 y.o., female Today's Date: 01/25/2022   PT End of Session - 01/25/22 1103     Visit Number 12    Number of Visits 21    Date for PT Re-Evaluation 01/29/22    Authorization Type WC    Authorization Time Period WC: 10/6-11/13 (10 treats, one eval in this time frame)    Authorization - Number of Visits 20    Progress Note Due on Visit 20    PT Start Time 1104    PT Stop Time 1145    PT Time Calculation (min) 41 min    Activity Tolerance Patient tolerated treatment well;Patient limited by pain    Behavior During Therapy WFL for tasks assessed/performed              Past Medical History:  Diagnosis Date   Anxiety    Arthritis    Family history of adverse reaction to anesthesia    Mother N&V   Irritable bowel syndrome (IBS)    Palpitations    PONV (postoperative nausea and vomiting)    Past Surgical History:  Procedure Laterality Date   ADENOIDECTOMY     KNEE ARTHROSCOPY     KNEE ARTHROSCOPY W/ ACL RECONSTRUCTION     TONSILLECTOMY     WISDOM TOOTH EXTRACTION     There are no problems to display for this patient.   PCP: Darreld Mclean, MD  REFERRING PROVIDER: Tania Ade, MD  REFERRING DIAG: s/p L shoulder arthroscopic debridement, SAD, DCE (distal clavicle excision)  THERAPY DIAG:  Difficulty in walking, not elsewhere classified  Muscle weakness (generalized)  Stiffness of left shoulder, not elsewhere classified  Acute pain of left shoulder  Rationale for Evaluation and Treatment Rehabilitation  ONSET DATE: 11/27/21  SUBJECTIVE:                                                                                                                                                                                      SUBJECTIVE STATEMENT:    Pt reports she is doing much better than she was last week and finally has her voice.   PERTINENT  HISTORY:  Patient is 41 y.o. F who has had left shoulder pain since an injury at work.  She responded well initially to a AC joint injection but the relief was only temporary. She also had signs of impingement with rotator cuff related pain. Patient had surgical procedure for shoulder arthroscope with subacromial decompression as well as distal clavicular excision.   PAIN:  Are you having pain? Yes, sore; 2/10 most likely from the way  she sleeps.  PRECAUTIONS: None and Other: Back on floor on 12/20/21; folow up Nov 3 Dr. Has also given physical therapy for [place] (PT) to progress patient as we see appropriate with upper extremity strengthening.  WEIGHT BEARING RESTRICTIONS No  FALLS:  Has patient fallen in last 6 months? No  LIVING ENVIRONMENT: Lives with: lives with their family, lives with their partner, and boyfriend's kids live with her part time  Lives in: House/apartment Stairs: Yes: Internal: 11 steps; on right going up and External: 1 steps; none Has following equipment at home: None  OCCUPATION: Nurse in ED  PLOF: Independent  PATIENT GOALS Full functional activities for work ( pushing, pulling, lifting) UE endurance (changing IV bags)   OBJECTIVE:   PATIENT SURVEYS:  FOTO 46   TODAY'S TREATMENT:    -AA/ROM Sci fit level 4.5 x 3 min ea direction seat level 7 -Wall ladder flexion x 3 with 10 second holds -Wall ladder abduction x 3 reps with 10 second holds    Single arm row at various angels ( high, mid and low row at level 15, 10 and 5 respectively on matrix height) 2x10 ea at 12.5#  Standing shoulder adduction, 2x15 with 7.5#  Chest press single UE 2 x 15 @ 7.5#     Manual:  Prone STM with TP release technique applied to the UT's, specifically on the L side, pt with notable relief following conclusion of manual therapy approach    Trigger Point Dry Needling (TDN), unbilled Education performed with patient regarding potential benefit of TDN. Reviewed  precautions and risks with patient. Reviewed special precautions/risks over lung fields which include pneumothorax. Reviewed signs and symptoms of pneumothorax and advised pt to go to ER immediately if these symptoms develop advise them of dry needling treatment. Extensive time spent with pt to ensure full understanding of TDN risks. Pt provided verbal consent to treatment. TDN performed to  with 0.25 x 30 single needle placements with local twitch response (LTR). Pistoning technique utilized. Improved pain-free motion following intervention.       PATIENT EDUCATION: Education details: POC Person educated: Patient Education method: Explanation Education comprehension: verbalized understanding   HOME EXERCISE PROGRAM: Access Code: [email or web address] URL: [email or web address] Date: 12/10/2021 Prepared by: Rivka Barbara  Exercises - Isometric Shoulder Extension at [place]  - 1 x daily - 7 x weekly - 2 sets - 10 reps - 5 second hold - Isometric Shoulder External Rotation at Wall  - 1 x daily - 7 x weekly - 2 sets - 10 reps - 5 second  hold - Standing Isometric Shoulder Internal Rotation at Doorway  - 1 x daily - 7 x weekly - 2 sets - 10 reps - 5 second  hold - Isometric Shoulder Abduction at Wall  - 1 x daily - 7 x weekly - 2 sets - 10 reps - 5 second  hold - Standing Shoulder Flexion to 90 Degrees  - 1 x daily - 7 x weekly - 1 sets - 10 reps  Access Code: [email or web address] URL: [email or web address] Date: 12/04/2021 Prepared by: Rivka Barbara  Exercises - Circular Shoulder Pendulum with Table Support  - 2-3 x daily - 7 x weekly - 20 reps - Flexion-Extension Shoulder Pendulum with Table Support  - 2-3 x daily - 7 x weekly - 1 sets - 20 reps - Horizontal Shoulder Pendulum with Table Support  - 2-3 x daily - 7 x weekly - 20 reps - Seated Shoulder Abduction Towel Slide at Table Top  -  2-3 x daily - 7 x weekly - 1 sets - 15 reps - 5 second hold  ASSESSMENT:  CLINICAL  IMPRESSION:  Pt responded well to the exercises given and performed well with the tasks.  Pt noted to have some increased pain in the UT's with the L side being worse than the R.  Pt noted that the pain started following the [employer] hockey game last night and being squeezed into the seats.  Pt noted good relaxation following TDN and manual therapy approach to alleviate the pain and increase tissue extensibility.   Pt will continue to benefit from skilled therapy to address remaining deficits in order to improve overall QoL and return to PLOF.      OBJECTIVE IMPAIRMENTS decreased ROM, decreased strength, impaired UE functional use, and pain.   ACTIVITY LIMITATIONS carrying, lifting, bed mobility, dressing, self feeding, and reach over head  PARTICIPATION LIMITATIONS: meal prep, cleaning, laundry, driving, shopping, community activity, occupation, and yard work  PERSONAL FACTORS Profession are also affecting patient's functional outcome.   REHAB POTENTIAL: Good  CLINICAL DECISION MAKING: Stable/uncomplicated  EVALUATION COMPLEXITY: Low   GOALS: Goals reviewed with patient? Yes  SHORT TERM GOALS: Target date: 13/04/2021       Patient will be independent in home exercise program to improve strength/mobility for better functional independence with ADLs. Baseline: No HEP currently  Goal status: MET   LONG TERM GOALS: Target date: 03/10/2022     Pt will report resting pain levels 1/10 or less in order to indicate improvement in subjective levels of pain. Baseline: 3/10 resting, 6-7/10 with movement 11/15: 2-3/10 soreness at worst at the moment Goal status: IN PROGRESS  2.  Patient will improve Foto score to 61 or greater in order to indicate improved subjective rating of upper extremity functional use Baseline: 46 11/15: 67 Goal status: MET  3.  Patient will improve left shoulder active range of motion to 160 degrees flexion, and abduction, and 80 degrees of external and  internal rotation in abducted shoulder position in order to improve patient's ability to perform functional activities. Baseline: See evaluation range of motion chart 01/13/22: 161 flexion, 151 abduction, full ROM on L UE in ER and IR in 90 degrees of abduction  Goal status: IN PROGRESS  4.  Patient will improve left shoulder strength in all major movements planes including abduction, scaption, flexion, external and internal rotation to 4+ out of 5 or greater in order to indicate improved shoulder strength for functional activities Baseline: Not assessed that he fell secondary to recency of surgical procedure 11/15: flexion  left 4+ abduction and flexion, ER 4+, IR 4+, some pain noted with abduction and ER combined  Goal status: IN PROGRESS  5.  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 Baseline: Unable to perform at this time without pain, 11/15: pt lifting/pushing/ pulling with 10lb weight limit and has some soreness but no pain following day  Goal status: IN PROGRESS   PLAN: PT FREQUENCY: 1-2x/week  PT DURATION: 8 weeks  PLANNED INTERVENTIONS: Therapeutic exercises, Therapeutic activity, Neuromuscular re-education, Balance training, Gait training, Patient/Family education, Self Care, Joint mobilization, and Manual therapy  PLAN FOR NEXT SESSION: Therex/theract for progression to nursing activities.    Gwenlyn Saran, PT, DPT Physical Therapist- [facility]  01/25/22, 11:04 AM

## 2022-01-28 ENCOUNTER — Ambulatory Visit: Payer: PRIVATE HEALTH INSURANCE | Admitting: Physical Therapy

## 2022-01-28 DIAGNOSIS — M25612 Stiffness of left shoulder, not elsewhere classified: Secondary | ICD-10-CM | POA: Diagnosis not present

## 2022-01-28 DIAGNOSIS — M6281 Muscle weakness (generalized): Secondary | ICD-10-CM

## 2022-01-28 DIAGNOSIS — M25512 Pain in left shoulder: Secondary | ICD-10-CM

## 2022-01-28 NOTE — Therapy (Signed)
OUTPATIENT PHYSICAL THERAPY TREATMENT follow-up  April Graham Name: April Graham MRN: [id number] DOB:22-Nov-1980, 41 y.o., female Today's Date: 01/28/2022   PT End of Session - 01/28/22 1517     Visit Number 13    Number of Visits 21    Date for PT Re-Evaluation 01/29/22    Authorization Type WC    Authorization Time Period WC: 10/6-11/13 (10 treats, one eval in this time frame)    Authorization - Visit Number 4    Authorization - Number of Visits 10    Progress Note Due on Visit 20    PT Start Time 1519    PT Stop Time 1557    PT Time Calculation (min) 38 min    Activity Tolerance April Graham tolerated treatment well;April Graham limited by pain    Behavior During Therapy WFL for tasks assessed/performed              Past Medical History:  Diagnosis Date   Anxiety    Arthritis    Family history of adverse reaction to anesthesia    Mother N&V   Irritable bowel syndrome (IBS)    Palpitations    PONV (postoperative nausea and vomiting)    Past Surgical History:  Procedure Laterality Date   ADENOIDECTOMY     KNEE ARTHROSCOPY     KNEE ARTHROSCOPY W/ ACL RECONSTRUCTION     TONSILLECTOMY     WISDOM TOOTH EXTRACTION     There are no problems to display for this April Graham.   PCP: Copland, Gay Filler, MD  REFERRING PROVIDER: Tania Ade, MD  REFERRING DIAG: s/p L shoulder arthroscopic debridement, SAD, DCE (distal clavicle excision)  THERAPY DIAG:  Stiffness of left shoulder, not elsewhere classified  Muscle weakness (generalized)  Acute pain of left shoulder  Rationale for Evaluation and Treatment Rehabilitation  ONSET DATE: 11/27/21  SUBJECTIVE:                                                                                                                                                                                      SUBJECTIVE STATEMENT:    Pt reports she is doing much better than she was last week and finally has her voice.   PERTINENT  HISTORY:  April Graham is 41 y.o. F who has had left shoulder pain since an injury at work.  She responded well initially to a AC joint injection but the relief was only temporary. She also had signs of impingement with rotator cuff related pain. April Graham had surgical procedure for shoulder arthroscope with subacromial decompression as well as distal clavicular excision.   PAIN:  Are you having pain? Yes, sore; 2/10 most likely from the way  she sleeps.  PRECAUTIONS: None and Other: Back on floor on 12/20/21; folow up Nov 3 Dr. Has also given physical therapy for [place] (PT) to progress April Graham as we see appropriate with upper extremity strengthening.  WEIGHT BEARING RESTRICTIONS No  FALLS:  Has April Graham fallen in last 6 months? No  LIVING ENVIRONMENT: Lives with: lives with their family, lives with their partner, and boyfriend's kids live with her part time  Lives in: House/apartment Stairs: Yes: Internal: 11 steps; on right going up and External: 1 steps; none Has following equipment at home: None  OCCUPATION: Nurse in ED  PLOF: Independent  April Graham GOALS Full functional activities for work ( pushing, pulling, lifting) UE endurance (changing IV bags)   OBJECTIVE:   April Graham SURVEYS:  FOTO 46   TODAY'S TREATMENT: 01/28/22  TE  -AA/ROM Sci fit level 4.5 x 3 min ea direction seat level 7 -Wall ladder flexion x 3 with 10 second holds AAROM measured: WNL 172 degrees -Wall ladder abduction x 3 reps with 10 second holds  155 degrees abduction of AAROM with this measured on round 3   Single arm row at various angels ( high, mid and low row at level 15, 10 and 5 respectively on matrix height) 2x10 ea at 12.5#  Standing shoulder adduction, 2x15 with 7.5#  Chest press single UE 2 x 15 @ 7.5#   Cone placement on shelf at approximately 110-120 degrees shoulder flexion with 1.5# AW on L UE 3 x 8 cones -Some discomfort noted in the upper extremity and April Graham does not report it as pain.  Low  delt row/ pull on cable system with 2.5# AW   Chest press single UE 2 x 15 @ 7.5#   Shoulder IR and ER on cable system: 2.5# ER and IR with towel roll under left upper extremity and axilla           April Graham EDUCATION: Education details: POC Person educated: April Graham Education method: Explanation Education comprehension: verbalized understanding   HOME EXERCISE PROGRAM: Access Code: [email or web address] URL: [email or web address] Date: 12/10/2021 Prepared by: Rivka Barbara  Exercises - Isometric Shoulder Extension at [place]  - 1 x daily - 7 x weekly - 2 sets - 10 reps - 5 second hold - Isometric Shoulder External Rotation at Wall  - 1 x daily - 7 x weekly - 2 sets - 10 reps - 5 second  hold - Standing Isometric Shoulder Internal Rotation at Doorway  - 1 x daily - 7 x weekly - 2 sets - 10 reps - 5 second  hold - Isometric Shoulder Abduction at Wall  - 1 x daily - 7 x weekly - 2 sets - 10 reps - 5 second  hold - Standing Shoulder Flexion to 90 Degrees  - 1 x daily - 7 x weekly - 1 sets - 10 reps  Access Code: [email or web address] URL: [email or web address] Date: 12/04/2021 Prepared by: Rivka Barbara  Exercises - Circular Shoulder Pendulum with Table Support  - 2-3 x daily - 7 x weekly - 20 reps - Flexion-Extension Shoulder Pendulum with Table Support  - 2-3 x daily - 7 x weekly - 1 sets - 20 reps - Horizontal Shoulder Pendulum with Table Support  - 2-3 x daily - 7 x weekly - 20 reps - Seated Shoulder Abduction Towel Slide at Table Top  - 2-3 x daily - 7 x weekly - 1 sets - 15 reps - 5 second hold  ASSESSMENT:  CLINICAL IMPRESSION:  Continued with current plan of  care as laid out in evaluation and recent prior sessions. Pt remains motivated to advance progress toward goals in order to maximize independence and safety at home. Pt requires high level assistance and cuing for completion of exercises in order to provide adequate level of stimulation challenge while  minimizing pain and discomfort when possible. Pt closely monitored throughout session pt response and to maximize April Graham safety during interventions.  April Graham progressing well with all tasks with interventions performed this date as well as with her ability to perform activities within her environment at work.  Pt continues to demonstrate progress toward goals AEB progression of interventions this date either in volume or intensity. Pt will continue to benefit from skilled physical therapy intervention to address impairments, improve QOL, and attain therapy goals.       OBJECTIVE IMPAIRMENTS decreased ROM, decreased strength, impaired UE functional use, and pain.   ACTIVITY LIMITATIONS carrying, lifting, bed mobility, dressing, self feeding, and reach over head  PARTICIPATION LIMITATIONS: meal prep, cleaning, laundry, driving, shopping, community activity, occupation, and yard work  PERSONAL FACTORS Profession are also affecting April Graham's functional outcome.   REHAB POTENTIAL: Good  CLINICAL DECISION MAKING: Stable/uncomplicated  EVALUATION COMPLEXITY: Low   GOALS: Goals reviewed with April Graham? Yes  SHORT TERM GOALS: Target date: 13/04/2021       April Graham will be independent in home exercise program to improve strength/mobility for better functional independence with ADLs. Baseline: No HEP currently  Goal status: MET   LONG TERM GOALS: Target date: 03/10/2022     Pt will report resting pain levels 1/10 or less in order to indicate improvement in subjective levels of pain. Baseline: 3/10 resting, 6-7/10 with movement 11/15: 2-3/10 soreness at worst at the moment Goal status: IN PROGRESS  2.  April Graham will improve Foto score to 61 or greater in order to indicate improved subjective rating of upper extremity functional use Baseline: 46 11/15: 67 Goal status: MET  3.  April Graham will improve left shoulder active range of motion to 160 degrees flexion, and abduction, and 80 degrees of  external and internal rotation in abducted shoulder position in order to improve April Graham's ability to perform functional activities. Baseline: See evaluation range of motion chart 01/13/22: 161 flexion, 151 abduction, full ROM on L UE in ER and IR in 90 degrees of abduction  Goal status: IN PROGRESS  4.  April Graham will improve left shoulder strength in all major movements planes including abduction, scaption, flexion, external and internal rotation to 4+ out of 5 or greater in order to indicate improved shoulder strength for functional activities Baseline: Not assessed that he fell secondary to recency of surgical procedure 11/15: flexion  left 4+ abduction and flexion, ER 4+, IR 4+, some pain noted with abduction and ER combined  Goal status: IN PROGRESS  5.  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 Baseline: Unable to perform at this time without pain, 11/15: pt lifting/pushing/ pulling with 10lb weight limit and has some soreness but no pain following day  Goal status: IN PROGRESS   PLAN: PT FREQUENCY: 1-2x/week  PT DURATION: 8 weeks  PLANNED INTERVENTIONS: Therapeutic exercises, Therapeutic activity, Neuromuscular re-education, Balance training, Gait training, April Graham/Family education, Self Care, Joint mobilization, and Manual therapy  PLAN FOR NEXT SESSION: Therex/theract for progression to nursing activities.   Particia Lather PT  01/28/22, 4:48 PM

## 2022-02-02 NOTE — Therapy (Incomplete)
OUTPATIENT PHYSICAL THERAPY TREATMENT follow-up  Patient Name: April Graham MRN: [id number] DOB:Aug 05, 1980, 41 y.o., female Today's Date: 02/02/2022      Past Medical History:  Diagnosis Date   Anxiety    Arthritis    Family history of adverse reaction to anesthesia    Mother N&V   Irritable bowel syndrome (IBS)    Palpitations    PONV (postoperative nausea and vomiting)    Past Surgical History:  Procedure Laterality Date   ADENOIDECTOMY     KNEE ARTHROSCOPY     KNEE ARTHROSCOPY W/ ACL RECONSTRUCTION     TONSILLECTOMY     WISDOM TOOTH EXTRACTION     There are no problems to display for this patient.   PCP: Copland, Gay Filler, MD  REFERRING PROVIDER: Tania Ade, MD  REFERRING DIAG: s/p L shoulder arthroscopic debridement, SAD, DCE (distal clavicle excision)  THERAPY DIAG:  No diagnosis found.  Rationale for Evaluation and Treatment Rehabilitation  ONSET DATE: 11/27/21  SUBJECTIVE:                                                                                                                                                                                      SUBJECTIVE STATEMENT:   *** Pt reports she is doing much better than she was last week and finally has her voice.   PERTINENT HISTORY:  Patient is 41 y.o. F who has had left shoulder pain since an injury at work.  She responded well initially to a AC joint injection but the relief was only temporary. She also had signs of impingement with rotator cuff related pain. Patient had surgical procedure for shoulder arthroscope with subacromial decompression as well as distal clavicular excision.   PAIN:  Are you having pain? Yes, sore; 2/10 most likely from the way she sleeps.  PRECAUTIONS: None and Other: Back on floor on 12/20/21; folow up Nov 3 Dr. Has also given physical therapy for [place] (PT) to progress patient as we see appropriate with upper extremity strengthening.  WEIGHT BEARING RESTRICTIONS  No  FALLS:  Has patient fallen in last 6 months? No  LIVING ENVIRONMENT: Lives with: lives with their family, lives with their partner, and boyfriend's kids live with her part time  Lives in: House/apartment Stairs: Yes: Internal: 11 steps; on right going up and External: 1 steps; none Has following equipment at home: None  OCCUPATION: Nurse in ED  PLOF: Independent  PATIENT GOALS Full functional activities for work ( pushing, pulling, lifting) UE endurance (changing IV bags)   OBJECTIVE:   PATIENT SURVEYS:  FOTO 46   TODAY'S TREATMENT: 02/02/22  ***  TherEx:  -AA/ROM Sci fit level  4.5 x 3 min ea direction seat level 7 -Wall ladder flexion x 3 with 10 second holds AAROM measured: WNL 172 degrees -Wall ladder abduction x 3 reps with 10 second holds  155 degrees abduction of AAROM with this measured on round 3   Single arm row at various angels ( high, mid and low row at level 15, 10 and 5 respectively on matrix height) 2x10 ea at 12.5#  Standing shoulder adduction, 2x15 with 7.5#  Chest press single UE 2 x 15 @ 7.5#   Cone placement on shelf at approximately 110-120 degrees shoulder flexion with 1.5# AW on L UE 3 x 8 cones -Some discomfort noted in the upper extremity and patient does not report it as pain.  Low delt row/ pull on cable system with 2.5# AW   Chest press single UE 2 x 15 @ 7.5#   Shoulder IR and ER on cable system: 2.5# ER and IR with towel roll under left upper extremity and axilla           PATIENT EDUCATION: Education details: POC Person educated: Patient Education method: Explanation Education comprehension: verbalized understanding   HOME EXERCISE PROGRAM: Access Code: [email or web address] URL: [email or web address] Date: 12/10/2021 Prepared by: Rivka Barbara  Exercises - Isometric Shoulder Extension at [place]  - 1 x daily - 7 x weekly - 2 sets - 10 reps - 5 second hold - Isometric Shoulder External Rotation at Wall  - 1  x daily - 7 x weekly - 2 sets - 10 reps - 5 second  hold - Standing Isometric Shoulder Internal Rotation at Doorway  - 1 x daily - 7 x weekly - 2 sets - 10 reps - 5 second  hold - Isometric Shoulder Abduction at Wall  - 1 x daily - 7 x weekly - 2 sets - 10 reps - 5 second  hold - Standing Shoulder Flexion to 90 Degrees  - 1 x daily - 7 x weekly - 1 sets - 10 reps  Access Code: [email or web address] URL: [email or web address] Date: 12/04/2021 Prepared by: Rivka Barbara  Exercises - Circular Shoulder Pendulum with Table Support  - 2-3 x daily - 7 x weekly - 20 reps - Flexion-Extension Shoulder Pendulum with Table Support  - 2-3 x daily - 7 x weekly - 1 sets - 20 reps - Horizontal Shoulder Pendulum with Table Support  - 2-3 x daily - 7 x weekly - 20 reps - Seated Shoulder Abduction Towel Slide at Table Top  - 2-3 x daily - 7 x weekly - 1 sets - 15 reps - 5 second hold  ASSESSMENT:  CLINICAL IMPRESSION:  ***  Continued with current plan of care as laid out in evaluation and recent prior sessions. Pt remains motivated to advance progress toward goals in order to maximize independence and safety at home. Pt requires high level assistance and cuing for completion of exercises in order to provide adequate level of stimulation challenge while minimizing pain and discomfort when possible. Pt closely monitored throughout session pt response and to maximize patient safety during interventions.  Patient progressing well with all tasks with interventions performed this date as well as with her ability to perform activities within her environment at work.  Pt continues to demonstrate progress toward goals AEB progression of interventions this date either in volume or intensity. Pt will continue to benefit from skilled physical therapy intervention to address impairments, improve QOL, and attain therapy goals.       OBJECTIVE IMPAIRMENTS decreased ROM,  decreased strength, impaired UE functional use,  and pain.   ACTIVITY LIMITATIONS carrying, lifting, bed mobility, dressing, self feeding, and reach over head  PARTICIPATION LIMITATIONS: meal prep, cleaning, laundry, driving, shopping, community activity, occupation, and yard work  PERSONAL FACTORS Profession are also affecting patient's functional outcome.   REHAB POTENTIAL: Good  CLINICAL DECISION MAKING: Stable/uncomplicated  EVALUATION COMPLEXITY: Low   GOALS: Goals reviewed with patient? Yes  SHORT TERM GOALS: Target date: 13/04/2021       Patient will be independent in home exercise program to improve strength/mobility for better functional independence with ADLs. Baseline: No HEP currently  Goal status: MET   LONG TERM GOALS: Target date: 03/10/2022     Pt will report resting pain levels 1/10 or less in order to indicate improvement in subjective levels of pain. Baseline: 3/10 resting, 6-7/10 with movement 11/15: 2-3/10 soreness at worst at the moment Goal status: IN PROGRESS  2.  Patient will improve Foto score to 61 or greater in order to indicate improved subjective rating of upper extremity functional use Baseline: 46 11/15: 67 Goal status: MET  3.  Patient will improve left shoulder active range of motion to 160 degrees flexion, and abduction, and 80 degrees of external and internal rotation in abducted shoulder position in order to improve patient's ability to perform functional activities. Baseline: See evaluation range of motion chart 01/13/22: 161 flexion, 151 abduction, full ROM on L UE in ER and IR in 90 degrees of abduction  Goal status: IN PROGRESS  4.  Patient will improve left shoulder strength in all major movements planes including abduction, scaption, flexion, external and internal rotation to 4+ out of 5 or greater in order to indicate improved shoulder strength for functional activities Baseline: Not assessed that he fell secondary to recency of surgical procedure 11/15: flexion  left 4+ abduction  and flexion, ER 4+, IR 4+, some pain noted with abduction and ER combined  Goal status: IN PROGRESS  5.  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 Baseline: Unable to perform at this time without pain, 11/15: pt lifting/pushing/ pulling with 10lb weight limit and has some soreness but no pain following day  Goal status: IN PROGRESS   PLAN: PT FREQUENCY: 1-2x/week  PT DURATION: 8 weeks  PLANNED INTERVENTIONS: Therapeutic exercises, Therapeutic activity, Neuromuscular re-education, Balance training, Gait training, Patient/Family education, Self Care, Joint mobilization, and Manual therapy  PLAN FOR NEXT SESSION:  *** Therex/theract for progression to nursing activities.   Gwenlyn Saran, PT, DPT Physical Therapist- [facility]  02/02/22, 5:47 PM

## 2022-02-03 ENCOUNTER — Ambulatory Visit: Payer: PRIVATE HEALTH INSURANCE

## 2022-02-05 ENCOUNTER — Ambulatory Visit: Payer: PRIVATE HEALTH INSURANCE | Attending: Family Medicine

## 2022-02-05 DIAGNOSIS — M25612 Stiffness of left shoulder, not elsewhere classified: Secondary | ICD-10-CM | POA: Diagnosis present

## 2022-02-05 DIAGNOSIS — M6281 Muscle weakness (generalized): Secondary | ICD-10-CM

## 2022-02-05 DIAGNOSIS — M25512 Pain in left shoulder: Secondary | ICD-10-CM | POA: Diagnosis present

## 2022-02-05 DIAGNOSIS — R262 Difficulty in walking, not elsewhere classified: Secondary | ICD-10-CM

## 2022-02-05 NOTE — Therapy (Addendum)
OUTPATIENT PHYSICAL THERAPY TREATMENT/RE-CERT  Patient Name: April Graham MRN: [id number] DOB:Jul 25, 1980, 41 y.o., female Today's Date: 02/05/2022   PT End of Session - 02/05/22 0851     Visit Number 14    Number of Visits 21    Date for PT Re-Evaluation 01/29/22    Authorization Type WC    Authorization Time Period WC: 10/6-11/13 (10 treats, one eval in this time frame)    Authorization - Number of Visits 10    Progress Note Due on Visit 20    PT Start Time [phone]    PT Stop Time 0930    PT Time Calculation (min) 44 min    Activity Tolerance Patient tolerated treatment well;Patient limited by pain    Behavior During Therapy WFL for tasks assessed/performed               Past Medical History:  Diagnosis Date   Anxiety    Arthritis    Family history of adverse reaction to anesthesia    Mother N&V   Irritable bowel syndrome (IBS)    Palpitations    PONV (postoperative nausea and vomiting)    Past Surgical History:  Procedure Laterality Date   ADENOIDECTOMY     KNEE ARTHROSCOPY     KNEE ARTHROSCOPY W/ ACL RECONSTRUCTION     TONSILLECTOMY     WISDOM TOOTH EXTRACTION     There are no problems to display for this patient.   PCP: Darreld Mclean, MD  REFERRING PROVIDER: Tania Ade, MD  REFERRING DIAG: s/p L shoulder arthroscopic debridement, SAD, DCE (distal clavicle excision)  THERAPY DIAG:  Stiffness of left shoulder, not elsewhere classified  Muscle weakness (generalized)  Acute pain of left shoulder  Difficulty in walking, not elsewhere classified  Rationale for Evaluation and Treatment Rehabilitation  ONSET DATE: 11/27/21  SUBJECTIVE:                                                                                                                                                                                      SUBJECTIVE STATEMENT:   Pt reports 3/10 pain today and notes that it's been real sore over the past week.  Pt notes the  discomfort to be in the L UT region.   PERTINENT HISTORY:  Patient is 41 y.o. F who has had left shoulder pain since an injury at work.  She responded well initially to a AC joint injection but the relief was only temporary. She also had signs of impingement with rotator cuff related pain. Patient had surgical procedure for shoulder arthroscope with subacromial decompression as well as distal clavicular excision.   PAIN:  Are you having  pain? Yes, sore; 3/10.  PRECAUTIONS: None and Other: Back on floor on 12/20/21; folow up Nov 3 Dr. Has also given physical therapy for [place] (PT) to progress patient as we see appropriate with upper extremity strengthening.  WEIGHT BEARING RESTRICTIONS No  FALLS:  Has patient fallen in last 6 months? No  LIVING ENVIRONMENT: Lives with: lives with their family, lives with their partner, and boyfriend's kids live with her part time  Lives in: House/apartment Stairs: Yes: Internal: 11 steps; on right going up and External: 1 steps; none Has following equipment at home: None  OCCUPATION: Nurse in ED  PLOF: Independent  PATIENT GOALS Full functional activities for work ( pushing, pulling, lifting) UE endurance (changing IV bags)   OBJECTIVE:   PATIENT SURVEYS:  FOTO 46   TODAY'S TREATMENT: 02/05/22   TherEx:  AA/ROM Sci fit level 4.5 x 3 min ea direction seat level 7 Wall ladder flexion x 3 with 10 second holds AAROM measured: WNL 172 degrees Wall ladder abduction x 3 reps with 10 second holds  155 degrees abduction of AAROM with this measured on round 3   Standing push-ups against support beam, x10  Seated Lat Pull Downs, 22.5# on each side (45# total), bringing bar in front, x10 Standing triceps extension, 12.5# on each side (25# total), 2x10 Standing chest press, 12.5# on each side (25# total), 2x10   Manual:   Prone STM with TP release technique applied to the UT's, specifically on the L side, pt with notable relief following  conclusion of manual therapy approach    Trigger Point Dry Needling (TDN), unbilled Education performed with patient regarding potential benefit of TDN. Reviewed precautions and risks with patient. Reviewed special precautions/risks over lung fields which include pneumothorax. Reviewed signs and symptoms of pneumothorax and advised pt to go to ER immediately if these symptoms develop advise them of dry needling treatment. Extensive time spent with pt to ensure full understanding of TDN risks. Pt provided verbal consent to treatment. TDN performed to  with 0.3 x 30 single needle placements with local twitch response (LTR). Pistoning technique utilized. Improved pain-free motion following intervention.      PATIENT EDUCATION: Education details: POC Person educated: Patient Education method: Explanation Education comprehension: verbalized understanding   HOME EXERCISE PROGRAM: Access Code: [email or web address] URL: [email or web address] Date: 12/10/2021 Prepared by: Rivka Barbara  Exercises - Isometric Shoulder Extension at [place]  - 1 x daily - 7 x weekly - 2 sets - 10 reps - 5 second hold - Isometric Shoulder External Rotation at Wall  - 1 x daily - 7 x weekly - 2 sets - 10 reps - 5 second  hold - Standing Isometric Shoulder Internal Rotation at Doorway  - 1 x daily - 7 x weekly - 2 sets - 10 reps - 5 second  hold - Isometric Shoulder Abduction at Wall  - 1 x daily - 7 x weekly - 2 sets - 10 reps - 5 second  hold - Standing Shoulder Flexion to 90 Degrees  - 1 x daily - 7 x weekly - 1 sets - 10 reps  Access Code: [email or web address] URL: [email or web address] Date: 12/04/2021 Prepared by: Rivka Barbara  Exercises - Circular Shoulder Pendulum with Table Support  - 2-3 x daily - 7 x weekly - 20 reps - Flexion-Extension Shoulder Pendulum with Table Support  - 2-3 x daily - 7 x weekly - 1 sets - 20 reps - Horizontal Shoulder Pendulum with Table Support  - 2-3 x daily -  7 x  weekly - 20 reps - Seated Shoulder Abduction Towel Slide at Table Top  - 2-3 x daily - 7 x weekly - 1 sets - 15 reps - 5 second hold  ASSESSMENT:  CLINICAL IMPRESSION:  Pt put forth great effort throughout the session and noted a considerable amount of reduction in the pain of the UT upon completion of the session (1/0 vs 3/10 upon arrival).  Pt is making significant progress towards goals with therapy, only limited by soreness and TP's noted in the UT at this time.   Pt will continue to benefit from skilled therapy to address remaining deficits in order to improve overall QoL and return to PLOF.         OBJECTIVE IMPAIRMENTS decreased ROM, decreased strength, impaired UE functional use, and pain.   ACTIVITY LIMITATIONS carrying, lifting, bed mobility, dressing, self feeding, and reach over head  PARTICIPATION LIMITATIONS: meal prep, cleaning, laundry, driving, shopping, community activity, occupation, and yard work  PERSONAL FACTORS Profession are also affecting patient's functional outcome.   REHAB POTENTIAL: Good  CLINICAL DECISION MAKING: Stable/uncomplicated  EVALUATION COMPLEXITY: Low   GOALS: Goals reviewed with patient? Yes  SHORT TERM GOALS: Target date: 13/04/2021     Patient will be independent in home exercise program to improve strength/mobility for better functional independence with ADLs. Baseline: No HEP currently  Goal status: MET    LONG TERM GOALS: Target date: 03/12/2022  Pt will report resting pain levels 1/10 or less in order to indicate improvement in subjective levels of pain. Baseline: 3/10 resting, 6-7/10 with movement 11/15: 2-3/10 soreness at worst at the moment Goal status: IN PROGRESS  2.  Patient will improve Foto score to 61 or greater in order to indicate improved subjective rating of upper extremity functional use Baseline: 46 11/15: 67 Goal status: MET  3.  Patient will improve left shoulder active range of motion to 160 degrees  flexion, and abduction, and 80 degrees of external and internal rotation in abducted shoulder position in order to improve patient's ability to perform functional activities. Baseline: See evaluation range of motion chart 01/13/22: 161 flexion, 151 abduction, full ROM on L UE in ER and IR in 90 degrees of abduction  Goal status: IN PROGRESS  4.  Patient will improve left shoulder strength in all major movements planes including abduction, scaption, flexion, external and internal rotation to 4+ out of 5 or greater in order to indicate improved shoulder strength for functional activities Baseline: Not assessed that he fell secondary to recency of surgical procedure 11/15: flexion  left 4+ abduction and flexion, ER 4+, IR 4+, some pain noted with abduction and ER combined  Goal status: IN PROGRESS  5.  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 Baseline: Unable to perform at this time without pain, 11/15: pt lifting/pushing/ pulling with 10lb weight limit and has some soreness but no pain following day  Goal status: IN PROGRESS  6.  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   Baseline:  Pt unable to complete shift at this time. Goal status: IN PROGRESS   PLAN: PT FREQUENCY: 1-2x/week  PT DURATION: 8 weeks  PLANNED INTERVENTIONS: Therapeutic exercises, Therapeutic activity, Neuromuscular re-education, Balance training, Gait training, Patient/Family education, Self Care, Joint mobilization, and Manual therapy  PLAN FOR NEXT  SESSION:   Therex/theract for progression to nursing activities.   Gwenlyn Saran, PT, DPT Physical Therapist- [facility]  02/05/22, 11:16 AM

## 2022-02-08 ENCOUNTER — Ambulatory Visit: Payer: PRIVATE HEALTH INSURANCE

## 2022-02-11 ENCOUNTER — Ambulatory Visit: Payer: PRIVATE HEALTH INSURANCE

## 2022-02-11 DIAGNOSIS — M25612 Stiffness of left shoulder, not elsewhere classified: Secondary | ICD-10-CM | POA: Diagnosis not present

## 2022-02-11 DIAGNOSIS — M6281 Muscle weakness (generalized): Secondary | ICD-10-CM

## 2022-02-11 DIAGNOSIS — M25512 Pain in left shoulder: Secondary | ICD-10-CM

## 2022-02-11 DIAGNOSIS — R262 Difficulty in walking, not elsewhere classified: Secondary | ICD-10-CM

## 2022-02-11 NOTE — Therapy (Addendum)
OUTPATIENT PHYSICAL THERAPY TREATMENT   Patient Name: April Graham MRN: [id number] DOB:12-12-1980, 41 y.o., female Today's Date: 02/11/2022   PT End of Session - 02/11/22 1345     Visit Number 15    Number of Visits 21    Date for PT Re-Evaluation 03/12/22    Authorization Type WC    Authorization Time Period WC: 10/6-11/13 (10 treats, one eval in this time frame);    Authorization - Number of Visits 10    Progress Note Due on Visit 20    PT Start Time 1607    PT Stop Time 1429    PT Time Calculation (min) 44 min    Activity Tolerance Patient tolerated treatment well;Patient limited by pain    Behavior During Therapy WFL for tasks assessed/performed               Past Medical History:  Diagnosis Date   Anxiety    Arthritis    Family history of adverse reaction to anesthesia    Mother N&V   Irritable bowel syndrome (IBS)    Palpitations    PONV (postoperative nausea and vomiting)    Past Surgical History:  Procedure Laterality Date   ADENOIDECTOMY     KNEE ARTHROSCOPY     KNEE ARTHROSCOPY W/ ACL RECONSTRUCTION     TONSILLECTOMY     WISDOM TOOTH EXTRACTION     There are no problems to display for this patient.   PCP: April Mclean, MD  REFERRING PROVIDER: Tania Ade, MD  REFERRING DIAG: s/p L shoulder arthroscopic debridement, SAD, DCE (distal clavicle excision)  THERAPY DIAG:  Stiffness of left shoulder, not elsewhere classified  Muscle weakness (generalized)  Acute pain of left shoulder  Difficulty in walking, not elsewhere classified  Rationale for Evaluation and Treatment Rehabilitation  ONSET DATE: 11/27/21  SUBJECTIVE:                                                                                                                                                                                      SUBJECTIVE STATEMENT:   Tomorrow is her follow up with physician about discussion of restrictions. Has had the flu so has missed some  time.    PERTINENT HISTORY:  Patient is 41 y.o. F who has had left shoulder pain since an injury at work.  She responded well initially to a AC joint injection but the relief was only temporary. She also had signs of impingement with rotator cuff related pain. Patient had surgical procedure for shoulder arthroscope with subacromial decompression as well as distal clavicular excision.   PAIN:  Are you having pain? Yes, sore; 3/10.  PRECAUTIONS:  None and Other: Back on floor on 12/20/21; folow up Nov 3 Dr. Has also given physical therapy for [place] (PT) to progress patient as we see appropriate with upper extremity strengthening.  WEIGHT BEARING RESTRICTIONS No  FALLS:  Has patient fallen in last 6 months? No  LIVING ENVIRONMENT: Lives with: lives with their family, lives with their partner, and boyfriend's kids live with her part time  Lives in: House/apartment Stairs: Yes: Internal: 11 steps; on right going up and External: 1 steps; none Has following equipment at home: None  OCCUPATION: Nurse in ED  PLOF: Independent  PATIENT GOALS Full functional activities for work ( pushing, pulling, lifting) UE endurance (changing IV bags)   OBJECTIVE:   PATIENT SURVEYS:  FOTO 46   TODAY'S TREATMENT: 02/11/22  GOALS: see below for details.   TherEx: Review HEP: see below for details:  Patient tolerates without use of weight due to restrictions continuing to be in place: performed arm movements to demonstrate exercise technique.     PATIENT EDUCATION: Education details: POC Person educated: Patient Education method: Explanation Education comprehension: verbalized understanding   HOME EXERCISE PROGRAM: Access Code: [email or web address] URL: [email or web address] Date: 02/11/2022 Prepared by: Janna Arch  Exercises - Seated Scapular Retraction  - 1 x daily - 7 x weekly - 2 sets - 10 reps - 5 hold - Seated Upper Trapezius Stretch  - 1 x daily - 7 x weekly - 2 sets - 2 reps  - 30 hold - Seated Thoracic Lumbar Extension  - 1 x daily - 7 x weekly - 2 sets - 10 reps - 5 hold - Standing 'L' Stretch at Counter  - 1 x daily - 7 x weekly - 2 sets - 2 reps - 30 hold - Standing Bicep Curls Supinated with Dumbbells  - 1 x daily - 7 x weekly - 2 sets - 10 reps - 5 hold - Seated Bicep Curls Neutral with Dumbbells  - 1 x daily - 7 x weekly - 2 sets - 10 reps - 5 hold - Seated Triceps Extension with Dumbbell Single Arm  - 1 x daily - 7 x weekly - 2 sets - 10 reps - 5 hold - Kneeling Plank with Shoulder Row and Dumbbells  - 1 x daily - 7 x weekly - 2 sets - 10 reps - 5 hold - Seated Bent Over Shoulder Row with Dumbbells  - 1 x daily - 7 x weekly - 2 sets - 10 reps - 5 hold - Seated Single Arm Shoulder Abduction with Elbow Bent and Dumbbell  - 1 x daily - 7 x weekly - 2 sets - 10 reps - 5 hold - Shoulder Overhead Press in Abduction with Dumbbells  - 1 x daily - 7 x weekly - 2 sets - 10 reps - 5 hold - Push Up on Table  - 1 x daily - 7 x weekly - 2 sets - 10 reps - 5 hold - Forearm Plank on Wall  - 1 x daily - 7 x weekly - 2 sets - 2 reps - 30 hold  Access Code: [email or web address] URL: [email or web address] Date: 12/10/2021 Prepared by: Rivka Barbara  Exercises - Isometric Shoulder Extension at [place]  - 1 x daily - 7 x weekly - 2 sets - 10 reps - 5 second hold - Isometric Shoulder External Rotation at Wall  - 1 x daily - 7 x weekly - 2 sets - 10 reps - 5 second  hold - Standing Isometric Shoulder Internal  Rotation at Doorway  - 1 x daily - 7 x weekly - 2 sets - 10 reps - 5 second  hold - Isometric Shoulder Abduction at Wall  - 1 x daily - 7 x weekly - 2 sets - 10 reps - 5 second  hold - Standing Shoulder Flexion to 90 Degrees  - 1 x daily - 7 x weekly - 1 sets - 10 reps  Access Code: [email or web address] URL: [email or web address] Date: 12/04/2021 Prepared by: Rivka Barbara  Exercises - Circular Shoulder Pendulum with Table Support  - 2-3 x daily - 7 x weekly -  20 reps - Flexion-Extension Shoulder Pendulum with Table Support  - 2-3 x daily - 7 x weekly - 1 sets - 20 reps - Horizontal Shoulder Pendulum with Table Support  - 2-3 x daily - 7 x weekly - 20 reps - Seated Shoulder Abduction Towel Slide at Table Top  - 2-3 x daily - 7 x weekly - 1 sets - 15 reps - 5 second hold  ASSESSMENT:  CLINICAL IMPRESSION:  This may be patient's last session pending workers comp. Patient educated on HEP demonstrated understanding. She is highly motivated throughout session and goals addressed. Continued weakness of L shoulder noted however significant improvements s/p surgery noted in comparison to previous dates. She has decreased pain however is still under restrictions and has not returned to work full time. Awaiting furhter information on restrictions prior to discharging patient due to patient unsure of workers comp coverage.  Pt will continue to benefit from skilled therapy to address remaining deficits in order to improve overall QoL and return to PLOF.         OBJECTIVE IMPAIRMENTS decreased ROM, decreased strength, impaired UE functional use, and pain.   ACTIVITY LIMITATIONS carrying, lifting, bed mobility, dressing, self feeding, and reach over head  PARTICIPATION LIMITATIONS: meal prep, cleaning, laundry, driving, shopping, community activity, occupation, and yard work  PERSONAL FACTORS Profession are also affecting patient's functional outcome.   REHAB POTENTIAL: Good  CLINICAL DECISION MAKING: Stable/uncomplicated  EVALUATION COMPLEXITY: Low   GOALS: Goals reviewed with patient? Yes  SHORT TERM GOALS: Target date: 13/04/2021       Patient will be independent in home exercise program to improve strength/mobility for better functional independence with ADLs. Baseline: No HEP currently  Goal status: MET   LONG TERM GOALS: Target date: 03/10/2022     Pt will report resting pain levels 1/10 or less in order to indicate improvement in  subjective levels of pain. Baseline: 3/10 resting, 6-7/10 with movement 11/15: 2-3/10 soreness at worst at the moment 12/14: resting 0/10 worst pain: 3/10  Goal status: IN PROGRESS  2.  Patient will improve Foto score to 61 or greater in order to indicate improved subjective rating of upper extremity functional use Baseline: 46 11/15: 67 Goal status: MET  3.  Patient will improve left shoulder active range of motion to 160 degrees flexion, and abduction, and 80 degrees of external and internal rotation in abducted shoulder position in order to improve patient's ability to perform functional activities. Baseline: See evaluation range of motion chart 01/13/22: 161 flexion, 151 abduction, full ROM on L UE in ER and IR in 90 degrees of abduction ; 12/14: flexion and abduction >160 IR/ER WFL Goal status: MET  4.  Patient will improve left shoulder strength in all major movements planes including abduction, scaption, flexion, external and internal rotation to 4+ out of 5 or greater in order to indicate improved shoulder strength for  functional activities Baseline: Not assessed that he fell secondary to recency of surgical procedure 11/15: flexion  left 4+ abduction and flexion, ER 4+, IR 4+, some pain noted with abduction and ER combined 12/14: L flexion 5, abduction 4+/5 IR/ ER 4+ Goal status: IN PROGRESS  5.  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 Baseline: Unable to perform at this time without pain, 11/15: pt lifting/pushing/ pulling with 10lb weight limit and has some soreness but no pain following day 12/14: still under restrictions; returned to work  Goal status: IN PROGRESS   PLAN: PT FREQUENCY: 1-2x/week  PT DURATION: 8 weeks  PLANNED INTERVENTIONS: Therapeutic exercises, Therapeutic activity, Neuromuscular re-education, Balance training, Gait training, Patient/Family  education, Self Care, Joint mobilization, and Manual therapy  PLAN FOR NEXT SESSION:   Therex/theract for progression to nursing activities.   Janna Arch PT  Physical Therapist- [facility]  02/11/22, 2:40 PM

## 2022-02-12 NOTE — Addendum Note (Signed)
Addended by: Phineas Real on: 02/12/2022 11:45 AM   Modules accepted: Orders

## 2022-02-15 ENCOUNTER — Encounter: Payer: Self-pay | Admitting: Physical Therapy

## 2022-02-15 ENCOUNTER — Ambulatory Visit: Payer: PRIVATE HEALTH INSURANCE | Admitting: Physical Therapy

## 2022-02-15 DIAGNOSIS — M6281 Muscle weakness (generalized): Secondary | ICD-10-CM

## 2022-02-15 DIAGNOSIS — M25612 Stiffness of left shoulder, not elsewhere classified: Secondary | ICD-10-CM | POA: Diagnosis not present

## 2022-02-15 DIAGNOSIS — M25512 Pain in left shoulder: Secondary | ICD-10-CM

## 2022-02-15 NOTE — Therapy (Signed)
OUTPATIENT PHYSICAL THERAPY TREATMENT   Patient Name: April Graham MRN: [id number] DOB:1981-02-17, 41 y.o., female Today's Date: 02/15/2022   PT End of Session - 02/15/22 1000     Visit Number 16    Number of Visits 21    Date for PT Re-Evaluation 03/12/22    Authorization Type WC    Authorization Time Period WC: 10/6-11/13 (10 treats, one eval in this time frame);    Authorization - Number of Visits 10    Progress Note Due on Visit 20    Activity Tolerance Patient tolerated treatment well;Patient limited by pain    Behavior During Therapy WFL for tasks assessed/performed                Past Medical History:  Diagnosis Date   Anxiety    Arthritis    Family history of adverse reaction to anesthesia    Mother N&V   Irritable bowel syndrome (IBS)    Palpitations    PONV (postoperative nausea and vomiting)    Past Surgical History:  Procedure Laterality Date   ADENOIDECTOMY     KNEE ARTHROSCOPY     KNEE ARTHROSCOPY W/ ACL RECONSTRUCTION     TONSILLECTOMY     WISDOM TOOTH EXTRACTION     There are no problems to display for this patient.   PCP: Copland, Gay Filler, MD  REFERRING PROVIDER: Tania Ade, MD  REFERRING DIAG: s/p L shoulder arthroscopic debridement, SAD, DCE (distal clavicle excision)  THERAPY DIAG:  Acute pain of left shoulder  Muscle weakness (generalized)  Stiffness of left shoulder, not elsewhere classified  Rationale for Evaluation and Treatment Rehabilitation  ONSET DATE: 11/27/21  SUBJECTIVE:                                                                                                                                                                                      SUBJECTIVE STATEMENT:   Pt reports when seeing her MD they are happy with her progress. Her MD reports she feels she continues to need PT services but is now under restrictions for no pushing, pulling, lifting greater than 20#. Pt will be going back to work 12 hr  shifts but not consecutive shifts. Pt is now cleared for overhead work.   Return to MD: 03/19/22 with goal of return to normal work duty at that time.   PERTINENT HISTORY:  Patient is 41 y.o. F who has had left shoulder pain since an injury at work.  She responded well initially to a AC joint injection but the relief was only temporary. She also had signs of impingement with rotator cuff related pain. Patient had surgical procedure for  shoulder arthroscope with subacromial decompression as well as distal clavicular excision.   PAIN:  Are you having pain? No 0/10  PRECAUTIONS: None and Other: Back on floor on 12/20/21; folow up Nov 3 Dr. Has also given physical therapy for [place] (PT) to progress patient as we see appropriate with upper extremity strengthening.  WEIGHT BEARING RESTRICTIONS No  FALLS:  Has patient fallen in last 6 months? No  LIVING ENVIRONMENT: Lives with: lives with their family, lives with their partner, and boyfriend's kids live with her part time  Lives in: House/apartment Stairs: Yes: Internal: 11 steps; on right going up and External: 1 steps; none Has following equipment at home: None  OCCUPATION: Nurse in ED  PLOF: Independent  PATIENT GOALS Full functional activities for work ( pushing, pulling, lifting) UE endurance (changing IV bags)   OBJECTIVE:   PATIENT SURVEYS:  FOTO 46   TODAY'S TREATMENT: 02/15/22   TherEx:  AA/ROM Sci fit level 4.5 x 3 min ea direction seat level 7 Wall ladder flexion x 1 with 30 second holds for AAROM stretch  Wall ladder abduction x 1*30 sec for AAROM stretch     Standing push-ups against support beam, 2 x 10  Seated Lat Pull Downs, 22.5# on each side (45# total), bringing bar in front, 3x12 Standing chest press, 12.5# on each side (25# total), 2x12  Eccentric SL ER  10 reps with 4 #  8 reps with 5 # 6 reps with 5 #   D1 shoulder flexion supine  2 X 10 reps      PATIENT EDUCATION: Education details:  POC Person educated: Patient Education method: Explanation Education comprehension: verbalized understanding   HOME EXERCISE PROGRAM: Access Code: [email or web address] URL: [email or web address] Date: 02/11/2022 Prepared by: Janna Arch  Exercises - Seated Scapular Retraction  - 1 x daily - 7 x weekly - 2 sets - 10 reps - 5 hold - Seated Upper Trapezius Stretch  - 1 x daily - 7 x weekly - 2 sets - 2 reps - 30 hold - Seated Thoracic Lumbar Extension  - 1 x daily - 7 x weekly - 2 sets - 10 reps - 5 hold - Standing 'L' Stretch at Counter  - 1 x daily - 7 x weekly - 2 sets - 2 reps - 30 hold - Standing Bicep Curls Supinated with Dumbbells  - 1 x daily - 7 x weekly - 2 sets - 10 reps - 5 hold - Seated Bicep Curls Neutral with Dumbbells  - 1 x daily - 7 x weekly - 2 sets - 10 reps - 5 hold - Seated Triceps Extension with Dumbbell Single Arm  - 1 x daily - 7 x weekly - 2 sets - 10 reps - 5 hold - Kneeling Plank with Shoulder Row and Dumbbells  - 1 x daily - 7 x weekly - 2 sets - 10 reps - 5 hold - Seated Bent Over Shoulder Row with Dumbbells  - 1 x daily - 7 x weekly - 2 sets - 10 reps - 5 hold - Seated Single Arm Shoulder Abduction with Elbow Bent and Dumbbell  - 1 x daily - 7 x weekly - 2 sets - 10 reps - 5 hold - Shoulder Overhead Press in Abduction with Dumbbells  - 1 x daily - 7 x weekly - 2 sets - 10 reps - 5 hold - Push Up on Table  - 1 x daily - 7 x weekly - 2 sets - 10 reps -  5 hold - Forearm Plank on Wall  - 1 x daily - 7 x weekly - 2 sets - 2 reps - 30 hold  Access Code: [email or web address] URL: [email or web address] Date: 12/10/2021 Prepared by: Rivka Barbara  Exercises - Isometric Shoulder Extension at [place]  - 1 x daily - 7 x weekly - 2 sets - 10 reps - 5 second hold - Isometric Shoulder External Rotation at Wall  - 1 x daily - 7 x weekly - 2 sets - 10 reps - 5 second  hold - Standing Isometric Shoulder Internal Rotation at Doorway  - 1 x daily - 7 x weekly - 2 sets  - 10 reps - 5 second  hold - Isometric Shoulder Abduction at Wall  - 1 x daily - 7 x weekly - 2 sets - 10 reps - 5 second  hold - Standing Shoulder Flexion to 90 Degrees  - 1 x daily - 7 x weekly - 1 sets - 10 reps  Access Code: [email or web address] URL: [email or web address] Date: 12/04/2021 Prepared by: Rivka Barbara  Exercises - Circular Shoulder Pendulum with Table Support  - 2-3 x daily - 7 x weekly - 20 reps - Flexion-Extension Shoulder Pendulum with Table Support  - 2-3 x daily - 7 x weekly - 1 sets - 20 reps - Horizontal Shoulder Pendulum with Table Support  - 2-3 x daily - 7 x weekly - 20 reps - Seated Shoulder Abduction Towel Slide at Table Top  - 2-3 x daily - 7 x weekly - 1 sets - 15 reps - 5 second hold  ASSESSMENT:  CLINICAL IMPRESSION:   Continued with current plan of care as laid out in evaluation and recent prior sessions. Pt remains motivated to advance progress toward goals in order to maximize independence and safety at home. Pt progressed with UE strength and endurance throughout session with good response. Worked on ER strength specifically with good results. Pt closely monitored throughout session pt response and to maximize patient safety during interventions. Pt continues to demonstrate progress toward goals AEB progression of interventions this date either in volume or intensity.   OBJECTIVE IMPAIRMENTS decreased ROM, decreased strength, impaired UE functional use, and pain.   ACTIVITY LIMITATIONS carrying, lifting, bed mobility, dressing, self feeding, and reach over head  PARTICIPATION LIMITATIONS: meal prep, cleaning, laundry, driving, shopping, community activity, occupation, and yard work  PERSONAL FACTORS Profession are also affecting patient's functional outcome.   REHAB POTENTIAL: Good  CLINICAL DECISION MAKING: Stable/uncomplicated  EVALUATION COMPLEXITY: Low   GOALS: Goals reviewed with patient? Yes  SHORT TERM GOALS: Target date:  13/04/2021       Patient will be independent in home exercise program to improve strength/mobility for better functional independence with ADLs. Baseline: No HEP currently  Goal status: MET   LONG TERM GOALS: Target date: 03/10/2022     Pt will report resting pain levels 1/10 or less in order to indicate improvement in subjective levels of pain. Baseline: 3/10 resting, 6-7/10 with movement 11/15: 2-3/10 soreness at worst at the moment 12/14: resting 0/10 worst pain: 3/10  Goal status: IN PROGRESS  2.  Patient will improve Foto score to 61 or greater in order to indicate improved subjective rating of upper extremity functional use Baseline: 46 11/15: 67 Goal status: MET  3.  Patient will improve left shoulder active range of motion to 160 degrees flexion, and abduction, and 80 degrees of external and internal rotation in abducted shoulder position in order to improve patient's ability to  perform functional activities. Baseline: See evaluation range of motion chart 01/13/22: 161 flexion, 151 abduction, full ROM on L UE in ER and IR in 90 degrees of abduction ; 12/14: flexion and abduction >160 IR/ER WFL Goal status: MET  4.  Patient will improve left shoulder strength in all major movements planes including abduction, scaption, flexion, external and internal rotation to 4+ out of 5 or greater in order to indicate improved shoulder strength for functional activities Baseline: Not assessed that he fell secondary to recency of surgical procedure 11/15: flexion  left 4+ abduction and flexion, ER 4+, IR 4+, some pain noted with abduction and ER combined 12/14: L flexion 5, abduction 4+/5 IR/ ER 4+ Goal status: IN PROGRESS  5.  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 Baseline: Unable to perform at this time without pain, 11/15: pt lifting/pushing/ pulling with 10lb weight limit and  has some soreness but no pain following day 12/14: still under restrictions; returned to work  Goal status: IN PROGRESS   PLAN: PT FREQUENCY: 1-2x/week  PT DURATION: 8 weeks  PLANNED INTERVENTIONS: Therapeutic exercises, Therapeutic activity, Neuromuscular re-education, Balance training, Gait training, Patient/Family education, Self Care, Joint mobilization, and Manual therapy  PLAN FOR NEXT SESSION:   Therex/theract for progression to nursing activities.   [facility]  02/15/22, 2:16 PM

## 2022-02-16 ENCOUNTER — Ambulatory Visit: Payer: PRIVATE HEALTH INSURANCE

## 2022-02-18 ENCOUNTER — Ambulatory Visit: Payer: PRIVATE HEALTH INSURANCE

## 2022-02-18 ENCOUNTER — Encounter: Payer: Self-pay | Admitting: Physical Therapy

## 2022-02-18 ENCOUNTER — Ambulatory Visit: Payer: PRIVATE HEALTH INSURANCE | Admitting: Physical Therapy

## 2022-02-18 DIAGNOSIS — M25612 Stiffness of left shoulder, not elsewhere classified: Secondary | ICD-10-CM

## 2022-02-18 DIAGNOSIS — M6281 Muscle weakness (generalized): Secondary | ICD-10-CM

## 2022-02-18 DIAGNOSIS — M25512 Pain in left shoulder: Secondary | ICD-10-CM

## 2022-02-18 NOTE — Therapy (Signed)
OUTPATIENT PHYSICAL THERAPY TREATMENT   Patient Name: April Graham MRN: [id number] DOB:22-Feb-1981, 41 y.o., female Today's Date: 02/18/2022   PT End of Session - 02/18/22 0935     Visit Number 17    Number of Visits 21    Date for PT Re-Evaluation 03/12/22    Authorization Type WC    Authorization Time Period WC: 10/6-11/13 (10 treats, one eval in this time frame);    Authorization - Visit Number 2    Authorization - Number of Visits --    Progress Note Due on Visit 20    PT Start Time 0932    PT Stop Time 1013    PT Time Calculation (min) 41 min    Activity Tolerance Patient tolerated treatment well    Behavior During Therapy WFL for tasks assessed/performed                 Past Medical History:  Diagnosis Date   Anxiety    Arthritis    Family history of adverse reaction to anesthesia    Mother N&V   Irritable bowel syndrome (IBS)    Palpitations    PONV (postoperative nausea and vomiting)    Past Surgical History:  Procedure Laterality Date   ADENOIDECTOMY     KNEE ARTHROSCOPY     KNEE ARTHROSCOPY W/ ACL RECONSTRUCTION     TONSILLECTOMY     WISDOM TOOTH EXTRACTION     There are no problems to display for this patient.   PCP: Copland, Gay Filler, MD  REFERRING PROVIDER: Tania Ade, MD  REFERRING DIAG: s/p L shoulder arthroscopic debridement, SAD, DCE (distal clavicle excision)  THERAPY DIAG:  Acute pain of left shoulder  Muscle weakness (generalized)  Stiffness of left shoulder, not elsewhere classified  Rationale for Evaluation and Treatment Rehabilitation  ONSET DATE: 11/27/21  SUBJECTIVE:                                                                                                                                                                                      SUBJECTIVE STATEMENT:   Pt reports she has been doing well. Has more soreness from previous session and from work mostly in the anterior aspect of her left shoulder.    Return to MD: 03/19/22 with goal of return to normal work duty at that time.   PERTINENT HISTORY:  Patient is 41 y.o. F who has had left shoulder pain since an injury at work.  She responded well initially to a AC joint injection but the relief was only temporary. She also had signs of impingement with rotator cuff related pain. Patient had surgical procedure for shoulder arthroscope with  subacromial decompression as well as distal clavicular excision.   PAIN:  Are you having pain? No 2/10  PRECAUTIONS: None and Other: Back on floor on 12/20/21; folow up Nov 3 Dr. Has also given physical therapy for [place] (PT) to progress patient as we see appropriate with upper extremity strengthening.  WEIGHT BEARING RESTRICTIONS No  FALLS:  Has patient fallen in last 6 months? No  LIVING ENVIRONMENT: Lives with: lives with their family, lives with their partner, and boyfriend's kids live with her part time  Lives in: House/apartment Stairs: Yes: Internal: 11 steps; on right going up and External: 1 steps; none Has following equipment at home: None  OCCUPATION: Nurse in ED  PLOF: Independent  PATIENT GOALS Full functional activities for work ( pushing, pulling, lifting) UE endurance (changing IV bags)   OBJECTIVE:   PATIENT SURVEYS:  FOTO 46   TODAY'S TREATMENT: 02/18/22  AA/ROM Sci fit level 4.5 x 3 min ea direction seat level 7 Wall ladder flexion x 2 with 15 second holds for AAROM stretch  Wall ladder abduction x 2*15 sec for AAROM stretch   TherEx: Superset bent over row with 5 second hold and with seated paloff press with 3# dumbbells  -3 sets  -3 # for paloff press -4# for bent over rows first set, increased to 7# second and third sets   Superset wall plank for 30 seconds  with UE supported triceps kickback - with 3-5 # weight progressing to 5# and triceps with 5#   Long sitting modified tricep press super set with Standing push-ups against support beam, 2 x 10     PATIENT  EDUCATION: Education details: POC Person educated: Patient Education method: Explanation Education comprehension: verbalized understanding   HOME EXERCISE PROGRAM: Access Code: [email or web address] URL: [email or web address] Date: 02/11/2022 Prepared by: Janna Arch  Exercises - Seated Scapular Retraction  - 1 x daily - 7 x weekly - 2 sets - 10 reps - 5 hold - Seated Upper Trapezius Stretch  - 1 x daily - 7 x weekly - 2 sets - 2 reps - 30 hold - Seated Thoracic Lumbar Extension  - 1 x daily - 7 x weekly - 2 sets - 10 reps - 5 hold - Standing 'L' Stretch at Counter  - 1 x daily - 7 x weekly - 2 sets - 2 reps - 30 hold - Standing Bicep Curls Supinated with Dumbbells  - 1 x daily - 7 x weekly - 2 sets - 10 reps - 5 hold - Seated Bicep Curls Neutral with Dumbbells  - 1 x daily - 7 x weekly - 2 sets - 10 reps - 5 hold - Seated Triceps Extension with Dumbbell Single Arm  - 1 x daily - 7 x weekly - 2 sets - 10 reps - 5 hold - Kneeling Plank with Shoulder Row and Dumbbells  - 1 x daily - 7 x weekly - 2 sets - 10 reps - 5 hold - Seated Bent Over Shoulder Row with Dumbbells  - 1 x daily - 7 x weekly - 2 sets - 10 reps - 5 hold - Seated Single Arm Shoulder Abduction with Elbow Bent and Dumbbell  - 1 x daily - 7 x weekly - 2 sets - 10 reps - 5 hold - Shoulder Overhead Press in Abduction with Dumbbells  - 1 x daily - 7 x weekly - 2 sets - 10 reps - 5 hold - Push Up on Table  - 1 x daily - 7  x weekly - 2 sets - 10 reps - 5 hold - Forearm Plank on Wall  - 1 x daily - 7 x weekly - 2 sets - 2 reps - 30 hold  Access Code: [email or web address] URL: [email or web address] Date: 12/10/2021 Prepared by: Rivka Barbara  Exercises - Isometric Shoulder Extension at [place]  - 1 x daily - 7 x weekly - 2 sets - 10 reps - 5 second hold - Isometric Shoulder External Rotation at Wall  - 1 x daily - 7 x weekly - 2 sets - 10 reps - 5 second  hold - Standing Isometric Shoulder Internal Rotation at Doorway  - 1  x daily - 7 x weekly - 2 sets - 10 reps - 5 second  hold - Isometric Shoulder Abduction at Wall  - 1 x daily - 7 x weekly - 2 sets - 10 reps - 5 second  hold - Standing Shoulder Flexion to 90 Degrees  - 1 x daily - 7 x weekly - 1 sets - 10 reps  Access Code: [email or web address] URL: [email or web address] Date: 12/04/2021 Prepared by: Rivka Barbara  Exercises - Circular Shoulder Pendulum with Table Support  - 2-3 x daily - 7 x weekly - 20 reps - Flexion-Extension Shoulder Pendulum with Table Support  - 2-3 x daily - 7 x weekly - 1 sets - 20 reps - Horizontal Shoulder Pendulum with Table Support  - 2-3 x daily - 7 x weekly - 20 reps - Seated Shoulder Abduction Towel Slide at Table Top  - 2-3 x daily - 7 x weekly - 1 sets - 15 reps - 5 second hold  ASSESSMENT:  CLINICAL IMPRESSION:   Continued with current plan of care as laid out in evaluation and recent prior sessions. Pt remains motivated to advance progress toward goals in order to maximize independence and safety at home. Pt progressed with UE strength and endurance throughout session with good response. Pt rested form specific RC strengthening due to increased soreness int he area from last PT session still lingering. Pt closely monitored throughout session pt response and to maximize patient safety during interventions. Pt continues to demonstrate progress toward goals AEB progression of interventions this date either in volume or intensity.   OBJECTIVE IMPAIRMENTS decreased ROM, decreased strength, impaired UE functional use, and pain.   ACTIVITY LIMITATIONS carrying, lifting, bed mobility, dressing, self feeding, and reach over head  PARTICIPATION LIMITATIONS: meal prep, cleaning, laundry, driving, shopping, community activity, occupation, and yard work  PERSONAL FACTORS Profession are also affecting patient's functional outcome.   REHAB POTENTIAL: Good  CLINICAL DECISION MAKING: Stable/uncomplicated  EVALUATION  COMPLEXITY: Low   GOALS: Goals reviewed with patient? Yes  SHORT TERM GOALS: Target date: 13/04/2021       Patient will be independent in home exercise program to improve strength/mobility for better functional independence with ADLs. Baseline: No HEP currently  Goal status: MET   LONG TERM GOALS: Target date: 03/10/2022     Pt will report resting pain levels 1/10 or less in order to indicate improvement in subjective levels of pain. Baseline: 3/10 resting, 6-7/10 with movement 11/15: 2-3/10 soreness at worst at the moment 12/14: resting 0/10 worst pain: 3/10  Goal status: IN PROGRESS  2.  Patient will improve Foto score to 61 or greater in order to indicate improved subjective rating of upper extremity functional use Baseline: 46 11/15: 67 Goal status: MET  3.  Patient will improve left shoulder active range of motion to 160 degrees flexion, and  abduction, and 80 degrees of external and internal rotation in abducted shoulder position in order to improve patient's ability to perform functional activities. Baseline: See evaluation range of motion chart 01/13/22: 161 flexion, 151 abduction, full ROM on L UE in ER and IR in 90 degrees of abduction ; 12/14: flexion and abduction >160 IR/ER WFL Goal status: MET  4.  Patient will improve left shoulder strength in all major movements planes including abduction, scaption, flexion, external and internal rotation to 4+ out of 5 or greater in order to indicate improved shoulder strength for functional activities Baseline: Not assessed that he fell secondary to recency of surgical procedure 11/15: flexion  left 4+ abduction and flexion, ER 4+, IR 4+, some pain noted with abduction and ER combined 12/14: L flexion 5, abduction 4+/5 IR/ ER 4+ Goal status: IN PROGRESS  5.  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 Baseline: Unable to  perform at this time without pain, 11/15: pt lifting/pushing/ pulling with 10lb weight limit and has some soreness but no pain following day 12/14: still under restrictions; returned to work  Goal status: IN PROGRESS   PLAN: PT FREQUENCY: 1-2x/week  PT DURATION: 8 weeks  PLANNED INTERVENTIONS: Therapeutic exercises, Therapeutic activity, Neuromuscular re-education, Balance training, Gait training, Patient/Family education, Self Care, Joint mobilization, and Manual therapy  PLAN FOR NEXT SESSION:   Therex/theract for progression to nursing activities.   [facility]  02/18/22, 11:31 AM

## 2022-02-24 ENCOUNTER — Ambulatory Visit: Payer: PRIVATE HEALTH INSURANCE | Attending: Orthopedic Surgery

## 2022-02-24 DIAGNOSIS — M25512 Pain in left shoulder: Secondary | ICD-10-CM | POA: Diagnosis not present

## 2022-02-24 DIAGNOSIS — M6281 Muscle weakness (generalized): Secondary | ICD-10-CM | POA: Diagnosis present

## 2022-02-24 DIAGNOSIS — R262 Difficulty in walking, not elsewhere classified: Secondary | ICD-10-CM | POA: Insufficient documentation

## 2022-02-24 DIAGNOSIS — M25612 Stiffness of left shoulder, not elsewhere classified: Secondary | ICD-10-CM | POA: Insufficient documentation

## 2022-02-24 NOTE — Therapy (Signed)
OUTPATIENT PHYSICAL THERAPY TREATMENT   Patient Name: April Graham MRN: [id number] DOB:05-23-1980, 41 y.o., female Today's Date: 02/24/2022   PT End of Session - 02/24/22 1021     Visit Number 18    Number of Visits 21    Date for PT Re-Evaluation 03/12/22    Authorization Type WC    Authorization Time Period WC: 10/6-11/13 (10 treats, one eval in this time frame);    Progress Note Due on Visit 20    PT Start Time 1019    PT Stop Time 1100    PT Time Calculation (min) 41 min    Activity Tolerance Patient tolerated treatment well    Behavior During Therapy WFL for tasks assessed/performed                 Past Medical History:  Diagnosis Date   Anxiety    Arthritis    Family history of adverse reaction to anesthesia    Mother N&V   Irritable bowel syndrome (IBS)    Palpitations    PONV (postoperative nausea and vomiting)    Past Surgical History:  Procedure Laterality Date   ADENOIDECTOMY     KNEE ARTHROSCOPY     KNEE ARTHROSCOPY W/ ACL RECONSTRUCTION     TONSILLECTOMY     WISDOM TOOTH EXTRACTION     There are no problems to display for this patient.   PCP: Darreld Mclean, MD  REFERRING PROVIDER: Tania Ade, MD  REFERRING DIAG: s/p L shoulder arthroscopic debridement, SAD, DCE (distal clavicle excision)  THERAPY DIAG:  Acute pain of left shoulder  Muscle weakness (generalized)  Stiffness of left shoulder, not elsewhere classified  Difficulty in walking, not elsewhere classified  Rationale for Evaluation and Treatment Rehabilitation  ONSET DATE: 11/27/21  SUBJECTIVE:                                                                                                                                                                                      SUBJECTIVE STATEMENT:    Pt reports she came back from [facility] yesterday and she notes that riding in the car for 6 hours aggravated her shoulder a little bit.  Pt also notes that she is working  over the next 3 days, so she is not wanting to aggravate it too much.  Return to MD: 03/19/22 with goal of return to normal work duty at that time.   PERTINENT HISTORY:  Patient is 41 y.o. F who has had left shoulder pain since an injury at work.  She responded well initially to a AC joint injection but the relief was only temporary. She also had signs of impingement  with rotator cuff related pain. Patient had surgical procedure for shoulder arthroscope with subacromial decompression as well as distal clavicular excision.   PAIN:  Are you having pain? No 2/10  PRECAUTIONS: None and Other: Back on floor on 12/20/21; folow up Nov 3 Dr. Has also given physical therapy for [place] (PT) to progress patient as we see appropriate with upper extremity strengthening.  WEIGHT BEARING RESTRICTIONS No  FALLS:  Has patient fallen in last 6 months? No  LIVING ENVIRONMENT: Lives with: lives with their family, lives with their partner, and boyfriend's kids live with her part time  Lives in: House/apartment Stairs: Yes: Internal: 11 steps; on right going up and External: 1 steps; none Has following equipment at home: None  OCCUPATION: Nurse in ED  PLOF: Independent  PATIENT GOALS Full functional activities for work ( pushing, pulling, lifting) UE endurance (changing IV bags)   OBJECTIVE:   PATIENT SURVEYS:  FOTO 46   TODAY'S TREATMENT: 02/24/22  Wall ladder flexion x 2 with 15 second holds for AAROM stretch  Wall ladder abduction x 2*15 sec for AAROM stretch   TherEx: Superset bent over row with 5 second hold and with seated paloff press with 4# dumbbells  -3 sets x15 -4# for paloff press -8# for bent over rows  Superset wall plank for 30 seconds with flexion/abduction wall circles with physioball, CW/CCW  Standing serratus foam rolls into flexion, 2x15     PATIENT EDUCATION: Education details: POC Person educated: Patient Education method: Explanation Education comprehension:  verbalized understanding   HOME EXERCISE PROGRAM: Access Code: [email or web address] URL: [email or web address] Date: 02/11/2022 Prepared by: Janna Arch  Exercises - Seated Scapular Retraction  - 1 x daily - 7 x weekly - 2 sets - 10 reps - 5 hold - Seated Upper Trapezius Stretch  - 1 x daily - 7 x weekly - 2 sets - 2 reps - 30 hold - Seated Thoracic Lumbar Extension  - 1 x daily - 7 x weekly - 2 sets - 10 reps - 5 hold - Standing 'L' Stretch at Counter  - 1 x daily - 7 x weekly - 2 sets - 2 reps - 30 hold - Standing Bicep Curls Supinated with Dumbbells  - 1 x daily - 7 x weekly - 2 sets - 10 reps - 5 hold - Seated Bicep Curls Neutral with Dumbbells  - 1 x daily - 7 x weekly - 2 sets - 10 reps - 5 hold - Seated Triceps Extension with Dumbbell Single Arm  - 1 x daily - 7 x weekly - 2 sets - 10 reps - 5 hold - Kneeling Plank with Shoulder Row and Dumbbells  - 1 x daily - 7 x weekly - 2 sets - 10 reps - 5 hold - Seated Bent Over Shoulder Row with Dumbbells  - 1 x daily - 7 x weekly - 2 sets - 10 reps - 5 hold - Seated Single Arm Shoulder Abduction with Elbow Bent and Dumbbell  - 1 x daily - 7 x weekly - 2 sets - 10 reps - 5 hold - Shoulder Overhead Press in Abduction with Dumbbells  - 1 x daily - 7 x weekly - 2 sets - 10 reps - 5 hold - Push Up on Table  - 1 x daily - 7 x weekly - 2 sets - 10 reps - 5 hold - Forearm Plank on Wall  - 1 x daily - 7 x weekly - 2 sets - 2 reps -  30 hold  Access Code: [email or web address] URL: [email or web address] Date: 12/10/2021 Prepared by: Rivka Barbara  Exercises - Isometric Shoulder Extension at Tower  - 1 x daily - 7 x weekly - 2 sets - 10 reps - 5 second hold - Isometric Shoulder External Rotation at Wall  - 1 x daily - 7 x weekly - 2 sets - 10 reps - 5 second  hold - Standing Isometric Shoulder Internal Rotation at Doorway  - 1 x daily - 7 x weekly - 2 sets - 10 reps - 5 second  hold - Isometric Shoulder Abduction at Wall  - 1 x daily - 7 x  weekly - 2 sets - 10 reps - 5 second  hold - Standing Shoulder Flexion to 90 Degrees  - 1 x daily - 7 x weekly - 1 sets - 10 reps  Access Code: [email or web address] URL: [email or web address] Date: 12/04/2021 Prepared by: Rivka Barbara  Exercises - Circular Shoulder Pendulum with Table Support  - 2-3 x daily - 7 x weekly - 20 reps - Flexion-Extension Shoulder Pendulum with Table Support  - 2-3 x daily - 7 x weekly - 1 sets - 20 reps - Horizontal Shoulder Pendulum with Table Support  - 2-3 x daily - 7 x weekly - 20 reps - Seated Shoulder Abduction Towel Slide at Table Top  - 2-3 x daily - 7 x weekly - 1 sets - 15 reps - 5 second hold  ASSESSMENT:  CLINICAL IMPRESSION:  Pt continues to perform well with tasks and puts forth great effort throughout her sessions.  Pt is making significant improvements and is able to achieve full ROM of the L shoulder today with AAROM at the ladder.  Pt also is demonstrating increased strength when given different tasks to do as well.  Pt is anticipating having to work for the next 3 days straight, and will be good test for progression of therapy.  Pt to return to therapy to assess how working the next 3 days and continue with current POC.   Pt will continue to benefit from skilled therapy to address remaining deficits in order to improve overall QoL and return to PLOF.     OBJECTIVE IMPAIRMENTS decreased ROM, decreased strength, impaired UE functional use, and pain.   ACTIVITY LIMITATIONS carrying, lifting, bed mobility, dressing, self feeding, and reach over head  PARTICIPATION LIMITATIONS: meal prep, cleaning, laundry, driving, shopping, community activity, occupation, and yard work  PERSONAL FACTORS Profession are also affecting patient's functional outcome.   REHAB POTENTIAL: Good  CLINICAL DECISION MAKING: Stable/uncomplicated  EVALUATION COMPLEXITY: Low   GOALS: Goals reviewed with patient? Yes  SHORT TERM GOALS: Target date: 13/04/2021        Patient will be independent in home exercise program to improve strength/mobility for better functional independence with ADLs. Baseline: No HEP currently  Goal status: MET   LONG TERM GOALS: Target date: 03/10/2022     Pt will report resting pain levels 1/10 or less in order to indicate improvement in subjective levels of pain. Baseline: 3/10 resting, 6-7/10 with movement 11/15: 2-3/10 soreness at worst at the moment 12/14: resting 0/10 worst pain: 3/10  Goal status: IN PROGRESS  2.  Patient will improve Foto score to 61 or greater in order to indicate improved subjective rating of upper extremity functional use Baseline: 46 11/15: 67 Goal status: MET  3.  Patient will improve left shoulder active range of motion to 160 degrees flexion, and abduction, and 80 degrees of external and internal  rotation in abducted shoulder position in order to improve patient's ability to perform functional activities. Baseline: See evaluation range of motion chart 01/13/22: 161 flexion, 151 abduction, full ROM on L UE in ER and IR in 90 degrees of abduction ; 12/14: flexion and abduction >160 IR/ER WFL Goal status: MET  4.  Patient will improve left shoulder strength in all major movements planes including abduction, scaption, flexion, external and internal rotation to 4+ out of 5 or greater in order to indicate improved shoulder strength for functional activities Baseline: Not assessed that he fell secondary to recency of surgical procedure 11/15: flexion  left 4+ abduction and flexion, ER 4+, IR 4+, some pain noted with abduction and ER combined 12/14: L flexion 5, abduction 4+/5 IR/ ER 4+ Goal status: IN PROGRESS  5.  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 Baseline: Unable to perform at this time without pain, 11/15: pt lifting/pushing/ pulling with 10lb weight limit and has some  soreness but no pain following day 12/14: still under restrictions; returned to work  Goal status: IN PROGRESS   PLAN: PT FREQUENCY: 1-2x/week  PT DURATION: 8 weeks  PLANNED INTERVENTIONS: Therapeutic exercises, Therapeutic activity, Neuromuscular re-education, Balance training, Gait training, Patient/Family education, Self Care, Joint mobilization, and Manual therapy  PLAN FOR NEXT SESSION:   Therex/theract for progression to nursing activities.    Gwenlyn Saran, PT, DPT Physical Therapist- [facility]  02/24/22, 12:55 PM

## 2022-02-27 ENCOUNTER — Encounter: Payer: Self-pay | Admitting: Family Medicine

## 2022-02-27 DIAGNOSIS — E669 Obesity, unspecified: Secondary | ICD-10-CM

## 2022-03-04 ENCOUNTER — Ambulatory Visit: Payer: PRIVATE HEALTH INSURANCE | Attending: Family Medicine | Admitting: Physical Therapy

## 2022-03-04 DIAGNOSIS — M25512 Pain in left shoulder: Secondary | ICD-10-CM | POA: Insufficient documentation

## 2022-03-04 DIAGNOSIS — M6281 Muscle weakness (generalized): Secondary | ICD-10-CM | POA: Diagnosis present

## 2022-03-04 DIAGNOSIS — M25612 Stiffness of left shoulder, not elsewhere classified: Secondary | ICD-10-CM | POA: Diagnosis present

## 2022-03-04 NOTE — Therapy (Signed)
OUTPATIENT PHYSICAL THERAPY TREATMENT   Patient Name: April Graham MRN: [id number] DOB:09-19-1980, 42 y.o., female Today's Date: 03/04/2022   PT End of Session - 03/04/22 1150     Visit Number 19    Number of Visits 21    Date for PT Re-Evaluation 03/12/22    Authorization Type WC    Authorization Time Period WC: 10/6-11/13 (10 treats, one eval in this time frame);    Progress Note Due on Visit 20    PT Start Time 1148    PT Stop Time 1228    PT Time Calculation (min) 40 min    Activity Tolerance Patient tolerated treatment well    Behavior During Therapy WFL for tasks assessed/performed                  Past Medical History:  Diagnosis Date   Anxiety    Arthritis    Family history of adverse reaction to anesthesia    Mother N&V   Irritable bowel syndrome (IBS)    Palpitations    PONV (postoperative nausea and vomiting)    Past Surgical History:  Procedure Laterality Date   ADENOIDECTOMY     KNEE ARTHROSCOPY     KNEE ARTHROSCOPY W/ ACL RECONSTRUCTION     TONSILLECTOMY     WISDOM TOOTH EXTRACTION     There are no problems to display for this patient.   PCP: Copland, Gay Filler, MD  REFERRING PROVIDER: Tania Ade, MD  REFERRING DIAG: s/p L shoulder arthroscopic debridement, SAD, DCE (distal clavicle excision)  THERAPY DIAG:  Acute pain of left shoulder  Muscle weakness (generalized)  Stiffness of left shoulder, not elsewhere classified  Rationale for Evaluation and Treatment Rehabilitation  ONSET DATE: 11/27/21  SUBJECTIVE:                                                                                                                                                                                      SUBJECTIVE STATEMENT:    Pt reports she feels her shoulder is much stronger and doing better. She has been " pushing the envelope" with her shoulder more with good overall response. She is still favoring her "storng side" but she is also doing  more variety of work related duties.   Return to MD: 03/19/22 with goal of return to normal work duty at that time.   PERTINENT HISTORY:  Patient is 42 y.o. F who has had left shoulder pain since an injury at work.  She responded well initially to a AC joint injection but the relief was only temporary. She also had signs of impingement with rotator cuff related pain. Patient had surgical  procedure for shoulder arthroscope with subacromial decompression as well as distal clavicular excision.   PAIN:  Are you having pain? No 2/10  PRECAUTIONS: None and Other: Back on floor on 12/20/21; folow up Nov 3 Dr. Has also given physical therapy for [place] (PT) to progress patient as we see appropriate with upper extremity strengthening.  WEIGHT BEARING RESTRICTIONS No  FALLS:  Has patient fallen in last 6 months? No  LIVING ENVIRONMENT: Lives with: lives with their family, lives with their partner, and boyfriend's kids live with her part time  Lives in: House/apartment Stairs: Yes: Internal: 11 steps; on right going up and External: 1 steps; none Has following equipment at home: None  OCCUPATION: Nurse in ED  PLOF: Independent  PATIENT GOALS Full functional activities for work ( pushing, pulling, lifting) UE endurance (changing IV bags)   OBJECTIVE:   PATIENT SURVEYS:  FOTO 46   TODAY'S TREATMENT: 03/04/22  Sci fit level 5 x 3 min pulling and 3 minutes pushing.   Wall pillow case slide with lift off 10 x with ABD lift off at end range  10 x with flexion plus end range lift off  Wall push up 2 x 10 on 4 foot elevated surface   Super set bent over row qith cable column (12.5#) with paloff press cable column (7.5#) 2 x 15 for each  -demonstration for proper form and cues provided as needed. -switched directions with paloff press   Standing serratus foam rolls into flexion, x 5 no resistance, then 2 x 5 with YTB loop around hands to target B  shoulder ER strength and endurance.    Eccentric SL ER  10 reps with 4 #  8 reps with 5 # 8 reps with 5 #    PATIENT EDUCATION: Education details: POC Person educated: Patient Education method: Explanation Education comprehension: verbalized understanding   HOME EXERCISE PROGRAM: Access Code: [email or web address] URL: [email or web address] Date: 02/11/2022 Prepared by: Janna Arch  Exercises - Seated Scapular Retraction  - 1 x daily - 7 x weekly - 2 sets - 10 reps - 5 hold - Seated Upper Trapezius Stretch  - 1 x daily - 7 x weekly - 2 sets - 2 reps - 30 hold - Seated Thoracic Lumbar Extension  - 1 x daily - 7 x weekly - 2 sets - 10 reps - 5 hold - Standing 'L' Stretch at Counter  - 1 x daily - 7 x weekly - 2 sets - 2 reps - 30 hold - Standing Bicep Curls Supinated with Dumbbells  - 1 x daily - 7 x weekly - 2 sets - 10 reps - 5 hold - Seated Bicep Curls Neutral with Dumbbells  - 1 x daily - 7 x weekly - 2 sets - 10 reps - 5 hold - Seated Triceps Extension with Dumbbell Single Arm  - 1 x daily - 7 x weekly - 2 sets - 10 reps - 5 hold - Kneeling Plank with Shoulder Row and Dumbbells  - 1 x daily - 7 x weekly - 2 sets - 10 reps - 5 hold - Seated Bent Over Shoulder Row with Dumbbells  - 1 x daily - 7 x weekly - 2 sets - 10 reps - 5 hold - Seated Single Arm Shoulder Abduction with Elbow Bent and Dumbbell  - 1 x daily - 7 x weekly - 2 sets - 10 reps - 5 hold - Shoulder Overhead Press in Abduction with Dumbbells  - 1 x daily -  7 x weekly - 2 sets - 10 reps - 5 hold - Push Up on Table  - 1 x daily - 7 x weekly - 2 sets - 10 reps - 5 hold - Forearm Plank on Wall  - 1 x daily - 7 x weekly - 2 sets - 2 reps - 30 hold   ASSESSMENT:  CLINICAL IMPRESSION:  Patient presents to physical therapy with excellent motivation for completion of physical therapy activities.  Patient makes good progress with therapeutic interventions as well as with functional activities and work as evidenced by ability to complete full work shifts  while completing more of her regular duties without any onset of pain or soreness following.  Patient also making great progress with exercises in today's session but still does have some soreness following higher intensity left shoulder strengthening and does still feel a difference between her right and left upper extremities when performing bilateral strengthening and functional activities.Pt will continue to benefit from skilled physical therapy intervention to address impairments, improve QOL, and attain therapy goals.     OBJECTIVE IMPAIRMENTS decreased ROM, decreased strength, impaired UE functional use, and pain.   ACTIVITY LIMITATIONS carrying, lifting, bed mobility, dressing, self feeding, and reach over head  PARTICIPATION LIMITATIONS: meal prep, cleaning, laundry, driving, shopping, community activity, occupation, and yard work  PERSONAL FACTORS Profession are also affecting patient's functional outcome.   REHAB POTENTIAL: Good  CLINICAL DECISION MAKING: Stable/uncomplicated  EVALUATION COMPLEXITY: Low   GOALS: Goals reviewed with patient? Yes  SHORT TERM GOALS: Target date: 13/04/2021       Patient will be independent in home exercise program to improve strength/mobility for better functional independence with ADLs. Baseline: No HEP currently  Goal status: MET   LONG TERM GOALS: Target date: 03/10/2022     Pt will report resting pain levels 1/10 or less in order to indicate improvement in subjective levels of pain. Baseline: 3/10 resting, 6-7/10 with movement 11/15: 2-3/10 soreness at worst at the moment 12/14: resting 0/10 worst pain: 3/10  Goal status: IN PROGRESS  2.  Patient will improve Foto score to 61 or greater in order to indicate improved subjective rating of upper extremity functional use Baseline: 46 11/15: 67  Goal status: MET  3.  Patient will improve left shoulder active range of motion to 160 degrees flexion, and abduction, and 80 degrees of  external and internal rotation in abducted shoulder position in order to improve patient's ability to perform functional activities. Baseline: See evaluation range of motion chart 01/13/22: 161 flexion, 151 abduction, full ROM on L UE in ER and IR in 90 degrees of abduction ; 12/14: flexion and abduction >160 IR/ER WFL Goal status: MET  4.  Patient will improve left shoulder strength in all major movements planes including abduction, scaption, flexion, external and internal rotation to 4+ out of 5 or greater in order to indicate improved shoulder strength for functional activities Baseline: Not assessed that he fell secondary to recency of surgical procedure 11/15: flexion  left 4+ abduction and flexion, ER 4+, IR 4+, some pain noted with abduction and ER combined 12/14: L flexion 5, abduction 4+/5 IR/ ER 4+ Goal status: IN PROGRESS  5.  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  Baseline: Unable to perform at this time without pain, 11/15: pt lifting/pushing/ pulling with 10lb weight limit and has some soreness but no pain  following day 12/14: still under restrictions; returned to work  Goal status: IN PROGRESS   PLAN: PT FREQUENCY: 1-2x/week  PT DURATION: 8 weeks  PLANNED INTERVENTIONS: Therapeutic exercises, Therapeutic activity, Neuromuscular re-education, Balance training, Gait training, Patient/Family education, Self Care, Joint mobilization, and Manual therapy  PLAN FOR NEXT SESSION:   Therex/theract for progression to nursing activities.    Particia Lather PT  03/04/22, 1:27 PM

## 2022-03-05 ENCOUNTER — Telehealth: Payer: Self-pay

## 2022-03-05 MED ORDER — SAXENDA 18 MG/3ML ~~LOC~~ SOPN: 3.0000 mg | PEN_INJECTOR | Freq: Every day | SUBCUTANEOUS | 3 refills | Status: DC

## 2022-03-05 NOTE — Telephone Encounter (Signed)
Was getting ready to initiate PA for [place]- last weight on file from July 2023, informed Pt she will need an updated OV for her weight. Instructed her to call office to schedule.

## 2022-03-08 ENCOUNTER — Ambulatory Visit: Payer: PRIVATE HEALTH INSURANCE | Attending: Family Medicine | Admitting: Physical Therapy

## 2022-03-08 ENCOUNTER — Encounter: Payer: Self-pay | Admitting: Physical Therapy

## 2022-03-08 DIAGNOSIS — R262 Difficulty in walking, not elsewhere classified: Secondary | ICD-10-CM | POA: Insufficient documentation

## 2022-03-08 DIAGNOSIS — M25612 Stiffness of left shoulder, not elsewhere classified: Secondary | ICD-10-CM | POA: Insufficient documentation

## 2022-03-08 DIAGNOSIS — M6281 Muscle weakness (generalized): Secondary | ICD-10-CM | POA: Insufficient documentation

## 2022-03-08 DIAGNOSIS — M25512 Pain in left shoulder: Secondary | ICD-10-CM | POA: Diagnosis present

## 2022-03-08 NOTE — Telephone Encounter (Signed)
Appt scheduled 03/15/22

## 2022-03-08 NOTE — Therapy (Addendum)
OUTPATIENT PHYSICAL THERAPY TREATMENT / Physical Therapy Progress Note   Dates of reporting period  01/14/22   to   03/08/22   Patient Name: April Graham MRN: [id number] DOB:March 17, 1980, 42 y.o., female Today's Date: 03/08/2022   PT End of Session - 03/08/22 1019     Visit Number 20    Number of Visits 21    Date for PT Re-Evaluation 03/12/22    Authorization Type WC    Progress Note Due on Visit 20    PT Start Time 1016    PT Stop Time 1054    PT Time Calculation (min) 38 min    Activity Tolerance Patient tolerated treatment well    Behavior During Therapy WFL for tasks assessed/performed                  Past Medical History:  Diagnosis Date   Anxiety    Arthritis    Family history of adverse reaction to anesthesia    Mother N&V   Irritable bowel syndrome (IBS)    Palpitations    PONV (postoperative nausea and vomiting)    Past Surgical History:  Procedure Laterality Date   ADENOIDECTOMY     KNEE ARTHROSCOPY     KNEE ARTHROSCOPY W/ ACL RECONSTRUCTION     TONSILLECTOMY     WISDOM TOOTH EXTRACTION     There are no problems to display for this patient.   PCP: Darreld Mclean, MD  REFERRING PROVIDER: Tania Ade, MD  REFERRING DIAG: s/p L shoulder arthroscopic debridement, SAD, DCE (distal clavicle excision)  THERAPY DIAG:  Stiffness of left shoulder, not elsewhere classified  Difficulty in walking, not elsewhere classified  Muscle weakness (generalized)  Acute pain of left shoulder  Rationale for Evaluation and Treatment Rehabilitation  ONSET DATE: 11/27/21  SUBJECTIVE:                                                                                                                                                                                      SUBJECTIVE STATEMENT:    Pt reports she had some localized muscle soreness after lass session but overall it has still been doing well.   Return to MD: 03/19/22 with goal of return to  normal work duty at that time.   PERTINENT HISTORY:  Patient is 42 y.o. F who has had left shoulder pain since an injury at work.  She responded well initially to a AC joint injection but the relief was only temporary. She also had signs of impingement with rotator cuff related pain. Patient had surgical procedure for shoulder arthroscope with subacromial decompression as well as distal clavicular excision.   PAIN:  Are you having pain? No 2/10  PRECAUTIONS: None and Other: Back on floor on 12/20/21; folow up Nov 3 Dr. Has also given physical therapy for [place] (PT) to progress patient as we see appropriate with upper extremity strengthening.  WEIGHT BEARING RESTRICTIONS No  FALLS:  Has patient fallen in last 6 months? No  LIVING ENVIRONMENT: Lives with: lives with their family, lives with their partner, and boyfriend's kids live with her part time  Lives in: House/apartment Stairs: Yes: Internal: 11 steps; on right going up and External: 1 steps; none Has following equipment at home: None  OCCUPATION: Nurse in ED  PLOF: Independent  PATIENT GOALS Full functional activities for work ( pushing, pulling, lifting) UE endurance (changing IV bags)   OBJECTIVE:   PATIENT SURVEYS:  FOTO 46   TODAY'S TREATMENT: 03/08/22  Sci fit level 5 x 3 min pulling and 3 minutes pushing.   Wall slides into abduction and flexion 5 x 20 seconds ea  Pillow case wall slide ( shoulders in Bilateral external rotation throughout) 10 x 5 sec holds  Superset wall plank for 45 seconds  with UE supported triceps kickback - with 5# for triceps extensions  Super set bent over row with with 8# and with paloff press (4#Ea UE)   Wall push ups 2 x 10   Internal rotation at cable column with towel under arm 3 x 10 with 7.5#      PATIENT EDUCATION: Education details: POC Person educated: Patient Education method: Explanation Education comprehension: verbalized understanding   HOME EXERCISE  PROGRAM: Access Code: [email or web address] URL: [email or web address] Date: 02/11/2022 Prepared by: Precious Bard  Exercises - Seated Scapular Retraction  - 1 x daily - 7 x weekly - 2 sets - 10 reps - 5 hold - Seated Upper Trapezius Stretch  - 1 x daily - 7 x weekly - 2 sets - 2 reps - 30 hold - Seated Thoracic Lumbar Extension  - 1 x daily - 7 x weekly - 2 sets - 10 reps - 5 hold - Standing 'L' Stretch at Counter  - 1 x daily - 7 x weekly - 2 sets - 2 reps - 30 hold - Standing Bicep Curls Supinated with Dumbbells  - 1 x daily - 7 x weekly - 2 sets - 10 reps - 5 hold - Seated Bicep Curls Neutral with Dumbbells  - 1 x daily - 7 x weekly - 2 sets - 10 reps - 5 hold - Seated Triceps Extension with Dumbbell Single Arm  - 1 x daily - 7 x weekly - 2 sets - 10 reps - 5 hold - Kneeling Plank with Shoulder Row and Dumbbells  - 1 x daily - 7 x weekly - 2 sets - 10 reps - 5 hold - Seated Bent Over Shoulder Row with Dumbbells  - 1 x daily - 7 x weekly - 2 sets - 10 reps - 5 hold - Seated Single Arm Shoulder Abduction with Elbow Bent and Dumbbell  - 1 x daily - 7 x weekly - 2 sets - 10 reps - 5 hold - Shoulder Overhead Press in Abduction with Dumbbells  - 1 x daily - 7 x weekly - 2 sets - 10 reps - 5 hold - Push Up on Table  - 1 x daily - 7 x weekly - 2 sets - 10 reps - 5 hold - Forearm Plank on Wall  - 1 x daily - 7 x weekly - 2 sets - 2 reps -  30 hold   ASSESSMENT:  CLINICAL IMPRESSION:  Patient presents to physical therapy with excellent motivation for completion of physical therapy activities.  Pt has progress note this date, pt making good progress but still cannot complete all of work activities without restriction and still has some pain in the form of soreness that is exclusive to the LUE. Patient makes good progress with therapeutic interventions as well as with functional activities and work as evidenced by ability to complete full work shifts while completing more of her regular duties  without any onset of pain or soreness following.  Patient also making great progress with exercises in today's session but still does have some soreness following higher intensity left shoulder strengthening and does still feel a difference between her right and left upper extremities when performing bilateral strengthening and functional activities.Pt will continue to benefit from skilled physical therapy intervention to address impairments, improve QOL, and attain therapy goals. Patient's condition has the potential to improve in response to therapy. Maximum improvement is yet to be obtained. The anticipated improvement is attainable and reasonable in a generally predictable time.       OBJECTIVE IMPAIRMENTS decreased ROM, decreased strength, impaired UE functional use, and pain.   ACTIVITY LIMITATIONS carrying, lifting, bed mobility, dressing, self feeding, and reach over head  PARTICIPATION LIMITATIONS: meal prep, cleaning, laundry, driving, shopping, community activity, occupation, and yard work  PERSONAL FACTORS Profession are also affecting patient's functional outcome.   REHAB POTENTIAL: Good  CLINICAL DECISION MAKING: Stable/uncomplicated  EVALUATION COMPLEXITY: Low   GOALS: Goals reviewed with patient? Yes  SHORT TERM GOALS: Target date: 13/04/2021       Patient will be independent in home exercise program to improve strength/mobility for better functional independence with ADLs. Baseline: No HEP currently  Goal status: MET   LONG TERM GOALS: Target date: 03/10/2022     Pt will report resting pain levels 1/10 or less in order to indicate improvement in subjective levels of pain. Baseline: 3/10 resting, 6-7/10 with movement 11/15: 2-3/10 soreness at worst at the moment 12/14: resting 0/10 worst pain: 3/10 03/08/22: worst pain 3/10 ( soreness following PT specific exercise but no "pain" Goal status: IN PROGRESS  2.  Patient will improve Foto score to 61 or greater in order  to indicate improved subjective rating of upper extremity functional use Baseline: 46 11/15: 67  Goal status: MET  3.  Patient will improve left shoulder active range of motion to 160 degrees flexion, and abduction, and 80 degrees of external and internal rotation in abducted shoulder position in order to improve patient's ability to perform functional activities. Baseline: See evaluation range of motion chart 01/13/22: 161 flexion, 151 abduction, full ROM on L UE in ER and IR in 90 degrees of abduction ; 12/14: flexion and abduction >160 IR/ER WFL Goal status: MET  4.  Patient will improve left shoulder strength in all major movements planes including abduction, scaption, flexion, external and internal rotation to 4+ out of 5 or greater in order to indicate improved shoulder strength for functional activities Baseline: Not assessed that he fell secondary to recency of surgical procedure 11/15: flexion  left 4+ abduction and flexion, ER 4+, IR 4+, some pain noted with abduction and ER combined 12/14: L flexion 5, abduction 4+/5 IR/ ER 4+ Goal status: MET  5.  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  Baseline:  Unable to perform at this time without pain, 11/15: pt lifting/pushing/ pulling with 10lb weight limit and has some soreness but no pain following day 12/14: still under restrictions; returned to work 03/08/22: Still not pushing/ pulling/ lifting > 25#  Goal status: IN PROGRESS   PLAN: PT FREQUENCY: 1-2x/week  PT DURATION: 8 weeks  PLANNED INTERVENTIONS: Therapeutic exercises, Therapeutic activity, Neuromuscular re-education, Balance training, Gait training, Patient/Family education, Self Care, Joint mobilization, and Manual therapy  PLAN FOR NEXT SESSION:   Therex/theract for progression to nursing activities.    Particia Lather PT  03/08/22, 1:14 PM

## 2022-03-10 ENCOUNTER — Encounter: Payer: 59 | Admitting: Physical Therapy

## 2022-03-12 ENCOUNTER — Ambulatory Visit: Payer: PRIVATE HEALTH INSURANCE | Admitting: Physical Therapy

## 2022-03-12 DIAGNOSIS — M25512 Pain in left shoulder: Secondary | ICD-10-CM

## 2022-03-12 DIAGNOSIS — M25612 Stiffness of left shoulder, not elsewhere classified: Secondary | ICD-10-CM

## 2022-03-12 DIAGNOSIS — M6281 Muscle weakness (generalized): Secondary | ICD-10-CM

## 2022-03-12 DIAGNOSIS — R262 Difficulty in walking, not elsewhere classified: Secondary | ICD-10-CM

## 2022-03-12 NOTE — Therapy (Signed)
OUTPATIENT PHYSICAL THERAPY TREATMENT / [place]   Patient Name: April Graham MRN: [id number] DOB:1980-06-08, 42 y.o., female Today's Date: 03/12/2022   PT End of Session - 03/12/22 0920     Visit Number 21    Number of Visits 29    Date for PT Re-Evaluation 04/09/22    Authorization Type WC    Progress Note Due on Visit 30    PT Start Time 0918    PT Stop Time 1001    PT Time Calculation (min) 43 min    Activity Tolerance Patient tolerated treatment well    Behavior During Therapy WFL for tasks assessed/performed                   Past Medical History:  Diagnosis Date   Anxiety    Arthritis    Family history of adverse reaction to anesthesia    Mother N&V   Irritable bowel syndrome (IBS)    Palpitations    PONV (postoperative nausea and vomiting)    Past Surgical History:  Procedure Laterality Date   ADENOIDECTOMY     KNEE ARTHROSCOPY     KNEE ARTHROSCOPY W/ ACL RECONSTRUCTION     TONSILLECTOMY     WISDOM TOOTH EXTRACTION     There are no problems to display for this patient.   PCP: Copland, Gay Filler, MD  REFERRING PROVIDER: Tania Ade, MD  REFERRING DIAG: s/p L shoulder arthroscopic debridement, SAD, DCE (distal clavicle excision)  THERAPY DIAG:  Stiffness of left shoulder, not elsewhere classified - Plan: PT plan of care cert/re-cert  Difficulty in walking, not elsewhere classified - Plan: PT plan of care cert/re-cert  Muscle weakness (generalized) - Plan: PT plan of care cert/re-cert  Acute pain of left shoulder - Plan: PT plan of care cert/re-cert  Rationale for Evaluation and Treatment Rehabilitation  ONSET DATE: 11/27/21  SUBJECTIVE:                                                                                                                                                                                      SUBJECTIVE STATEMENT:    Pt reports she still has some localized muscle soreness in her shoulder near insertion of  rotator cuff. She reports it has been improving and it was no worse after her last shift or her last session in PT.   Return to MD: 03/19/22 with goal of return to normal work duty at that time.   PERTINENT HISTORY:  Patient is 42 y.o. F who has had left shoulder pain since an injury at work.  She responded well initially to a AC joint injection but the relief was only  temporary. She also had signs of impingement with rotator cuff related pain. Patient had surgical procedure for shoulder arthroscope with subacromial decompression as well as distal clavicular excision.   PAIN:  Are you having pain? No 2/10  PRECAUTIONS: None and Other: Back on floor on 12/20/21; folow up Nov 3 Dr. Has also given physical therapy for [place] (PT) to progress patient as we see appropriate with upper extremity strengthening.  WEIGHT BEARING RESTRICTIONS No  FALLS:  Has patient fallen in last 6 months? No  LIVING ENVIRONMENT: Lives with: lives with their family, lives with their partner, and boyfriend's kids live with her part time  Lives in: House/apartment Stairs: Yes: Internal: 11 steps; on right going up and External: 1 steps; none Has following equipment at home: None  OCCUPATION: Nurse in ED  PLOF: Independent  PATIENT GOALS Full functional activities for work ( pushing, pulling, lifting) UE endurance (changing IV bags)   OBJECTIVE:   PATIENT SURVEYS:  FOTO 46   TODAY'S TREATMENT: 03/12/22 TE  Sci fit level 5 x 3 min pulling and 3 minutes pushing.   Wall ladders into abduction and flexion 4 x 15 seconds ea, soreness improved following this activity  Pillow case wall slide ( shoulders in Bilateral external rotation throughout) 10 x 2-3 sec holds  Manual:   Gentle distraction mobilizations as well as AP mobilizations in order to improve soreness in the extremity clavicular joint. Soft tissue mobilization in the form of of cross friction massage applied to insertional infraspinatus and  supraspinatus tendons in order to improve discomfort and free of mobility in this area.  Patient responds well to both of the above interventions with no increase in pain  There ACT   Stretcher pushing mimic activity.  Patient utilizes straight bar attached to cable system 7.5 pounds on each side and performs push out approximately 6 feet and then walk back while maintaining stability of straight bar.  Patient performs 3 sets of 5 repetitions for total of approximately 150 feet of overall movement of stretcher including 75 feet of pushing and 75 feet of cardiac weight while retro-ambulating    Survey:  Quick DASH: 8.3% disability including work quick dash -Goal to less than 2 % established.       PATIENT EDUCATION: Education details: POC Person educated: Patient Education method: Explanation Education comprehension: verbalized understanding   HOME EXERCISE PROGRAM: Access Code: [email or web address] URL: [email or web address] Date: 02/11/2022 Prepared by: Precious Bard  Exercises - Seated Scapular Retraction  - 1 x daily - 7 x weekly - 2 sets - 10 reps - 5 hold - Seated Upper Trapezius Stretch  - 1 x daily - 7 x weekly - 2 sets - 2 reps - 30 hold - Seated Thoracic Lumbar Extension  - 1 x daily - 7 x weekly - 2 sets - 10 reps - 5 hold - Standing 'L' Stretch at Counter  - 1 x daily - 7 x weekly - 2 sets - 2 reps - 30 hold - Standing Bicep Curls Supinated with Dumbbells  - 1 x daily - 7 x weekly - 2 sets - 10 reps - 5 hold - Seated Bicep Curls Neutral with Dumbbells  - 1 x daily - 7 x weekly - 2 sets - 10 reps - 5 hold - Seated Triceps Extension with Dumbbell Single Arm  - 1 x daily - 7 x weekly - 2 sets - 10 reps - 5 hold - Kneeling Plank with Shoulder Row and Dumbbells  - 1  x daily - 7 x weekly - 2 sets - 10 reps - 5 hold - Seated Bent Over Shoulder Row with Dumbbells  - 1 x daily - 7 x weekly - 2 sets - 10 reps - 5 hold - Seated Single Arm Shoulder Abduction with Elbow Bent and  Dumbbell  - 1 x daily - 7 x weekly - 2 sets - 10 reps - 5 hold - Shoulder Overhead Press in Abduction with Dumbbells  - 1 x daily - 7 x weekly - 2 sets - 10 reps - 5 hold - Push Up on Table  - 1 x daily - 7 x weekly - 2 sets - 10 reps - 5 hold - Forearm Plank on Wall  - 1 x daily - 7 x weekly - 2 sets - 2 reps - 30 hold   ASSESSMENT:  CLINICAL IMPRESSION:  Patient presents to physical therapy with excellent motivation for completion of physical therapy activities.  Pt presents for recert note this date, pt making good progress but still cannot complete all of work activities without restriction and still has some pain in the form of soreness that is exclusive to the LUE. Patient makes good progress with therapeutic interventions as well as with functional activities and work as evidenced by ability to complete full work shifts while completing more of her regular duties without any new onset of pain or soreness following.  Patient also making great progress with exercises in today's session but still does have some soreness following higher intensity left shoulder strengthening and does still feel a difference between her right and left upper extremities when performing bilateral strengthening and functional activities. Pt shows progress with stretcher pushing mimic activity in clinic today completing without pain or significant difficulty. Pt will continue to benefit from skilled physical therapy intervention to address impairments, improve QOL, and attain therapy goals. Patient's condition has the potential to improve in response to therapy. Maximum improvement is yet to be obtained. The anticipated improvement is attainable and reasonable in a generally predictable time.       OBJECTIVE IMPAIRMENTS decreased ROM, decreased strength, impaired UE functional use, and pain.   ACTIVITY LIMITATIONS carrying, lifting, bed mobility, dressing, self feeding, and reach over head  PARTICIPATION  LIMITATIONS: meal prep, cleaning, laundry, driving, shopping, community activity, occupation, and yard work  PERSONAL FACTORS Profession are also affecting patient's functional outcome.   REHAB POTENTIAL: Good  CLINICAL DECISION MAKING: Stable/uncomplicated  EVALUATION COMPLEXITY: Low   GOALS: Goals reviewed with patient? Yes  SHORT TERM GOALS: Target date: 13/04/2021       Patient will be independent in home exercise program to improve strength/mobility for better functional independence with ADLs. Baseline: No HEP currently  Goal status: MET   LONG TERM GOALS: Target date: 04/09/2022     Pt will report resting pain levels 1/10 or less in order to indicate improvement in subjective levels of pain. Baseline: 3/10 resting, 6-7/10 with movement 11/15: 2-3/10 soreness at worst at the moment 12/14: resting 0/10 worst pain: 3/10 03/08/22: worst pain 3/10 ( soreness following PT specific exercise but no "pain" Goal status: IN PROGRESS  2.  Patient will improve Foto score to 61 or greater in order to indicate improved subjective rating of upper extremity functional use Baseline: 46 11/15: 67  Goal status: MET  3.  Patient will improve left shoulder active range of motion to 160 degrees flexion, and abduction, and 80 degrees of external and internal rotation in abducted shoulder position  in order to improve patient's ability to perform functional activities. Baseline: See evaluation range of motion chart 01/13/22: 161 flexion, 151 abduction, full ROM on L UE in ER and IR in 90 degrees of abduction ; 12/14: flexion and abduction >160 IR/ER WFL Goal status: MET  4.  Patient will improve left shoulder strength in all major movements planes including abduction, scaption, flexion, external and internal rotation to 4+ out of 5 or greater in order to indicate improved shoulder strength for functional activities Baseline: Not assessed that he fell secondary to recency of surgical procedure 11/15:  flexion  left 4+ abduction and flexion, ER 4+, IR 4+, some pain noted with abduction and ER combined 12/14: L flexion 5, abduction 4+/5 IR/ ER 4+ Goal status: MET  5.  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  Baseline: Unable to perform at this time without pain, 11/15: pt lifting/pushing/ pulling with 10lb weight limit and has some soreness but no pain following day 12/14: still under restrictions; returned to work 03/08/22: Still not pushing/ pulling/ lifting > 25#and able to push 15 pounds without pain on 03/12/2022 Goal status: IN PROGRESS  6.  Patient will improve QuickDASH score by 5 percentage points or better in order to indicate improved subjective rating of upper extremity function without pain or limitations as a result of her left shoulder injury and surgery Baseline: 8.3% on 03/12/2022 Goal status: INITIAL      PLAN: PT FREQUENCY: 1-2x/week  PT DURATION: 8 weeks  PLANNED INTERVENTIONS: Therapeutic exercises, Therapeutic activity, Neuromuscular re-education, Balance training, Gait training, Patient/Family education, Self Care, Joint mobilization, and Manual therapy  PLAN FOR NEXT SESSION:   Therex/theract for progression to nursing activities.    Particia Lather PT  03/12/22, 10:42 AM

## 2022-03-14 NOTE — Progress Notes (Deleted)
[facility] [address], [place], [place] [address] [id number] [phone]  Date:  03/15/2022   Name:  April Graham   DOB:  Oct 05, 1980   MRN:  [id number]  PCP:  Darreld Mclean, MD    Chief Complaint: No chief complaint on file.   History of Present Illness:  April Graham is a 42 y.o. very pleasant female patient who presents with the following:  Patient seen today for a weight check.  She is using [place] for obesity about looks like she was recently changed over to [place], potentially due to supply issues    There are no problems to display for this patient.   Past Medical History:  Diagnosis Date   Anxiety    Arthritis    Family history of adverse reaction to anesthesia    Mother N&V   Irritable bowel syndrome (IBS)    Palpitations    PONV (postoperative nausea and vomiting)     Past Surgical History:  Procedure Laterality Date   ADENOIDECTOMY     KNEE ARTHROSCOPY     KNEE ARTHROSCOPY W/ ACL RECONSTRUCTION     TONSILLECTOMY     WISDOM TOOTH EXTRACTION      Social History   Tobacco Use   Smoking status: Never   Smokeless tobacco: Never  Vaping Use   Vaping Use: Never used  Substance Use Topics   Alcohol use: Yes    Comment: social   Drug use: Never    Family History  Problem Relation Age of Onset   Arthritis Mother    Depression Mother    Diabetes Mother    High Cholesterol Mother    Transient ischemic attack Mother    Arthritis Father    Cancer Father    COPD Father    Diabetes Father    Hypertension Father    Cancer Maternal Grandfather    Cancer Paternal Grandmother     No Known Allergies  Medication list has been reviewed and updated.  Current Outpatient Medications on File Prior to Visit  Medication Sig Dispense Refill   dexamethasone (DECADRON) 4 MG tablet Take one tablet by mouth twice a day with food (Patient not taking: Reported on 11/16/2021) 12 tablet 0    HYDROcodone-acetaminophen (NORCO/VICODIN) 5-325 MG tablet Take 1 tablet by mouth every 4 (four) hours as needed for moderate pain. 20 tablet 0   HYDROcodone-acetaminophen (NORCO/VICODIN) 5-325 MG tablet Take 1 tablet by mouth every four to six hours while awake as needed for pain 20 tablet 0   ibuprofen (ADVIL) 200 MG tablet Take 800 mg by mouth every 6 (six) hours as needed for mild pain or moderate pain.     Liraglutide -Weight Management (SAXENDA) 18 MG/3ML SOPN Inject 3 mg into the skin daily. Start with 0.6 mg injected daily.  Increase by 0.6 mg increments weekly to goal dose of 3 mg 9 mL 3   LORazepam (ATIVAN) 1 MG tablet Take 0.5 tablets (0.5 mg total) by mouth as needed. May take twice daily for occasional anxiety 30 tablet 0   meloxicam (MOBIC) 15 MG tablet Take one tablet by mouth daily with food (Patient not taking: Reported on 11/16/2021) 7 tablet 0   Norgestimate-Ethinyl Estradiol Triphasic (TRI-ESTARYLLA) 0.18/0.215/0.25 MG-35 MCG tablet Take 1 tablet by mouth daily. 84 tablet 3   propranolol (INDERAL) 10 MG tablet TAKE 1 TABLET (10 MG TOTAL) BY MOUTH 3 (THREE) TIMES DAILY. USE AS NEEDED  FOR TACHYCARDIA (Patient taking differently: Take 10 mg by mouth at bedtime. Use as needed for tachycardia) 60 tablet 1   No current facility-administered medications on file prior to visit.    Review of Systems:  As per HPI- otherwise negative.   Physical Examination: There were no vitals filed for this visit. There were no vitals filed for this visit. There is no height or weight on file to calculate BMI. Ideal Body Weight:    GEN: no acute distress. HEENT: Atraumatic, Normocephalic.  Ears and Nose: No external deformity. CV: RRR, No M/G/R. No JVD. No thrill. No extra heart sounds. PULM: CTA B, no wheezes, crackles, rhonchi. No retractions. No resp. distress. No accessory muscle use. ABD: S, NT, ND, +BS. No rebound. No HSM. EXTR: No c/c/e PSYCH: Normally interactive. Conversant.     Assessment and Plan: ***  Signed Lamar Blinks, MD

## 2022-03-15 ENCOUNTER — Ambulatory Visit: Payer: 59

## 2022-03-15 ENCOUNTER — Ambulatory Visit: Payer: 59 | Admitting: Family Medicine

## 2022-03-15 ENCOUNTER — Other Ambulatory Visit: Payer: Self-pay | Admitting: Family Medicine

## 2022-03-15 ENCOUNTER — Encounter: Payer: 59 | Admitting: Physical Therapy

## 2022-03-15 DIAGNOSIS — E669 Obesity, unspecified: Secondary | ICD-10-CM

## 2022-03-15 DIAGNOSIS — R000 Tachycardia, unspecified: Secondary | ICD-10-CM

## 2022-03-15 NOTE — Progress Notes (Signed)
Pt here for weight check per dr Lorelei Pont 03/05/22  Pt currently about to start tx with Saxenda injections.  Weight today= 275.0 lb

## 2022-03-15 NOTE — Telephone Encounter (Signed)
Wt Readings from Last 3 Encounters:  11/19/21 270 lb 12.8 oz (122.8 kg)  09/14/21 275 lb 6.4 oz (124.9 kg)  04/08/21 277 lb (125.6 kg)   Weight today 1/15- 270

## 2022-03-15 NOTE — Telephone Encounter (Signed)
PA initiated via Covermymeds; KEY: [id number]. Awaiting determination.

## 2022-03-16 NOTE — Telephone Encounter (Signed)
PA approved.   The authorization is effective from 03/15/2022 to 07/05/2022, as long as you are enrolled as  a member of your current health plan. The request was approved as submitted

## 2022-03-17 ENCOUNTER — Encounter: Payer: 59 | Admitting: Physical Therapy

## 2022-03-18 ENCOUNTER — Ambulatory Visit: Payer: PRIVATE HEALTH INSURANCE | Attending: Orthopedic Surgery | Admitting: Physical Therapy

## 2022-03-18 ENCOUNTER — Encounter: Payer: Self-pay | Admitting: Physical Therapy

## 2022-03-18 DIAGNOSIS — M25612 Stiffness of left shoulder, not elsewhere classified: Secondary | ICD-10-CM | POA: Diagnosis not present

## 2022-03-18 DIAGNOSIS — M6281 Muscle weakness (generalized): Secondary | ICD-10-CM | POA: Insufficient documentation

## 2022-03-18 DIAGNOSIS — M25512 Pain in left shoulder: Secondary | ICD-10-CM | POA: Insufficient documentation

## 2022-03-18 NOTE — Therapy (Signed)
OUTPATIENT PHYSICAL THERAPY TREATMENT   Patient Name: April Graham MRN: [id number] DOB:1980-07-09, 42 y.o., female Today's Date: 03/18/2022   PT End of Session - 03/18/22 1018     Visit Number 22    Number of Visits 29    Date for PT Re-Evaluation 04/09/22    Authorization Type WC    Progress Note Due on Visit 30    PT Start Time 1016    PT Stop Time 1056    PT Time Calculation (min) 40 min    Activity Tolerance Patient tolerated treatment well    Behavior During Therapy WFL for tasks assessed/performed                   Past Medical History:  Diagnosis Date   Anxiety    Arthritis    Family history of adverse reaction to anesthesia    Mother N&V   Irritable bowel syndrome (IBS)    Palpitations    PONV (postoperative nausea and vomiting)    Past Surgical History:  Procedure Laterality Date   ADENOIDECTOMY     KNEE ARTHROSCOPY     KNEE ARTHROSCOPY W/ ACL RECONSTRUCTION     TONSILLECTOMY     WISDOM TOOTH EXTRACTION     There are no problems to display for this patient.   PCP: Pearline Cables, MD  REFERRING PROVIDER: Jones Broom, MD  REFERRING DIAG: s/p L shoulder arthroscopic debridement, SAD, DCE (distal clavicle excision)  THERAPY DIAG:  Stiffness of left shoulder, not elsewhere classified  Muscle weakness (generalized)  Acute pain of left shoulder  Rationale for Evaluation and Treatment Rehabilitation  ONSET DATE: 11/27/21  SUBJECTIVE:                                                                                                                                                                                      SUBJECTIVE STATEMENT:    Pt reports she has been doing well with no increased soreness in her left upper extremity following working 3 days in a row including 212-hour shifts and one 8-hour shift as well as physical therapy last week.  Patient very confident in her ability to return to work and has another appointment with  her surgeon tomorrow for follow-up.  Return to MD: 03/19/22 with goal of return to normal work duty at that time.   PERTINENT HISTORY:  Patient is 42 y.o. F who has had left shoulder pain since an injury at work.  She responded well initially to a AC joint injection but the relief was only temporary. She also had signs of impingement with rotator cuff related pain. Patient had surgical procedure for shoulder  arthroscope with subacromial decompression as well as distal clavicular excision.   PAIN:  Are you having pain? No 2/10  PRECAUTIONS: None and Other: Back on floor on 12/20/21; folow up Nov 3 Dr. Has also given physical therapy for [place] (PT) to progress patient as we see appropriate with upper extremity strengthening.  WEIGHT BEARING RESTRICTIONS No  FALLS:  Has patient fallen in last 6 months? No  LIVING ENVIRONMENT: Lives with: lives with their family, lives with their partner, and boyfriend's kids live with her part time  Lives in: House/apartment Stairs: Yes: Internal: 11 steps; on right going up and External: 1 steps; none Has following equipment at home: None  OCCUPATION: Nurse in ED  PLOF: Independent  PATIENT GOALS Full functional activities for work ( pushing, pulling, lifting) UE endurance (changing IV bags)   OBJECTIVE:   PATIENT SURVEYS:  FOTO 46   TODAY'S TREATMENT: 03/18/22  TE  Sci fit level 5 x 3 min pulling and 3 minutes pushing.   Wall ladders into abduction and flexion 4 x 15 seconds ea, soreness improved following this activity  Pillow case wall slide ( shoulders in Bilateral external rotation throughout) 10 x 2-3 sec holds  Stretcher pushing and pulling mimic activity.  Patient utilizes straight bar attached to cable system 7.5 pounds on each side and performs push out approximately 6 feet and then walk back while maintaining stability of straight bar.  Patient performs 2 x 10 repetitions  repetitions for total of approximately 100 feet in each  direction -last set pt challenged to turn stretcher with press out/ pull/ push.  - no pain noted and good form   Super set bent over row with with 8# and with paloff press (4#Ea UE)   Wall push ups 2 x 10      PATIENT EDUCATION: Education details: POC Person educated: Patient Education method: Explanation Education comprehension: verbalized understanding   HOME EXERCISE PROGRAM: Access Code: [email or web address] PK URL: [email or web address] Date: 02/11/2022 Prepared by: Janna Arch  Exercises - Seated Scapular Retraction  - 1 x daily - 7 x weekly - 2 sets - 10 reps - 5 hold - Seated Upper Trapezius Stretch  - 1 x daily - 7 x weekly - 2 sets - 2 reps - 30 hold - Seated Thoracic Lumbar Extension  - 1 x daily - 7 x weekly - 2 sets - 10 reps - 5 hold - Standing 'L' Stretch at Counter  - 1 x daily - 7 x weekly - 2 sets - 2 reps - 30 hold - Standing Bicep Curls Supinated with Dumbbells  - 1 x daily - 7 x weekly - 2 sets - 10 reps - 5 hold - Seated Bicep Curls Neutral with Dumbbells  - 1 x daily - 7 x weekly - 2 sets - 10 reps - 5 hold - Seated Triceps Extension with Dumbbell Single Arm  - 1 x daily - 7 x weekly - 2 sets - 10 reps - 5 hold - Kneeling Plank with Shoulder Row and Dumbbells  - 1 x daily - 7 x weekly - 2 sets - 10 reps - 5 hold - Seated Bent Over Shoulder Row with Dumbbells  - 1 x daily - 7 x weekly - 2 sets - 10 reps - 5 hold - Seated Single Arm Shoulder Abduction with Elbow Bent and Dumbbell  - 1 x daily - 7 x weekly - 2 sets - 10 reps - 5 hold - Shoulder Overhead Press in Abduction with  Dumbbells  - 1 x daily - 7 x weekly - 2 sets - 10 reps - 5 hold - Push Up on Table  - 1 x daily - 7 x weekly - 2 sets - 10 reps - 5 hold - Forearm Plank on Wall  - 1 x daily - 7 x weekly - 2 sets - 2 reps - 30 hold   ASSESSMENT:  CLINICAL IMPRESSION:  Patient presents to physical therapy with excellent motivation for completion of physical therapy activities.  Patient continues to  make great progress at this time he progresses with several exercises including pushing and pulling of endemic stretcher utilizing cable column system.  Patient also progresses with general upper extremity strengthening with no pain or discrepancies between the left and right upper extremities this date.  Patient going back to Dr. Following this session and expects to be released to physical therapy at this time.  Patient has made great progress and is very pleased with her care.  Patient encouraged to continue with physical activity to keep her upper extremity strong as well as to continue to complete home exercise program at least once a week intervals following discharge from physical therapy and discharge from Workers Comp. benefits.     OBJECTIVE IMPAIRMENTS decreased ROM, decreased strength, impaired UE functional use, and pain.   ACTIVITY LIMITATIONS carrying, lifting, bed mobility, dressing, self feeding, and reach over head  PARTICIPATION LIMITATIONS: meal prep, cleaning, laundry, driving, shopping, community activity, occupation, and yard work  PERSONAL FACTORS Profession are also affecting patient's functional outcome.   REHAB POTENTIAL: Good  CLINICAL DECISION MAKING: Stable/uncomplicated  EVALUATION COMPLEXITY: Low   GOALS: Goals reviewed with patient? Yes  SHORT TERM GOALS: Target date: 13/04/2021       Patient will be independent in home exercise program to improve strength/mobility for better functional independence with ADLs. Baseline: No HEP currently  Goal status: MET   LONG TERM GOALS: Target date: 04/09/2022     Pt will report resting pain levels 1/10 or less in order to indicate improvement in subjective levels of pain. Baseline: 3/10 resting, 6-7/10 with movement 11/15: 2-3/10 soreness at worst at the moment 12/14: resting 0/10 worst pain: 3/10 03/08/22: worst pain 3/10 ( soreness following PT specific exercise but no "pain" Goal status: IN PROGRESS  2.   Patient will improve Foto score to 61 or greater in order to indicate improved subjective rating of upper extremity functional use Baseline: 46 11/15: 67  Goal status: MET  3.  Patient will improve left shoulder active range of motion to 160 degrees flexion, and abduction, and 80 degrees of external and internal rotation in abducted shoulder position in order to improve patient's ability to perform functional activities. Baseline: See evaluation range of motion chart 01/13/22: 161 flexion, 151 abduction, full ROM on L UE in ER and IR in 90 degrees of abduction ; 12/14: flexion and abduction >160 IR/ER WFL Goal status: MET  4.  Patient will improve left shoulder strength in all major movements planes including abduction, scaption, flexion, external and internal rotation to 4+ out of 5 or greater in order to indicate improved shoulder strength for functional activities Baseline: Not assessed that he fell secondary to recency of surgical procedure 11/15: flexion  left 4+ abduction and flexion, ER 4+, IR 4+, some pain noted with abduction and ER combined 12/14: L flexion 5, abduction 4+/5 IR/ ER 4+ Goal status: MET  5.  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  Baseline: Unable to perform at this time without pain, 11/15: pt lifting/pushing/ pulling with 10lb weight limit and has some soreness but no pain following day 12/14: still under restrictions; returned to work 03/08/22: Still not pushing/ pulling/ lifting > 25#and able to push 15 pounds without pain on 03/12/2022 Goal status: IN PROGRESS  6.  Patient will improve QuickDASH score by 5 percentage points or better in order to indicate improved subjective rating of upper extremity function without pain or limitations as a result of her left shoulder injury and surgery Baseline: 8.3% on 03/12/2022 Goal status:  INITIAL      PLAN: PT FREQUENCY: 1-2x/week  PT DURATION: 8 weeks  PLANNED INTERVENTIONS: Therapeutic exercises, Therapeutic activity, Neuromuscular re-education, Balance training, Gait training, Patient/Family education, Self Care, Joint mobilization, and Manual therapy  PLAN FOR NEXT SESSION:   Therex/theract for progression to nursing activities.    Norman Herrlich PT  03/18/22, 11:54 AM

## 2022-03-22 ENCOUNTER — Encounter: Payer: Self-pay | Admitting: Physical Therapy

## 2022-03-22 ENCOUNTER — Ambulatory Visit: Payer: PRIVATE HEALTH INSURANCE | Admitting: Physical Therapy

## 2022-03-22 DIAGNOSIS — M25612 Stiffness of left shoulder, not elsewhere classified: Secondary | ICD-10-CM

## 2022-03-22 DIAGNOSIS — M6281 Muscle weakness (generalized): Secondary | ICD-10-CM

## 2022-03-22 DIAGNOSIS — M25512 Pain in left shoulder: Secondary | ICD-10-CM

## 2022-03-22 NOTE — Therapy (Signed)
OUTPATIENT PHYSICAL THERAPY TREATMENT / Discharge Therapy   Patient Name: April Graham MRN: [id number] DOB:January 17, 1981, 42 y.o., female Today's Date: 03/22/2022   PT End of Session - 03/22/22 0810     Visit Number 23    Number of Visits 29    Date for PT Re-Evaluation 04/09/22    Authorization Type WC    Progress Note Due on Visit 61    PT Start Time 0805    PT Stop Time 0838    PT Time Calculation (min) 33 min    Activity Tolerance Patient tolerated treatment well    Behavior During Therapy WFL for tasks assessed/performed                   Past Medical History:  Diagnosis Date   Anxiety    Arthritis    Family history of adverse reaction to anesthesia    Mother N&V   Irritable bowel syndrome (IBS)    Palpitations    PONV (postoperative nausea and vomiting)    Past Surgical History:  Procedure Laterality Date   ADENOIDECTOMY     KNEE ARTHROSCOPY     KNEE ARTHROSCOPY W/ ACL RECONSTRUCTION     TONSILLECTOMY     WISDOM TOOTH EXTRACTION     There are no problems to display for this patient.   PCP: Copland, Gay Filler, MD  REFERRING PROVIDER: Tania Ade, MD  REFERRING DIAG: s/p L shoulder arthroscopic debridement, SAD, DCE (distal clavicle excision)  THERAPY DIAG:  Stiffness of left shoulder, not elsewhere classified  Muscle weakness (generalized)  Acute pain of left shoulder  Rationale for Evaluation and Treatment Rehabilitation  ONSET DATE: 11/27/21  SUBJECTIVE:                                                                                                                                                                                      SUBJECTIVE STATEMENT:    Pt MD reports she is clear for full return to work activities and requested she have one final PT session to review home exercise program for continued improvement and maintenance of function.   Return to MD: 03/19/22 with goal of return to normal work duty at that time.    PERTINENT HISTORY:  Patient is 42 y.o. F who has had left shoulder pain since an injury at work.  She responded well initially to a AC joint injection but the relief was only temporary. She also had signs of impingement with rotator cuff related pain. Patient had surgical procedure for shoulder arthroscope with subacromial decompression as well as distal clavicular excision.   PAIN:  Are you having pain? No 2/10  PRECAUTIONS:  None and Other: Back on floor on 12/20/21; folow up Nov 3 Dr. Has also given physical therapy for [place] (PT) to progress patient as we see appropriate with upper extremity strengthening.  WEIGHT BEARING RESTRICTIONS No  FALLS:  Has patient fallen in last 6 months? No  LIVING ENVIRONMENT: Lives with: lives with their family, lives with their partner, and boyfriend's kids live with her part time  Lives in: House/apartment Stairs: Yes: Internal: 11 steps; on right going up and External: 1 steps; none Has following equipment at home: None  OCCUPATION: Nurse in ED  PLOF: Independent  PATIENT GOALS Full functional activities for work ( pushing, pulling, lifting) UE endurance (changing IV bags)   OBJECTIVE:   PATIENT SURVEYS:  FOTO 46   TODAY'S TREATMENT: 03/22/22  TE Completed 1 x 10 of 1 x 30 sec of each of the following with instruction outside of PT these would be done with 2 sets of 10 reps.  Exercises - Seated Scapular Retraction  - Seated Upper Trapezius Stretch  - Seated Thoracic Lumbar Extension   - Standing 'L' Stretch at Counter   - Standing Bicep Curls Supinated with Dumbbells   - Seated Bicep Curls Neutral with Dumbbells  -  - Bent Over Tricep Extension with Counter Support  -  - Kneeling Plank with Shoulder Row and Dumbbells  -  - Seated Bent Over Shoulder Row with Dumbbells  -  - Seated Single Arm Shoulder Abduction with Elbow Bent and Dumbbell  -  - Shoulder Overhead Press in Abduction with Dumbbells  -  - Push Up on Table  - Forearm  Plank on Wall  -  - Shoulder External Rotation and Scapular Retraction with Resistance       PATIENT EDUCATION: Education details: POC Person educated: Patient Education method: Explanation Education comprehension: verbalized understanding   HOME EXERCISE PROGRAM: Access Code: [email or web address] PK URL: [email or web address] Date: 02/11/2022 Prepared by: Precious Bard  Exercises - Seated Scapular Retraction  - 1 x daily - 7 x weekly - 2 sets - 10 reps - 5 hold - Seated Upper Trapezius Stretch  - 1 x daily - 7 x weekly - 2 sets - 2 reps - 30 hold - Seated Thoracic Lumbar Extension  - 1 x daily - 7 x weekly - 2 sets - 10 reps - 5 hold - Standing 'L' Stretch at Counter  - 1 x daily - 7 x weekly - 2 sets - 2 reps - 30 hold - Standing Bicep Curls Supinated with Dumbbells  - 1 x daily - 7 x weekly - 2 sets - 10 reps - 5 hold - Seated Bicep Curls Neutral with Dumbbells  - 1 x daily - 7 x weekly - 2 sets - 10 reps - 5 hold - Seated Triceps Extension with Dumbbell Single Arm  - 1 x daily - 7 x weekly - 2 sets - 10 reps - 5 hold - Kneeling Plank with Shoulder Row and Dumbbells  - 1 x daily - 7 x weekly - 2 sets - 10 reps - 5 hold - Seated Bent Over Shoulder Row with Dumbbells  - 1 x daily - 7 x weekly - 2 sets - 10 reps - 5 hold - Seated Single Arm Shoulder Abduction with Elbow Bent and Dumbbell  - 1 x daily - 7 x weekly - 2 sets - 10 reps - 5 hold - Shoulder Overhead Press in Abduction with Dumbbells  - 1 x daily - 7 x weekly - 2  sets - 10 reps - 5 hold - Push Up on Table  - 1 x daily - 7 x weekly - 2 sets - 10 reps - 5 hold - Forearm Plank on Wall  - 1 x daily - 7 x weekly - 2 sets - 2 reps - 30 hold   ASSESSMENT:  CLINICAL IMPRESSION:  Patient presents with excellent motivation for completion of physical therapy activities.  Patient's doctor instructed her to come back for final update to her home exercise program as well as review of any continued instructions from physical  therapist.  Patient was run through her most recent home exercise program with several exercises either being added or altered in order to increase intensity for prolonged improvement and maintenance of her current level of function with her shoulder.  Patient verbalized understanding and had to progress exercises as indicated and with exercises to progress and reps but is which exercise to progress and resistance and weight.  Patient also expresses understanding of continued need for modification to her workplace activities including asking for help when performing heavy lifting or pushing activities.  Patient will be discharged for physical therapy at this time with home exercise program with all questions answered.    OBJECTIVE IMPAIRMENTS decreased ROM, decreased strength, impaired UE functional use, and pain.   ACTIVITY LIMITATIONS carrying, lifting, bed mobility, dressing, self feeding, and reach over head  PARTICIPATION LIMITATIONS: meal prep, cleaning, laundry, driving, shopping, community activity, occupation, and yard work  PERSONAL FACTORS Profession are also affecting patient's functional outcome.   REHAB POTENTIAL: Good  CLINICAL DECISION MAKING: Stable/uncomplicated  EVALUATION COMPLEXITY: Low   GOALS: Goals reviewed with patient? Yes  SHORT TERM GOALS: Target date: 13/04/2021       Patient will be independent in home exercise program to improve strength/mobility for better functional independence with ADLs. Baseline: No HEP currently  Goal status: MET   LONG TERM GOALS: Target date: 04/09/2022     Pt will report resting pain levels 1/10 or less in order to indicate improvement in subjective levels of pain. Baseline: 3/10 resting, 6-7/10 with movement 11/15: 2-3/10 soreness at worst at the moment 12/14: resting 0/10 worst pain: 3/10 03/08/22: worst pain 3/10 ( soreness following PT specific exercise but no "pain" Goal status: IN PROGRESS  2.  Patient will improve Foto  score to 61 or greater in order to indicate improved subjective rating of upper extremity functional use Baseline: 46 11/15: 67  Goal status: MET  3.  Patient will improve left shoulder active range of motion to 160 degrees flexion, and abduction, and 80 degrees of external and internal rotation in abducted shoulder position in order to improve patient's ability to perform functional activities. Baseline: See evaluation range of motion chart 01/13/22: 161 flexion, 151 abduction, full ROM on L UE in ER and IR in 90 degrees of abduction ; 12/14: flexion and abduction >160 IR/ER WFL Goal status: MET  4.  Patient will improve left shoulder strength in all major movements planes including abduction, scaption, flexion, external and internal rotation to 4+ out of 5 or greater in order to indicate improved shoulder strength for functional activities Baseline: Not assessed that he fell secondary to recency of surgical procedure 11/15: flexion  left 4+ abduction and flexion, ER 4+, IR 4+, some pain noted with abduction and ER combined 12/14: L flexion 5, abduction 4+/5 IR/ ER 4+ Goal status: MET  5.  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  Baseline: Unable to perform at this time without pain, 11/15: pt lifting/pushing/ pulling with 10lb weight limit and has some soreness but no pain following day 12/14: still under restrictions; returned to work 03/08/22: Still not pushing/ pulling/ lifting > 25#and able to push 15 pounds without pain on 03/12/2022 Goal status: IN PROGRESS  6.  Patient will improve QuickDASH score by 5 percentage points or better in order to indicate improved subjective rating of upper extremity function without pain or limitations as a result of her left shoulder injury and surgery Baseline: 8.3% on 03/12/2022 Goal status: INITIAL      PLAN: PT FREQUENCY:  1-2x/week  PT DURATION: 8 weeks  PLANNED INTERVENTIONS: Therapeutic exercises, Therapeutic activity, Neuromuscular re-education, Balance training, Gait training, Patient/Family education, Self Care, Joint mobilization, and Manual therapy  PLAN FOR NEXT SESSION:   D/C   Particia Lather PT  03/22/22, 10:04 AM

## 2022-03-24 ENCOUNTER — Encounter: Payer: 59 | Admitting: Physical Therapy

## 2022-03-25 ENCOUNTER — Other Ambulatory Visit: Payer: Self-pay

## 2022-03-25 MED ORDER — INSULIN PEN NEEDLE 31G X 5 MM MISC
0 refills | Status: DC
  Filled 2022-03-25: qty 100, 90d supply, fill #0

## 2022-03-25 MED ORDER — SAXENDA 18 MG/3ML ~~LOC~~ SOPN
3.0000 mg | PEN_INJECTOR | Freq: Every day | SUBCUTANEOUS | 3 refills | Status: DC
  Filled 2022-03-25: qty 15, 30d supply, fill #0

## 2022-03-29 ENCOUNTER — Encounter: Payer: Self-pay | Admitting: Family Medicine

## 2022-03-29 ENCOUNTER — Other Ambulatory Visit: Payer: Self-pay

## 2022-03-29 MED ORDER — ZEPBOUND 2.5 MG/0.5ML ~~LOC~~ SOAJ: 2.5000 mg | SUBCUTANEOUS | 1 refills | Status: DC

## 2022-03-30 ENCOUNTER — Other Ambulatory Visit: Payer: Self-pay

## 2022-03-30 ENCOUNTER — Telehealth: Payer: Self-pay

## 2022-03-30 MED ORDER — ZEPBOUND 2.5 MG/0.5ML ~~LOC~~ SOAJ
2.5000 mg | SUBCUTANEOUS | 1 refills | Status: DC
  Filled 2022-03-30 – 2022-04-14 (×2): qty 2, 28d supply, fill #0

## 2022-03-30 NOTE — Addendum Note (Signed)
Addended by: Darreld Mclean on: 03/30/2022 06:32 AM   Modules accepted: Orders

## 2022-03-30 NOTE — Telephone Encounter (Signed)
PA initiated via Covermymeds; KEY: [id number]. Awaiting determination.

## 2022-03-31 ENCOUNTER — Encounter: Payer: Self-pay | Admitting: Family Medicine

## 2022-03-31 NOTE — Telephone Encounter (Signed)
PA denied.   Your provider requested Zepbound pens for weight loss. For weight loss or weight  management, our guideline named ANTI-OBESITY AGENTS (reviewed for Zepbound)  requires that you will not use Zepbound concurrently (at the same time) with a glucagon-like  peptide-1 (GLP-1) receptor agonist (a class of medicine such as [place] and [place]). Our pharmacy records show that you recently filled [place] on 03/25/2022

## 2022-04-02 ENCOUNTER — Other Ambulatory Visit: Payer: Self-pay

## 2022-04-12 NOTE — Telephone Encounter (Signed)
Appeal approved. Effective 04/12/22 to 10/10/22.

## 2022-04-14 ENCOUNTER — Other Ambulatory Visit: Payer: Self-pay

## 2022-04-29 ENCOUNTER — Encounter: Payer: Self-pay | Admitting: Family Medicine

## 2022-04-29 MED ORDER — ZEPBOUND 5 MG/0.5ML ~~LOC~~ SOAJ: 5.0000 mg | SUBCUTANEOUS | 1 refills | Status: DC

## 2022-04-29 MED ORDER — ZEPBOUND 5 MG/0.5ML ~~LOC~~ SOAJ
5.0000 mg | SUBCUTANEOUS | 1 refills | Status: DC
  Filled 2022-04-29: qty 6, 84d supply, fill #0
  Filled 2022-05-07: qty 2, 28d supply, fill #0
  Filled 2022-06-21 – 2022-07-12 (×2): qty 2, 28d supply, fill #1

## 2022-04-29 NOTE — Addendum Note (Signed)
Addended by: Lamar Blinks C on: 04/29/2022 09:10 PM   Modules accepted: Orders

## 2022-04-30 ENCOUNTER — Other Ambulatory Visit: Payer: Self-pay

## 2022-05-07 ENCOUNTER — Other Ambulatory Visit (HOSPITAL_COMMUNITY): Payer: Self-pay

## 2022-05-07 ENCOUNTER — Other Ambulatory Visit: Payer: Self-pay

## 2022-05-30 ENCOUNTER — Encounter: Payer: Self-pay | Admitting: Family Medicine

## 2022-05-30 ENCOUNTER — Other Ambulatory Visit: Payer: Self-pay

## 2022-05-30 MED ORDER — ZEPBOUND 7.5 MG/0.5ML ~~LOC~~ SOAJ
7.5000 mg | SUBCUTANEOUS | 1 refills | Status: DC
  Filled 2022-05-30: qty 2, 28d supply, fill #0
  Filled 2022-06-14 – 2022-07-27 (×5): qty 2, 28d supply, fill #1
  Filled 2022-08-05: qty 2, 28d supply, fill #0
  Filled 2022-09-06: qty 2, 28d supply, fill #1

## 2022-06-03 ENCOUNTER — Other Ambulatory Visit: Payer: Self-pay

## 2022-06-04 ENCOUNTER — Other Ambulatory Visit: Payer: Self-pay

## 2022-06-15 ENCOUNTER — Other Ambulatory Visit: Payer: Self-pay

## 2022-06-21 ENCOUNTER — Other Ambulatory Visit: Payer: Self-pay

## 2022-06-23 ENCOUNTER — Other Ambulatory Visit: Payer: Self-pay

## 2022-06-24 ENCOUNTER — Other Ambulatory Visit: Payer: Self-pay

## 2022-07-06 ENCOUNTER — Other Ambulatory Visit: Payer: Self-pay

## 2022-07-11 ENCOUNTER — Other Ambulatory Visit: Payer: Self-pay | Admitting: Family Medicine

## 2022-07-11 DIAGNOSIS — R000 Tachycardia, unspecified: Secondary | ICD-10-CM

## 2022-07-12 ENCOUNTER — Other Ambulatory Visit: Payer: Self-pay

## 2022-07-15 ENCOUNTER — Other Ambulatory Visit: Payer: Self-pay

## 2022-07-30 ENCOUNTER — Other Ambulatory Visit: Payer: Self-pay

## 2022-08-05 ENCOUNTER — Other Ambulatory Visit: Payer: Self-pay

## 2022-08-05 ENCOUNTER — Other Ambulatory Visit (HOSPITAL_COMMUNITY): Payer: Self-pay

## 2022-08-11 ENCOUNTER — Telehealth: Payer: 59 | Admitting: Nurse Practitioner

## 2022-08-11 DIAGNOSIS — R300 Dysuria: Secondary | ICD-10-CM

## 2022-08-11 MED ORDER — CEPHALEXIN 500 MG PO CAPS: 500.0000 mg | ORAL_CAPSULE | Freq: Two times a day (BID) | ORAL | 0 refills | Status: AC

## 2022-08-11 NOTE — Progress Notes (Signed)

## 2022-09-07 ENCOUNTER — Other Ambulatory Visit: Payer: Self-pay

## 2022-09-09 DIAGNOSIS — H5213 Myopia, bilateral: Secondary | ICD-10-CM | POA: Diagnosis not present

## 2022-09-10 ENCOUNTER — Other Ambulatory Visit: Payer: Self-pay | Admitting: Family Medicine

## 2022-09-10 DIAGNOSIS — R000 Tachycardia, unspecified: Secondary | ICD-10-CM

## 2022-09-11 NOTE — Patient Instructions (Addendum)
It was great to see you again today, I will be in touch with your labs soon as possible Please continue annual mammogram- ordered at [facility] for you Assuming all is well, we can visit in 6 months for weight check if you continue to use GLP-1- we can go up to 10 mg   For the LUQ pain let's make sure your lipase and H pylori are ok, and you can try a PPI for 2 weeks

## 2022-09-11 NOTE — Progress Notes (Signed)
[facility] [address] [place], [place] [address] [phone]  Date:  09/20/2022   Name:  April Graham   DOB:  20-Apr-1980   MRN:  [id number]  PCP:  Pearline Cables, MD    Chief Complaint: Annual Exam (Concerns/ questions: pain under the L ribcage. Worse after eating. Becoming more frequent. )  History of Present Illness:  April Graham is a 42 y.o. very pleasant female patient who presents with the following:  Patient seen today for physical exam- History of anxiety, irritable bowel syndrome, knee problems  Most recent visit with myself was about 1 year ago  Contraception-vasectomy She had shoulder surgery per DR Ave Filter in September -this has done well  Pap completed 2022 Mammogram-completed September 14, 2021 Can update lab work  Has been using Zepbound  for weight loss.  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  She did an online consultation with a mental health provider.  She notes they prescribed Lexapro but she wanted asked me that prior to starting it.  She has taken other SSRIs in the past including Lexapro and Celexa and did fine.  She does not have any major depression symptoms, no suicidal ideation  Wt Readings from Last 3 Encounters:  09/20/22 243 lb 6.4 oz (110.4 kg)  11/19/21 270 lb 12.8 oz (122.8 kg)  09/14/21 275 lb 6.4 oz (124.9 kg)     There are no problems to display for this patient.   Past Medical History:  Diagnosis Date   Anxiety    Arthritis     Family history of adverse reaction to anesthesia    Mother N&V   Irritable bowel syndrome (IBS)    Palpitations    PONV (postoperative nausea and vomiting)     Past Surgical History:  Procedure Laterality Date   ADENOIDECTOMY     KNEE ARTHROSCOPY     KNEE ARTHROSCOPY W/ ACL RECONSTRUCTION     TONSILLECTOMY     WISDOM TOOTH EXTRACTION      Social History   Tobacco Use   Smoking status: Never   Smokeless tobacco: Never  Vaping Use   Vaping status: Never Used  Substance Use Topics   Alcohol use: Yes    Comment: social   Drug use: Never    Family History  Problem Relation Age of Onset   Arthritis Mother    Depression Mother    Diabetes Mother    High Cholesterol Mother    Transient ischemic attack Mother    Arthritis Father    Cancer Father    COPD Father    Diabetes Father    Hypertension Father    Cancer Maternal Grandfather    Cancer Paternal Grandmother     No Known Allergies  Medication list has been reviewed and updated.  Current Outpatient Medications on File Prior to Visit  Medication Sig  Dispense Refill   [employer] MAGNESIUM PO Take by mouth.     ibuprofen (ADVIL) 200 MG tablet Take 800 mg by mouth every 6 (six) hours as needed for mild pain or moderate pain.     LORazepam (ATIVAN) 1 MG tablet Take 0.5 tablets (0.5 mg total) by mouth as needed. May take twice daily for occasional anxiety 30 tablet 0   Probiotic Product (PROBIOTIC DAILY PO) Take by mouth.     propranolol (INDERAL) 10 MG tablet Take 1 tablet (10 mg total) by mouth 3 (three) times daily as needed (tachycardia). NEEDS APPT 90 tablet 0   Vitamin D-Vitamin K (D3 + K2 PO) Take by mouth.     No current facility-administered medications on file prior to visit.    Review of Systems:  As per HPI- otherwise negative.   Physical Examination: Vitals:   09/20/22 0939  BP: 110/60  Pulse: 71  Resp: 18  Temp: 98.1 F (36.7 C)  SpO2: 97%   Vitals:   09/20/22 0939  Weight: 243 lb 6.4 oz  (110.4 kg)  Height: 6' (1.829 m)   Body mass index is 33.01 kg/m. Ideal Body Weight: Weight in (lb) to have BMI = 25: 183.9  GEN: no acute distress.  Obese but has lost, looks well HEENT: Atraumatic, Normocephalic.  Bilateral TM wnl, oropharynx normal.  PEERL,EOMI.   Ears and Nose: No external deformity. CV: RRR, No M/G/R. No JVD. No thrill. No extra heart sounds. PULM: CTA B, no wheezes, crackles, rhonchi. No retractions. No resp. distress. No accessory muscle use. ABD: S, NT, ND, +BS. No rebound. No HSM.  Belly is benign EXTR: No c/c/e PSYCH: Normally interactive. Conversant.    Assessment and Plan: Physical exam  Screening for diabetes mellitus - Plan: Comprehensive metabolic panel, Hemoglobin A1c  Screening for hyperlipidemia - Plan: Lipid panel  Obesity (BMI 35.0-39.9 without comorbidity)  Screening mammogram for breast cancer - Plan: MM 3D SCREENING MAMMOGRAM BILATERAL BREAST  Screening for thyroid disorder - Plan: TSH  Screening for deficiency anemia - Plan: CBC  Fatigue, unspecified type - Plan: VITAMIN D 25 Hydroxy (Vit-D Deficiency, Fractures)  Encounter for surveillance of contraceptive pills - Plan: Norgestimate-Ethinyl Estradiol Triphasic (TRI-ESTARYLLA) 0.18/0.215/0.25 MG-35 MCG tablet  LUQ pain - Plan: Lipase, H. pylori breath test   Physical exam today.  Encouraged healthy diet and exercise routine Refilled OCP, ordered mammogram and routine blood work She has noted mild left upper quadrant pain for couple of weeks, not present during exam today.  We wonder if this may actually be rib pain.  Will order lipase and H pylori today, she will keep me posted about any change or worsening of her symptoms.  If labs are normal and symptoms persist we may need to get x-rays instead  She is having good results with her GLP-1, continue Will plan further follow- up pending labs.  Signed Abbe Amsterdam, MD  Received labs as below, message to patienti  Results for  orders placed or performed in visit on 09/20/22  CBC  Result Value Ref Range   WBC 7.6 4.0 - 10.5 K/uL   RBC 4.36 3.87 - 5.11 Mil/uL   Platelets 283.0 150.0 - 400.0 K/uL   Hemoglobin 13.7 12.0 - 15.0 g/dL   HCT 16.1 09.6 - 04.5 %   MCV 96.1 78.0 - 100.0 fl   MCHC 32.6 30.0 - 36.0 g/dL   RDW 40.9 81.1 - 91.4 %  Comprehensive metabolic panel  Result Value Ref Range   Sodium  140 135 - 145 mEq/L   Potassium 4.4 3.5 - 5.1 mEq/L   Chloride 106 96 - 112 mEq/L   CO2 23 19 - 32 mEq/L   Glucose, Bld 89 70 - 99 mg/dL   BUN 15 6 - 23 mg/dL   Creatinine, Ser 1.47 0.40 - 1.20 mg/dL   Total Bilirubin 0.4 0.2 - 1.2 mg/dL   Alkaline Phosphatase 57 39 - 117 U/L   AST 14 0 - 37 U/L   ALT 12 0 - 35 U/L   Total Protein 6.9 6.0 - 8.3 g/dL   Albumin 4.5 3.5 - 5.2 g/dL   GFR 82.95 >62.13 mL/min   Calcium 9.5 8.4 - 10.5 mg/dL  Hemoglobin Y8M  Result Value Ref Range   Hgb A1c MFr Bld 4.8 4.6 - 6.5 %  Lipid panel  Result Value Ref Range   Cholesterol 147 0 - 200 mg/dL   Triglycerides 578.4 0.0 - 149.0 mg/dL   HDL 69.62 >95.28 mg/dL   VLDL 41.3 0.0 - 24.4 mg/dL   LDL Cholesterol 67 0 - 99 mg/dL   Total CHOL/HDL Ratio 3    NonHDL 94.63   TSH  Result Value Ref Range   TSH 1.29 0.35 - 5.50 uIU/mL  VITAMIN D 25 Hydroxy (Vit-D Deficiency, Fractures)  Result Value Ref Range   VITD 69.06 30.00 - 100.00 ng/mL  Lipase  Result Value Ref Range   Lipase 18.0 11.0 - 59.0 U/L

## 2022-09-20 ENCOUNTER — Encounter: Payer: Self-pay | Admitting: Family Medicine

## 2022-09-20 ENCOUNTER — Other Ambulatory Visit: Payer: Self-pay

## 2022-09-20 ENCOUNTER — Ambulatory Visit (INDEPENDENT_AMBULATORY_CARE_PROVIDER_SITE_OTHER): Payer: 59 | Admitting: Family Medicine

## 2022-09-20 VITALS — BP 110/60 | HR 71 | Temp 98.1°F | Resp 18 | Ht 72.0 in | Wt 243.4 lb

## 2022-09-20 DIAGNOSIS — Z13220 Encounter for screening for lipoid disorders: Secondary | ICD-10-CM

## 2022-09-20 DIAGNOSIS — Z3041 Encounter for surveillance of contraceptive pills: Secondary | ICD-10-CM | POA: Diagnosis not present

## 2022-09-20 DIAGNOSIS — Z1329 Encounter for screening for other suspected endocrine disorder: Secondary | ICD-10-CM

## 2022-09-20 DIAGNOSIS — Z0000 Encounter for general adult medical examination without abnormal findings: Secondary | ICD-10-CM | POA: Diagnosis not present

## 2022-09-20 DIAGNOSIS — Z1231 Encounter for screening mammogram for malignant neoplasm of breast: Secondary | ICD-10-CM | POA: Diagnosis not present

## 2022-09-20 DIAGNOSIS — R1012 Left upper quadrant pain: Secondary | ICD-10-CM

## 2022-09-20 DIAGNOSIS — R5383 Other fatigue: Secondary | ICD-10-CM | POA: Diagnosis not present

## 2022-09-20 DIAGNOSIS — Z130 Encounter for screening for diseases of the blood and blood-forming organs and certain disorders involving the immune mechanism: Secondary | ICD-10-CM | POA: Diagnosis not present

## 2022-09-20 DIAGNOSIS — Z131 Encounter for screening for diabetes mellitus: Secondary | ICD-10-CM | POA: Diagnosis not present

## 2022-09-20 DIAGNOSIS — E669 Obesity, unspecified: Secondary | ICD-10-CM | POA: Diagnosis not present

## 2022-09-20 LAB — CBC
HCT: 42 % (ref 36.0–46.0)
Hemoglobin: 13.7 g/dL (ref 12.0–15.0)
MCHC: 32.6 g/dL (ref 30.0–36.0)
MCV: 96.1 fl (ref 78.0–100.0)
Platelets: 283 10*3/uL (ref 150.0–400.0)
RBC: 4.36 Mil/uL (ref 3.87–5.11)
RDW: 12.7 % (ref 11.5–15.5)
WBC: 7.6 10*3/uL (ref 4.0–10.5)

## 2022-09-20 LAB — LIPID PANEL
Cholesterol: 147 mg/dL (ref 0–200)
HDL: 52.8 mg/dL (ref >39.00)
LDL Cholesterol: 67 mg/dL (ref 0–99)
NonHDL: 94.63
Total CHOL/HDL Ratio: 3
Triglycerides: 140 mg/dL (ref 0.0–149.0)
VLDL: 28 mg/dL (ref 0.0–40.0)

## 2022-09-20 LAB — COMPREHENSIVE METABOLIC PANEL
ALT: 12 U/L (ref 0–35)
AST: 14 U/L (ref 0–37)
Albumin: 4.5 g/dL (ref 3.5–5.2)
Alkaline Phosphatase: 57 U/L (ref 39–117)
BUN: 15 mg/dL (ref 6–23)
CO2: 23 mEq/L (ref 19–32)
Calcium: 9.5 mg/dL (ref 8.4–10.5)
Chloride: 106 mEq/L (ref 96–112)
Creatinine, Ser: 0.81 mg/dL (ref 0.40–1.20)
GFR: 88.9 mL/min (ref >60.00)
Glucose, Bld: 89 mg/dL (ref 70–99)
Potassium: 4.4 mEq/L (ref 3.5–5.1)
Sodium: 140 mEq/L (ref 135–145)
Total Bilirubin: 0.4 mg/dL (ref 0.2–1.2)
Total Protein: 6.9 g/dL (ref 6.0–8.3)

## 2022-09-20 LAB — HEMOGLOBIN A1C: Hgb A1c MFr Bld: 4.8 % (ref 4.6–6.5)

## 2022-09-20 LAB — TSH: TSH: 1.29 u[IU]/mL (ref 0.35–5.50)

## 2022-09-20 LAB — LIPASE: Lipase: 18 U/L (ref 11.0–59.0)

## 2022-09-20 LAB — VITAMIN D 25 HYDROXY (VIT D DEFICIENCY, FRACTURES): VITD: 69.06 ng/mL (ref 30.00–100.00)

## 2022-09-20 MED ORDER — ZEPBOUND 10 MG/0.5ML ~~LOC~~ SOAJ
10.0000 mg | SUBCUTANEOUS | 2 refills | Status: AC
  Filled 2022-09-20 (×2): qty 6, 84d supply, fill #0
  Filled 2022-10-12: qty 2, 28d supply, fill #0
  Filled 2022-11-18: qty 2, 28d supply, fill #1
  Filled 2022-12-15: qty 2, 28d supply, fill #2
  Filled 2023-01-07: qty 2, 28d supply, fill #3

## 2022-09-20 MED ORDER — NORGESTIM-ETH ESTRAD TRIPHASIC 0.18/0.215/0.25 MG-35 MCG PO TABS: 1.0000 | ORAL_TABLET | Freq: Every day | ORAL | 3 refills | Status: DC

## 2022-09-23 ENCOUNTER — Encounter: Payer: Self-pay | Admitting: Family Medicine

## 2022-09-23 LAB — H. PYLORI BREATH TEST: H. pylori Breath Test: NOT DETECTED

## 2022-09-27 DIAGNOSIS — H00024 Hordeolum internum left upper eyelid: Secondary | ICD-10-CM | POA: Diagnosis not present

## 2022-10-06 ENCOUNTER — Encounter: Payer: Self-pay | Admitting: Family Medicine

## 2022-10-06 DIAGNOSIS — G8929 Other chronic pain: Secondary | ICD-10-CM

## 2022-10-07 ENCOUNTER — Telehealth: Payer: Self-pay | Admitting: *Deleted

## 2022-10-07 NOTE — Telephone Encounter (Signed)
Received denial from [facility] for dos, 09/20/22 as needing current insurance info.  Most recent copy of insurance card is [employer] plan.  Reached out to pt via mychart to see if she has new insurance card.  Awaiting reply.

## 2022-10-09 ENCOUNTER — Other Ambulatory Visit: Payer: Self-pay | Admitting: Family Medicine

## 2022-10-09 DIAGNOSIS — R000 Tachycardia, unspecified: Secondary | ICD-10-CM

## 2022-10-13 ENCOUNTER — Other Ambulatory Visit (HOSPITAL_COMMUNITY): Payer: Self-pay

## 2022-10-13 ENCOUNTER — Other Ambulatory Visit: Payer: Self-pay

## 2022-10-18 DIAGNOSIS — M1711 Unilateral primary osteoarthritis, right knee: Secondary | ICD-10-CM | POA: Diagnosis not present

## 2022-11-03 DIAGNOSIS — M1711 Unilateral primary osteoarthritis, right knee: Secondary | ICD-10-CM | POA: Diagnosis not present

## 2022-11-11 DIAGNOSIS — M1711 Unilateral primary osteoarthritis, right knee: Secondary | ICD-10-CM | POA: Diagnosis not present

## 2022-11-16 ENCOUNTER — Other Ambulatory Visit: Payer: Self-pay | Admitting: Family Medicine

## 2022-11-16 DIAGNOSIS — R000 Tachycardia, unspecified: Secondary | ICD-10-CM

## 2022-11-18 ENCOUNTER — Encounter (HOSPITAL_COMMUNITY): Payer: Self-pay

## 2022-11-18 ENCOUNTER — Other Ambulatory Visit (HOSPITAL_COMMUNITY): Payer: Self-pay

## 2022-11-18 DIAGNOSIS — M1711 Unilateral primary osteoarthritis, right knee: Secondary | ICD-10-CM | POA: Diagnosis not present

## 2022-11-19 ENCOUNTER — Other Ambulatory Visit: Payer: Self-pay

## 2022-11-19 ENCOUNTER — Other Ambulatory Visit (HOSPITAL_COMMUNITY): Payer: Self-pay

## 2022-11-20 ENCOUNTER — Other Ambulatory Visit (HOSPITAL_COMMUNITY): Payer: Self-pay

## 2022-11-22 ENCOUNTER — Other Ambulatory Visit (HOSPITAL_COMMUNITY): Payer: Self-pay

## 2022-11-23 ENCOUNTER — Other Ambulatory Visit: Payer: Self-pay

## 2022-12-03 ENCOUNTER — Encounter: Payer: Self-pay | Admitting: Family Medicine

## 2022-12-15 ENCOUNTER — Other Ambulatory Visit: Payer: Self-pay

## 2023-01-07 ENCOUNTER — Other Ambulatory Visit: Payer: Self-pay

## 2023-01-10 ENCOUNTER — Other Ambulatory Visit: Payer: Self-pay

## 2023-03-07 ENCOUNTER — Ambulatory Visit: Payer: PRIVATE HEALTH INSURANCE | Attending: Orthopedic Surgery | Admitting: Physical Therapy

## 2023-03-07 ENCOUNTER — Encounter: Payer: Self-pay | Admitting: Physical Therapy

## 2023-03-07 DIAGNOSIS — M25512 Pain in left shoulder: Secondary | ICD-10-CM | POA: Diagnosis present

## 2023-03-07 DIAGNOSIS — M6281 Muscle weakness (generalized): Secondary | ICD-10-CM | POA: Diagnosis present

## 2023-03-07 DIAGNOSIS — R262 Difficulty in walking, not elsewhere classified: Secondary | ICD-10-CM | POA: Diagnosis present

## 2023-03-07 DIAGNOSIS — M25612 Stiffness of left shoulder, not elsewhere classified: Secondary | ICD-10-CM | POA: Insufficient documentation

## 2023-03-07 NOTE — Therapy (Signed)
 OUTPATIENT PHYSICAL THERAPY SHOULDER EVALUATION   Patient Name: April Graham MRN: [id number] DOB:1980/06/12, 43 y.o., female Today's Date: 03/07/2023  END OF SESSION:  PT End of Session - 03/07/23 0858     Visit Number 1    Number of Visits 12    Date for PT Re-Evaluation 04/18/23    Authorization Type WC    Authorization Time Period 6 sessions until 03/25/23    Authorization - Visit Number 1    Authorization - Number of Visits 6    Progress Note Due on Visit 10    PT Start Time 0803    PT Stop Time 0843    PT Time Calculation (min) 40 min    Activity Tolerance Patient tolerated treatment well    Behavior During Therapy WFL for tasks assessed/performed             Past Medical History:  Diagnosis Date   Anxiety    Arthritis    Family history of adverse reaction to anesthesia    Mother N&V   Irritable bowel syndrome (IBS)    Palpitations    PONV (postoperative nausea and vomiting)    Past Surgical History:  Procedure Laterality Date   ADENOIDECTOMY     KNEE ARTHROSCOPY     KNEE ARTHROSCOPY W/ ACL RECONSTRUCTION     TONSILLECTOMY     WISDOM TOOTH EXTRACTION     There are no active problems to display for this patient.   PCP: Watt Harlene BROCKS, MD   REFERRING PROVIDER:   Dozier Soulier, MD    REFERRING DIAG:  [phone] (ICD-10-CM) - Left shoulder pain  M89.8X1 (ICD-10-CM) - Periscapular pain    THERAPY DIAG:  Stiffness of left shoulder, not elsewhere classified  Muscle weakness (generalized)  Acute pain of left shoulder  Difficulty in walking, not elsewhere classified  Rationale for Evaluation and Treatment: Rehabilitation  ONSET DATE: 09/13/22  SUBJECTIVE:                                                                                                                                                                                      SUBJECTIVE STATEMENT: Pt reports she did well for a while after discharge (6 mo) then mid summer she  started to have off and on pain in her left shoulder. Pt then started to have more consistent achiness in her upper trap and into the bottom of her neck. Pt got injection earlier in December around dec 15. Since she has had little pain. Internal rotation with resistance to the shoulder will sometimes cause pain which is how she initially injured the shoulder.  Hand dominance: Right  PERTINENT HISTORY: Patient  familiar to this clinic and seen 1 year prior for same concern.  Patient responded well to initial interventions but also did have to have a arthroscopic procedure working on the labrum, distal end of clavicle, and bone spurs.  PAIN:  Are you having pain? No Pain  PRECAUTIONS: None  RED FLAGS: None   WEIGHT BEARING RESTRICTIONS: No  FALLS:  Has patient fallen in last 6 months? 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   PLOF: Independent  PATIENT GOALS:Improve shoulder pain and restore to pre morbid function  NEXT MD VISIT:   OBJECTIVE:  Note: Objective measures were completed at Evaluation unless otherwise noted.  DIAGNOSTIC FINDINGS:  Unable to see most recent imaging in chart.  PATIENT SURVEYS:  Quick Dash 11.3 and FOTO 72  COGNITION: Overall cognitive status: Within functional limits for tasks assessed     SENSATION: Not tested  POSTURE: Rounded shoulders  UPPER EXTREMITY ROM:   Functional ROM WNL without pain   UPPER EXTREMITY MMT:  MMT Right eval Left eval  Shoulder flexion 5 4+  Shoulder extension    Shoulder abduction 5 4  Shoulder adduction    Shoulder internal rotation 5 4  Shoulder external rotation 5 4  Middle trapezius    Lower trapezius    Elbow flexion    Elbow extension    Wrist flexion     Wrist extension    Wrist ulnar deviation    Wrist radial deviation    Wrist pronation    Wrist supination    Grip strength (lbs)    (Blank rows = not tested)  SHOULDER SPECIAL TESTS: Impingement tests: Hawkins/Kennedy impingement test: positive  Rotator cuff assessment: Empty can test: positive  (sight discomfort) Biceps assessment: Speed's test: negative   PALPATION:  Upper trap left trapezius Tps noted                                                                                                                              TREATMENT DATE: 03/07/23    PATIENT EDUCATION: Education details: UT stretch and Scapular retractions  Person educated: Patient Education method: Explanation Education comprehension: verbalized understanding  HOME EXERCISE PROGRAM: Access Code: [email or web address] URL: [email or web address] Date: from discharge from last therapy session: 02/11/2022 Prepared by: Marina  Moser  Exercises - Seated Scapular Retraction  - 1 x daily - 7 x weekly - 2 sets - 10 reps - 5 hold - Seated Upper Trapezius Stretch  - 1 x daily - 7 x weekly - 2 sets - 2 reps - 30 hold - Seated Thoracic Lumbar Extension  - 1 x daily - 7 x weekly - 2 sets - 10 reps - 5 hold - Standing 'L' Stretch at [employer]  -  1 x daily - 7 x weekly - 2 sets - 2 reps - 30 hold - Standing Bicep Curls Supinated with Dumbbells  - 1 x daily - 7 x weekly - 2 sets - 10 reps - 5 hold - Seated Bicep Curls Neutral with Dumbbells  - 1 x daily - 7 x weekly - 2 sets - 10 reps - 5 hold - Seated Triceps Extension with Dumbbell Single Arm  - 1 x daily - 7 x weekly - 2 sets - 10 reps - 5 hold - Kneeling Plank with Shoulder Row and Dumbbells  - 1 x daily - 7 x weekly - 2 sets - 10 reps - 5 hold - Seated Bent Over Shoulder Row with Dumbbells  - 1 x daily - 7 x weekly - 2 sets - 10 reps - 5 hold - Seated Single Arm Shoulder Abduction with Elbow Bent and Dumbbell  - 1 x daily - 7 x weekly - 2 sets - 10 reps - 5  hold - Shoulder Overhead Press in Abduction with Dumbbells  - 1 x daily - 7 x weekly - 2 sets - 10 reps - 5 hold - Push Up on Table  - 1 x daily - 7 x weekly - 2 sets - 10 reps - 5 hold - Forearm Plank on Wall  - 1 x daily - 7 x weekly - 2 sets - 2 reps - 30 hold  ASSESSMENT:  CLINICAL IMPRESSION: Patient is a 43 y.o. 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  Patient initially seen for this problem at the end of 2023.  Patient saw great benefit from interventions performed as well as from procedures provided by Dr. Dozier.  Patient began to have some discomfort over the summer and it worsened until patient saw Dr. Dozier again in the middle of December.  Patient received a left shoulder injection and her pain is improved dramatically since then.  Patient does demonstrate some weakness in the left shoulder muscle compared to the right as well as some limitations as evidenced by QuickDASH score and subjective history.  Patient will benefit from skilled physical therapy to improve her left shoulder strength and allow her to return to work without pain and discomfort following her work shifts.  OBJECTIVE IMPAIRMENTS: decreased activity tolerance, decreased strength, impaired flexibility, impaired UE functional use, and pain.   ACTIVITY LIMITATIONS: carrying, lifting, and reach over head  PARTICIPATION LIMITATIONS: cleaning, laundry, community activity, and occupation  PERSONAL FACTORS: Time since onset of injury/illness/exacerbation are also affecting patient's functional outcome.   REHAB POTENTIAL: Excellent  CLINICAL DECISION MAKING: Stable/uncomplicated  EVALUATION COMPLEXITY: Low   GOALS: Goals reviewed with patient? Yes  SHORT TERM GOALS: Target date: 03/21/2023    Patient will be independent in home exercise program to improve strength/mobility for better functional  independence with ADLs. Baseline: No HEP currently  Goal status: MET   LONG TERM GOALS: Target date: 04/18/2023   Pt will report resting pain levels 1/10 or less in order to indicate improvement in subjective levels of pain. 03/07/23: No pain but has not worked or done anything to exacerbate discomfort in several weeks, want to maintain this through working conditions.  Goal status: INITIAL  2.  Patient will improve Foto score to 75 or greater in order to indicate improved subjective rating of upper extremity functional use Baseline: 72 Goal status: INITIAL  3.  Patient will improve left shoulder strength in all major movements planes including abduction, scaption, flexion, external and internal rotation to 4+ out of 5 or greater in order to indicate improved shoulder strength for functional activities Baseline: 4/5 in many L UE strength tests, see eval chart  Goal status: INITIAL  5.  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 Baseline: causing discomfort at work  Goal status: INITIAL  6.  Patient will improve QuickDASH score by 5 percentage points or better in order to indicate improved subjective rating of upper extremity function without pain or limitations as a result of her left shoulder injury and surgery Baseline:  03/07/23:11.3 Goal status:INITIAL  PLAN:  PT FREQUENCY: 1-2x/week  PT DURATION: 6 weeks  PLANNED INTERVENTIONS: 97110-Therapeutic exercises, 97530- Therapeutic activity, 97112- Neuromuscular re-education, 97535- Self Care, [id number]- Manual therapy, Taping, Dry Needling, Joint mobilization, Joint manipulation, Cryotherapy, and Moist heat  PLAN FOR NEXT SESSION: TP dry needling on upper trap, provide another HEP or go over previous, IR and ER strengthening as tolerated    Lonni KATHEE Gainer, PT 03/07/2023, 8:59 AM

## 2023-03-10 ENCOUNTER — Ambulatory Visit: Payer: PRIVATE HEALTH INSURANCE | Admitting: Physical Therapy

## 2023-03-10 DIAGNOSIS — M25612 Stiffness of left shoulder, not elsewhere classified: Secondary | ICD-10-CM | POA: Diagnosis not present

## 2023-03-10 DIAGNOSIS — M25512 Pain in left shoulder: Secondary | ICD-10-CM

## 2023-03-10 DIAGNOSIS — R262 Difficulty in walking, not elsewhere classified: Secondary | ICD-10-CM

## 2023-03-10 DIAGNOSIS — M6281 Muscle weakness (generalized): Secondary | ICD-10-CM

## 2023-03-10 NOTE — Therapy (Signed)
 OUTPATIENT PHYSICAL THERAPY SHOULDER TREATMENT   Patient Name: April Graham MRN: [id number] DOB:05/31/1980, 43 y.o., female Today's Date: 03/10/2023  END OF SESSION:  PT End of Session - 03/10/23 0805     Visit Number 2    Number of Visits 12    Date for PT Re-Evaluation 04/18/23    Authorization Type WC    Authorization Time Period 6 sessions until 03/25/23    Authorization - Number of Visits 6    Progress Note Due on Visit 10    PT Start Time 0805    PT Stop Time 0845    PT Time Calculation (min) 40 min    Activity Tolerance Patient tolerated treatment well    Behavior During Therapy WFL for tasks assessed/performed             Past Medical History:  Diagnosis Date   Anxiety    Arthritis    Family history of adverse reaction to anesthesia    Mother N&V   Irritable bowel syndrome (IBS)    Palpitations    PONV (postoperative nausea and vomiting)    Past Surgical History:  Procedure Laterality Date   ADENOIDECTOMY     KNEE ARTHROSCOPY     KNEE ARTHROSCOPY W/ ACL RECONSTRUCTION     TONSILLECTOMY     WISDOM TOOTH EXTRACTION     There are no active problems to display for this patient.   PCP: Watt Harlene BROCKS, MD   REFERRING PROVIDER:   Dozier Soulier, MD    REFERRING DIAG:  [phone] (ICD-10-CM) - Left shoulder pain  M89.8X1 (ICD-10-CM) - Periscapular pain    THERAPY DIAG:  Stiffness of left shoulder, not elsewhere classified  Muscle weakness (generalized)  Acute pain of left shoulder  Difficulty in walking, not elsewhere classified  Rationale for Evaluation and Treatment: Rehabilitation  ONSET DATE: 09/13/22  SUBJECTIVE:                                                                                                                                                                                      SUBJECTIVE STATEMENT: Pt reports feeling a little tight in the L shoulder/neck this AM, but no pain. 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    From Eval. Pt reports she did well for a while after discharge (6 mo) then mid summer she started to have off and on pain in her left shoulder. Pt then started to have more consistent achiness in her upper trap and into the bottom of her neck. Pt got injection earlier in December around dec 15. Since she  has had little pain. Internal rotation with resistance to the shoulder will sometimes cause pain which is how she initially injured the shoulder.  Hand dominance: Right  PERTINENT HISTORY: Patient familiar to this clinic and seen 1 year prior for same concern.  Patient responded well to initial interventions but also did have to have a arthroscopic procedure working on the labrum, distal end of clavicle, and bone spurs.  PAIN:  Are you having pain? No Pain  PRECAUTIONS: None  RED FLAGS: None   WEIGHT BEARING RESTRICTIONS: No  FALLS:  Has patient fallen in last 6 months? 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   PLOF: Independent  PATIENT GOALS:Improve shoulder pain and restore to pre morbid function  NEXT MD VISIT:   OBJECTIVE:  Note: Objective measures were completed at Evaluation unless otherwise noted.  DIAGNOSTIC FINDINGS:  Unable to see most recent imaging in chart.  PATIENT SURVEYS:  Quick Dash 11.3 and FOTO 72  COGNITION: Overall cognitive status: Within functional limits for tasks assessed     SENSATION: Not tested  POSTURE: Rounded shoulders  UPPER EXTREMITY ROM:   Functional ROM WNL without pain   UPPER EXTREMITY MMT:  MMT Right eval Left eval  Shoulder flexion 5 4+  Shoulder extension    Shoulder abduction 5 4  Shoulder  adduction    Shoulder internal rotation 5 4  Shoulder external rotation 5 4  Middle trapezius    Lower trapezius    Elbow flexion    Elbow extension    Wrist flexion    Wrist extension    Wrist ulnar deviation    Wrist radial deviation    Wrist pronation    Wrist supination    Grip strength (lbs)    (Blank rows = not tested)  SHOULDER SPECIAL TESTS: Impingement tests: Hawkins/Kennedy impingement test: positive  Rotator cuff assessment: Empty can test: positive  (sight discomfort) Biceps assessment: Speed's test: negative   PALPATION:  Upper trap left trapezius Tps noted                                                                                                                              TREATMENT DATE: 03/10/23 Trigger Point Dry Needling  Initial Treatment: Pt instructed on Dry Needling rational, procedures, and possible side effects. Pt instructed to expect mild to moderate muscle soreness later in the day and/or into the next day.  Pt instructed in methods to reduce muscle soreness. Pt instructed to continue prescribed HEP. Because Dry Needling was performed over or adjacent to a lung field, pt was educated on S/S of pneumothorax and to seek immediate medical attention should they occur.  Patient was educated on signs and symptoms of infection  and other risk factors and advised to seek medical attention should they occur.  Patient verbalized understanding of these instructions and education.   Patient Verbal Consent Given: Yes Education Handout Provided: Previously Provided Muscles Treated: UT, Levator cervical multifidi Cervicus  Electrical Stimulation Performed: No Treatment Response/Outcome: improved lateral flexion and Cspine.   UBE Scifit, 2 min forward/2 min reverse level 4.5.   Manual therapy for trigger point release to UT, Levator and cervical paraspinals. TDN(unbilled) as listed above.      Seated scapular retraction UT stretch 2 x 20 sec  bil Levator stretch x 20 sce bil  Shoulder extension with lower trap retraction  Shoulder ER and seratus/lower trap activation with YTB x 5 AROM x 8   Chin tuck x 5 with 5 sec hold   PATIENT EDUCATION: Education details: UT stretch and Scapular retractions  What is Trigger Point Dry Needling (DN)? DN is a physical therapy technique used to treat muscle pain and dysfunction. Specifically, DN helps deactivate muscle trigger points (muscle knots).  A thin filiform needle is used to penetrate the skin and stimulate the underlying trigger point. The goal is for a local twitch response (LTR) to occur and for the trigger point to relax. No medication of any kind is injected during the procedure.   What Does Trigger Point Dry Needling Feel Like?  The procedure feels different for each individual patient. Some patients report that they do not actually feel the needle enter the skin and overall the process is not painful. Very mild bleeding may occur. However, many patients feel a deep cramping in the muscle in which the needle was inserted. This is the local twitch response.   How Will I feel after the treatment? Soreness is normal, and the onset of soreness may not occur for a few hours. Typically this soreness does not last longer than two days.  Bruising is uncommon, however; ice can be used to decrease any possible bruising.  In rare cases feeling tired or nauseous after the treatment is normal. In addition, your symptoms may get worse before they get better, this period will typically not last longer than 24 hours.   What Can I do After My Treatment? Increase your hydration by drinking more water for the next 24 hours.  You may place ice or heat on the areas treated that have become sore, however, do not use heat on inflamed or bruised areas. Heat often brings more relief post needling. You can continue your regular activities, but vigorous activity is not recommended initially after the treatment  for 24 hours. DN is best combined with other physical therapy such as strengthening, stretching, and other therapies.   What are the complications? While your therapist has had extensive training in minimizing the risks of trigger point dry needling, it is important to understand the risks of any procedure.  Risks include bleeding, pain, fatigue, hematoma, infection, vertigo, nausea or nerve involvement. Monitor for any changes to your skin or sensation. Contact your therapist or MD with concerns.  A rare but serious complication is a pneumothorax over or near your middle and upper chest and back If you have dry needling in this area, monitor for the following symptoms: Shortness of breath on exertion and/or Difficulty taking a deep breath and/or Chest Pain and/or A dry cough If any of the above symptoms develop, please go to the nearest emergency room or call 911. Tell them you had dry needling over your thorax and report any symptoms you  are having. Please follow-up with your treating therapist after you complete the medical evaluation.   Person educated: Patient Education method: Explanation Education comprehension: verbalized understanding  HOME EXERCISE PROGRAM: Access Code: [email or web address] URL: [email or web address] Date: 03/10/2023 Prepared by: Massie Dollar  Exercises - Seated Upper Trapezius Stretch  - 1 x daily - 7 x weekly - 3 sets - 3 reps - 20 hold - Seated Levator Scapulae Stretch  - 1 x daily - 7 x weekly - 3 sets - 3 reps - 20 hold - Seated Scapular Retraction  - 1 x daily - 7 x weekly - 3 sets - 10 reps - 3 hold - Shoulder Extension with Resistance  - 1 x daily - 7 x weekly - 3 sets - 10 reps - 3 hold - Shoulder External Rotation and Scapular Retraction with Resistance  - 1 x daily - 7 x weekly - 3 sets - 10 reps - 3 hold - Seated Passive Cervical Retraction  - 1 x daily - 7 x weekly - 3 sets - 5 reps - 5  hold  ASSESSMENT:  CLINICAL IMPRESSION: Patient is a  44 y.o. 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  PT treatment focused on improved parascapular muscle activation, decreased tension in Cspine and UT via self stretch, manual therapy, and TDN. Noted improved lateral flexion in CSpine following TDN. patient will benefit from skilled physical therapy to improve her left shoulder strength and allow her to return to work without pain and discomfort following her work shifts.  OBJECTIVE IMPAIRMENTS: decreased activity tolerance, decreased strength, impaired flexibility, impaired UE functional use, and pain.   ACTIVITY LIMITATIONS: carrying, lifting, and reach over head  PARTICIPATION LIMITATIONS: cleaning, laundry, community activity, and occupation  PERSONAL FACTORS: Time since onset of injury/illness/exacerbation are also affecting patient's functional outcome.   REHAB POTENTIAL: Excellent  CLINICAL DECISION MAKING: Stable/uncomplicated  EVALUATION COMPLEXITY: Low   GOALS: Goals reviewed with patient? Yes  SHORT TERM GOALS: Target date: 03/21/2023    Patient will be independent in home exercise program to improve strength/mobility for better functional independence with ADLs. Baseline: No HEP currently  Goal status: MET   LONG TERM GOALS: Target date: 04/18/2023   Pt will report resting pain levels 1/10 or less in order to indicate improvement in subjective levels of pain. 03/07/23: No pain but has not worked or done anything to exacerbate discomfort in several weeks, want to maintain this through working conditions.  Goal status: INITIAL  2.  Patient will improve Foto score to 75 or greater in order to indicate improved subjective rating of upper extremity functional use Baseline: 72 Goal status: INITIAL   3.  Patient will improve left shoulder strength in all major movements planes including abduction, scaption, flexion, external and internal  rotation to 4+ out of 5 or greater in order to indicate improved shoulder strength for functional activities Baseline: 4/5 in many L UE strength tests, see eval chart  Goal status: INITIAL  5.  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 Baseline: causing discomfort at work  Goal status: INITIAL  6.  Patient will improve QuickDASH score by 5 percentage points or better in order to indicate improved subjective rating of upper extremity function without pain or limitations as a result of her left shoulder injury  and surgery Baseline:  03/07/23:11.3 Goal status:INITIAL  PLAN:  PT FREQUENCY: 1-2x/week  PT DURATION: 6 weeks  PLANNED INTERVENTIONS: 97110-Therapeutic exercises, 97530- Therapeutic activity, 97112- Neuromuscular re-education, 97535- Self Care, [id number]- Manual therapy, Taping, Dry Needling, Joint mobilization, Joint manipulation, Cryotherapy, and Moist heat  PLAN FOR NEXT SESSION:  Para scapular TP dry needling provide another HEP or go over previous, IR and ER strengthening as tolerated    Massie FORBES Dollar, PT 03/10/2023, 8:06 AM

## 2023-03-14 ENCOUNTER — Ambulatory Visit: Payer: PRIVATE HEALTH INSURANCE | Admitting: Physical Therapy

## 2023-03-14 DIAGNOSIS — M25512 Pain in left shoulder: Secondary | ICD-10-CM

## 2023-03-14 DIAGNOSIS — M6281 Muscle weakness (generalized): Secondary | ICD-10-CM

## 2023-03-14 DIAGNOSIS — M25612 Stiffness of left shoulder, not elsewhere classified: Secondary | ICD-10-CM | POA: Diagnosis not present

## 2023-03-14 NOTE — Therapy (Signed)
 OUTPATIENT PHYSICAL THERAPY SHOULDER TREATMENT   Patient Name: April Graham MRN: [id number] DOB:12/05/1980, 43 y.o., female Today's Date: 03/14/2023  END OF SESSION:  PT End of Session - 03/14/23 0800     Visit Number 3    Number of Visits 12    Date for PT Re-Evaluation 04/18/23    Authorization Type WC    Authorization Time Period 6 sessions until 03/25/23    Authorization - Number of Visits 6    Progress Note Due on Visit 10    PT Start Time 0801    PT Stop Time 0844    PT Time Calculation (min) 43 min    Activity Tolerance Patient tolerated treatment well    Behavior During Therapy WFL for tasks assessed/performed              Past Medical History:  Diagnosis Date   Anxiety    Arthritis    Family history of adverse reaction to anesthesia    Mother N&V   Irritable bowel syndrome (IBS)    Palpitations    PONV (postoperative nausea and vomiting)    Past Surgical History:  Procedure Laterality Date   ADENOIDECTOMY     KNEE ARTHROSCOPY     KNEE ARTHROSCOPY W/ ACL RECONSTRUCTION     TONSILLECTOMY     WISDOM TOOTH EXTRACTION     There are no active problems to display for this patient.   PCP: Copland, Harlene BROCKS, MD   REFERRING PROVIDER:   Dozier Soulier, MD    REFERRING DIAG:  [phone] (ICD-10-CM) - Left shoulder pain  M89.8X1 (ICD-10-CM) - Periscapular pain    THERAPY DIAG:  Stiffness of left shoulder, not elsewhere classified  Muscle weakness (generalized)  Acute pain of left shoulder  Rationale for Evaluation and Treatment: Rehabilitation  ONSET DATE: 09/13/22  SUBJECTIVE:                                                                                                                                                                                      SUBJECTIVE STATEMENT:  Pt reports some shoulder soreness bilaterally. She was at computer all weekend working. She reports improvement in her range of motion following dry needling.     From Eval. Pt reports she did well for a while after discharge (6 mo) then mid summer she started to have off and on pain in her left shoulder. Pt then started to have more consistent achiness in her upper trap and into the bottom of her neck. Pt got injection earlier in December around dec 15. Since she has had little pain. Internal rotation with resistance to the shoulder will sometimes cause pain  which is how she initially injured the shoulder.  Hand dominance: Right  PERTINENT HISTORY: Patient familiar to this clinic and seen 1 year prior for same concern.  Patient responded well to initial interventions but also did have to have a arthroscopic procedure working on the labrum, distal end of clavicle, and bone spurs.  PAIN:  Are you having pain? No Pain  PRECAUTIONS: None  RED FLAGS: None   WEIGHT BEARING RESTRICTIONS: No  FALLS:  Has patient fallen in last 6 months? 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   PLOF: Independent  PATIENT GOALS:Improve shoulder pain and restore to pre morbid function  NEXT MD VISIT:   OBJECTIVE:  Note: Objective measures were completed at Evaluation unless otherwise noted.  DIAGNOSTIC FINDINGS:  Unable to see most recent imaging in chart.  PATIENT SURVEYS:  Quick Dash 11.3 and FOTO 72  COGNITION: Overall cognitive status: Within functional limits for tasks assessed     SENSATION: Not tested  POSTURE: Rounded shoulders  UPPER EXTREMITY ROM:   Functional ROM WNL without pain   UPPER EXTREMITY MMT:  MMT Right eval Left eval  Shoulder flexion 5 4+  Shoulder extension    Shoulder abduction 5 4  Shoulder adduction    Shoulder internal rotation 5 4  Shoulder external rotation 5 4   Middle trapezius    Lower trapezius    Elbow flexion    Elbow extension    Wrist flexion    Wrist extension    Wrist ulnar deviation    Wrist radial deviation    Wrist pronation    Wrist supination    Grip strength (lbs)    (Blank rows = not tested)  SHOULDER SPECIAL TESTS: Impingement tests: Hawkins/Kennedy impingement test: positive  Rotator cuff assessment: Empty can test: positive  (sight discomfort) Biceps assessment: Speed's test: negative   PALPATION:  Upper trap left trapezius Tps noted                                                                                                                              TREATMENT DATE: 03/14/23  Manual:  UT stretch with GH depression 2 x 1 min on ea side  Levator stretch with Gh depression 2 x 1 min on ea side   TE supine scapular retraction 2 x 10   Supine Chin tuck 2 x 10 x 3 sec hold in supine Shoulder ER and seratus/lower trap activation with YTB 2 x 5  with 3 second hold Shoulder extension for lower trap activation 2 x 10 ea LE 7.5 #  Shoulder row with bull bar on matrix cable system 2 x 15 reps with 12.5# resistance     PATIENT EDUCATION: Education  details: Pt educated throughout session about proper posture and technique with exercises. Improved exercise technique, movement at target joints, use of target muscles after min to mod verbal, visual, tactile cues. Person educated: Patient Education method: Explanation Education comprehension: verbalized understanding  HOME EXERCISE PROGRAM: Access Code: [email or web address] URL: [email or web address] Date: 03/10/2023 Prepared by: Massie Dollar  Exercises - Seated Upper Trapezius Stretch  - 1 x daily - 7 x weekly - 3 sets - 3 reps - 20 hold - Seated Levator Scapulae Stretch  - 1 x daily - 7 x weekly - 3 sets - 3 reps - 20 hold - Seated Scapular Retraction  - 1 x daily - 7 x weekly - 3 sets - 10 reps - 3 hold - Shoulder Extension with Resistance  - 1 x daily -  7 x weekly - 3 sets - 10 reps - 3 hold - Shoulder External Rotation and Scapular Retraction with Resistance  - 1 x daily - 7 x weekly - 3 sets - 10 reps - 3 hold - Seated Passive Cervical Retraction  - 1 x daily - 7 x weekly - 3 sets - 5 reps - 5  hold  ASSESSMENT:  CLINICAL IMPRESSION:  Patient arrived with good motivation form completion of pt activities.  Pt having some soreness in UT region following working at desk over the weekend and good response to TP dry needling last session. Progressed with postural and scapular strength this session. Pt had some fatigue in shoulders at end of session but no increase in pain. Pt will continue to benefit from skilled physical therapy intervention to address impairments, improve QOL, and attain therapy goals.    OBJECTIVE IMPAIRMENTS: decreased activity tolerance, decreased strength, impaired flexibility, impaired UE functional use, and pain.   ACTIVITY LIMITATIONS: carrying, lifting, and reach over head  PARTICIPATION LIMITATIONS: cleaning, laundry, community activity, and occupation  PERSONAL FACTORS: Time since onset of injury/illness/exacerbation are also affecting patient's functional outcome.   REHAB POTENTIAL: Excellent  CLINICAL DECISION MAKING: Stable/uncomplicated  EVALUATION COMPLEXITY: Low   GOALS: Goals reviewed with patient? Yes  SHORT TERM GOALS: Target date: 03/21/2023    Patient will be independent in home exercise program to improve strength/mobility for better functional independence with ADLs. Baseline: No HEP currently  Goal status: MET   LONG TERM GOALS: Target date: 04/18/2023   Pt will report resting pain levels 1/10 or less in order to indicate improvement in subjective levels of pain. 03/07/23: No pain but has not worked or done anything to exacerbate discomfort in several weeks, want to maintain this through working conditions.  Goal status: INITIAL  2.  Patient will improve Foto score to 75 or greater in  order to indicate improved subjective rating of upper extremity functional use Baseline: 72 Goal status: INITIAL   3.  Patient will improve left shoulder strength in all major movements planes including abduction, scaption, flexion, external and internal rotation to 4+ out of 5 or greater in order to indicate improved shoulder strength for functional activities Baseline: 4/5 in many L UE strength tests, see eval chart  Goal status: INITIAL  5.  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 Baseline: causing discomfort at work  Goal status: INITIAL  6.  Patient will improve QuickDASH score by 5 percentage points or better in order to indicate improved subjective rating of upper extremity function without pain or limitations as a  result of her left shoulder injury and surgery Baseline:  03/07/23:11.3 Goal status:INITIAL  PLAN:  PT FREQUENCY: 1-2x/week  PT DURATION: 6 weeks  PLANNED INTERVENTIONS: 97110-Therapeutic exercises, 97530- Therapeutic activity, 97112- Neuromuscular re-education, 97535- Self Care, [id number]- Manual therapy, Taping, Dry Needling, Joint mobilization, Joint manipulation, Cryotherapy, and Moist heat  PLAN FOR NEXT SESSION:  Para scapular TP dry needling , IR and ER strengthening as tolerated, scapular and postural strengthening   Lonni KATHEE Gainer, PT 03/14/2023, 8:00 AM

## 2023-03-18 ENCOUNTER — Ambulatory Visit: Payer: PRIVATE HEALTH INSURANCE | Admitting: Physical Therapy

## 2023-03-18 DIAGNOSIS — M25612 Stiffness of left shoulder, not elsewhere classified: Secondary | ICD-10-CM

## 2023-03-18 DIAGNOSIS — M6281 Muscle weakness (generalized): Secondary | ICD-10-CM

## 2023-03-18 DIAGNOSIS — M25512 Pain in left shoulder: Secondary | ICD-10-CM

## 2023-03-18 NOTE — Therapy (Signed)
OUTPATIENT PHYSICAL THERAPY SHOULDER TREATMENT   Patient Name: April Graham MRN: [id number] DOB:1980/07/09, 43 y.o., female Today's Date: 03/18/2023  END OF SESSION:  PT End of Session - 03/18/23 0844     Visit Number 4    Number of Visits 12    Date for PT Re-Evaluation 04/18/23    Authorization Type WC    Authorization Time Period 6 sessions until 03/25/23    Authorization - Visit Number 4    Authorization - Number of Visits 6    Progress Note Due on Visit 10    PT Start Time 0808    PT Stop Time 0844    PT Time Calculation (min) 36 min    Activity Tolerance Patient tolerated treatment well    Behavior During Therapy WFL for tasks assessed/performed               Past Medical History:  Diagnosis Date   Anxiety    Arthritis    Family history of adverse reaction to anesthesia    Mother N&V   Irritable bowel syndrome (IBS)    Palpitations    PONV (postoperative nausea and vomiting)    Past Surgical History:  Procedure Laterality Date   ADENOIDECTOMY     KNEE ARTHROSCOPY     KNEE ARTHROSCOPY W/ ACL RECONSTRUCTION     TONSILLECTOMY     WISDOM TOOTH EXTRACTION     There are no active problems to display for this patient.   PCP: Copland, Gwenlyn Found, MD   REFERRING PROVIDER:   Jones Broom, MD    REFERRING DIAG:  [phone] (ICD-10-CM) - Left shoulder pain  M89.8X1 (ICD-10-CM) - Periscapular pain    THERAPY DIAG:  Stiffness of left shoulder, not elsewhere classified  Muscle weakness (generalized)  Acute pain of left shoulder  Rationale for Evaluation and Treatment: Rehabilitation  ONSET DATE: 09/13/22  SUBJECTIVE:                                                                                                                                                                                      SUBJECTIVE STATEMENT:  Pt reports her shoulder feeling good. She was in pt care over the week and her shoulder held up well.    From Eval. Pt reports  she did well for a while after discharge (6 mo) then mid summer she started to have off and on pain in her left shoulder. Pt then started to have more consistent achiness in her upper trap and into the bottom of her neck. Pt got injection earlier in December around dec 15. Since she has had little pain. Internal rotation with resistance to the  shoulder will sometimes cause pain which is how she initially injured the shoulder.  Hand dominance: Right  PERTINENT HISTORY: Patient familiar to this clinic and seen 1 year prior for same concern.  Patient responded well to initial interventions but also did have to have a arthroscopic procedure working on the labrum, distal end of clavicle, and bone spurs.  PAIN:  Are you having pain? No Pain  PRECAUTIONS: None  RED FLAGS: None   WEIGHT BEARING RESTRICTIONS: No  FALLS:  Has patient fallen in last 6 months? 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   PLOF: Independent  PATIENT GOALS:Improve shoulder pain and restore to pre morbid function  NEXT MD VISIT:   OBJECTIVE:  Note: Objective measures were completed at Evaluation unless otherwise noted.  DIAGNOSTIC FINDINGS:  Unable to see most recent imaging in chart.  PATIENT SURVEYS:  Quick Dash 11.3 and FOTO 72  COGNITION: Overall cognitive status: Within functional limits for tasks assessed     SENSATION: Not tested  POSTURE: Rounded shoulders  UPPER EXTREMITY ROM:   Functional ROM WNL without pain   UPPER EXTREMITY MMT:  MMT Right eval Left eval  Shoulder flexion 5 4+  Shoulder extension    Shoulder abduction 5 4  Shoulder adduction    Shoulder internal rotation 5 4  Shoulder external rotation 5 4  Middle trapezius    Lower  trapezius    Elbow flexion    Elbow extension    Wrist flexion    Wrist extension    Wrist ulnar deviation    Wrist radial deviation    Wrist pronation    Wrist supination    Grip strength (lbs)    (Blank rows = not tested)  SHOULDER SPECIAL TESTS: Impingement tests: Hawkins/Kennedy impingement test: positive  Rotator cuff assessment: Empty can test: positive  (sight discomfort) Biceps assessment: Speed's test: negative   PALPATION:  Upper trap left trapezius Tps noted                                                                                                                              TREATMENT DATE: 03/18/23  Manual:  L supraspinatus and infraspinatus TP release ( ischemic) x 8 min   TE supine scapular retraction 2 x 10   Supine Chin tuck  x 10 x 3 sec hold in supine  Supine Shoulder ER and seratus/lower trap activation with RTB 2 x 8  with 3 second hold Supine horizontal abduction 2 x 8 reps  Shoulder extension for lower trap activation x 15 ea LE 7.5 #  -x 8 reps at 12.5# ( hard)  Shoulder row with "bull bar" on matrix cable system 2 x 15 reps with 17.5#  resistance  Shoulder IR with 7.5# resistance 2 x 10 reps     PATIENT EDUCATION: Education details: Pt educated throughout session about proper posture and technique with exercises. Improved exercise technique, movement at target joints, use of target muscles after min to mod verbal, visual, tactile cues. Person educated: Patient Education method: Explanation Education comprehension: verbalized understanding  HOME EXERCISE PROGRAM: Access Code: [email or web address] URL: [email or web address] Date: 03/10/2023 Prepared by: Grier Rocher  Exercises - Seated Upper Trapezius Stretch  - 1 x daily - 7 x weekly - 3 sets - 3 reps - 20 hold - Seated Levator Scapulae Stretch  - 1 x daily - 7 x weekly - 3 sets - 3 reps - 20 hold - Seated Scapular Retraction  - 1 x daily - 7 x weekly - 3 sets - 10 reps - 3 hold -  Shoulder Extension with Resistance  - 1 x daily - 7 x weekly - 3 sets - 10 reps - 3 hold - Shoulder External Rotation and Scapular Retraction with Resistance  - 1 x daily - 7 x weekly - 3 sets - 10 reps - 3 hold - Seated Passive Cervical Retraction  - 1 x daily - 7 x weekly - 3 sets - 5 reps - 5  hold  ASSESSMENT:  CLINICAL IMPRESSION:  Patient arrived with good motivation form completion of pt activities.  Pt had multiple TP in her infraspinatus which were addressed with manual therapy.  Progressed with postural and scapular strength this session. Pt had some fatigue in shoulders at end of session but no increase in pain and no pain increase after last session. Pt will continue to benefit from skilled physical therapy intervention to address impairments, improve QOL, and attain therapy goals.    OBJECTIVE IMPAIRMENTS: decreased activity tolerance, decreased strength, impaired flexibility, impaired UE functional use, and pain.   ACTIVITY LIMITATIONS: carrying, lifting, and reach over head  PARTICIPATION LIMITATIONS: cleaning, laundry, community activity, and occupation  PERSONAL FACTORS: Time since onset of injury/illness/exacerbation are also affecting patient's functional outcome.   REHAB POTENTIAL: Excellent  CLINICAL DECISION MAKING: Stable/uncomplicated  EVALUATION COMPLEXITY: Low   GOALS: Goals reviewed with patient? Yes  SHORT TERM GOALS: Target date: 03/21/2023    Patient will be independent in home exercise program to improve strength/mobility for better functional independence with ADLs. Baseline: No HEP currently  Goal status: MET   LONG TERM GOALS: Target date: 04/18/2023   Pt will report resting pain levels 1/10 or less in order to indicate improvement in subjective levels of pain. 03/07/23: No pain but has not worked or done anything to exacerbate discomfort in several weeks, want to maintain this through working conditions.  Goal status: INITIAL  2.  Patient  will improve Foto score to 75 or greater in order to indicate improved subjective rating of upper extremity functional use Baseline: 72 Goal status: INITIAL   3.  Patient will improve left shoulder strength in all major movements planes including abduction, scaption, flexion, external and internal rotation to 4+ out of 5 or greater in order to indicate improved shoulder strength for functional activities Baseline: 4/5 in many L UE strength tests, see eval chart  Goal status: INITIAL  5.  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 Baseline: causing discomfort at work  Goal status: INITIAL  6.  Patient will improve QuickDASH score by 5 percentage points or better in  order to indicate improved subjective rating of upper extremity function without pain or limitations as a result of her left shoulder injury and surgery Baseline:  03/07/23:11.3 Goal status:INITIAL  PLAN:  PT FREQUENCY: 1-2x/week  PT DURATION: 6 weeks  PLANNED INTERVENTIONS: 97110-Therapeutic exercises, 97530- Therapeutic activity, 97112- Neuromuscular re-education, 97535- Self Care, 47425- Manual therapy, Taping, Dry Needling, Joint mobilization, Joint manipulation, Cryotherapy, and Moist heat  PLAN FOR NEXT SESSION:  Para scapular TP dry needling , IR and ER strengthening as tolerated, scapular and postural strengthening   Norman Herrlich, PT 03/18/2023, 8:44 AM

## 2023-03-22 ENCOUNTER — Ambulatory Visit: Payer: PRIVATE HEALTH INSURANCE | Attending: Orthopedic Surgery | Admitting: Physical Therapy

## 2023-03-22 DIAGNOSIS — M25612 Stiffness of left shoulder, not elsewhere classified: Secondary | ICD-10-CM | POA: Diagnosis present

## 2023-03-22 DIAGNOSIS — R262 Difficulty in walking, not elsewhere classified: Secondary | ICD-10-CM | POA: Insufficient documentation

## 2023-03-22 DIAGNOSIS — M6281 Muscle weakness (generalized): Secondary | ICD-10-CM | POA: Insufficient documentation

## 2023-03-22 DIAGNOSIS — M25512 Pain in left shoulder: Secondary | ICD-10-CM | POA: Diagnosis present

## 2023-03-22 NOTE — Therapy (Signed)
OUTPATIENT PHYSICAL THERAPY SHOULDER TREATMENT   Patient Name: April Graham MRN: [id number] DOB:Aug 17, 1980, 43 y.o., female Today's Date: 03/22/2023  END OF SESSION:  PT End of Session - 03/22/23 1115     Visit Number 5    Number of Visits 12    Date for PT Re-Evaluation 04/18/23    Authorization Type WC    Authorization Time Period 6 sessions until 03/25/23    Authorization - Number of Visits 6    Progress Note Due on Visit 10    PT Start Time 1104    PT Stop Time 1145    PT Time Calculation (min) 41 min    Activity Tolerance Patient tolerated treatment well    Behavior During Therapy WFL for tasks assessed/performed               Past Medical History:  Diagnosis Date   Anxiety    Arthritis    Family history of adverse reaction to anesthesia    Mother N&V   Irritable bowel syndrome (IBS)    Palpitations    PONV (postoperative nausea and vomiting)    Past Surgical History:  Procedure Laterality Date   ADENOIDECTOMY     KNEE ARTHROSCOPY     KNEE ARTHROSCOPY W/ ACL RECONSTRUCTION     TONSILLECTOMY     WISDOM TOOTH EXTRACTION     There are no active problems to display for this patient.   PCP: Pearline Cables, MD   REFERRING PROVIDER:   Jones Broom, MD    REFERRING DIAG:  [phone] (ICD-10-CM) - Left shoulder pain  M89.8X1 (ICD-10-CM) - Periscapular pain    THERAPY DIAG:  Stiffness of left shoulder, not elsewhere classified  Muscle weakness (generalized)  Acute pain of left shoulder  Difficulty in walking, not elsewhere classified  Rationale for Evaluation and Treatment: Rehabilitation  ONSET DATE: 09/13/22  SUBJECTIVE:                                                                                                                                                                                      SUBJECTIVE STATEMENT:  Pt reports that she is feeling pretty good. Mild tightness in L shoulder. No overt pain. Was able to work  yesterday, performing physically demanding tasks without significant increase in shoulder pain. Mild LBP following work yesterday.   From Eval. Pt reports she did well for a while after discharge (6 mo) then mid summer she started to have off and on pain in her left shoulder. Pt then started to have more consistent achiness in her upper trap and into the bottom of her neck. Pt got injection earlier in December around  dec 15. Since she has had little pain. Internal rotation with resistance to the shoulder will sometimes cause pain which is how she initially injured the shoulder.  Hand dominance: Right  PERTINENT HISTORY: Patient familiar to this clinic and seen 1 year prior for same concern.  Patient responded well to initial interventions but also did have to have a arthroscopic procedure working on the labrum, distal end of clavicle, and bone spurs.  PAIN:  Are you having pain? No Pain  PRECAUTIONS: None  RED FLAGS: None   WEIGHT BEARING RESTRICTIONS: No  FALLS:  Has patient fallen in last 6 months? 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   PLOF: Independent  PATIENT GOALS:Improve shoulder pain and restore to pre morbid function  NEXT MD VISIT:   OBJECTIVE:  Note: Objective measures were completed at Evaluation unless otherwise noted.  DIAGNOSTIC FINDINGS:  Unable to see most recent imaging in chart.  PATIENT SURVEYS:  Quick Dash 11.3 and FOTO 72  COGNITION: Overall cognitive status: Within functional limits for tasks assessed     SENSATION: Not tested  POSTURE: Rounded shoulders  UPPER EXTREMITY ROM:   Functional ROM WNL without pain   UPPER EXTREMITY MMT:  MMT Right eval Left eval  Shoulder flexion 5 4+   Shoulder extension    Shoulder abduction 5 4  Shoulder adduction    Shoulder internal rotation 5 4  Shoulder external rotation 5 4  Middle trapezius    Lower trapezius    Elbow flexion    Elbow extension    Wrist flexion    Wrist extension    Wrist ulnar deviation    Wrist radial deviation    Wrist pronation    Wrist supination    Grip strength (lbs)    (Blank rows = not tested)  SHOULDER SPECIAL TESTS: Impingement tests: Hawkins/Kennedy impingement test: positive  Rotator cuff assessment: Empty can test: positive  (sight discomfort) Biceps assessment: Speed's test: negative   PALPATION:  Upper trap left trapezius Tps noted                                                                                                                              TREATMENT DATE: 03/22/23  Manual:  L UT/middle trap, distal Lats and Teres minor, and infraspinatus TP release ( ischemic) x 8 min  Reports referred pain from Infraspinatus into LUE with ischemic pressure.   TE supine scapular retraction 2 x 10  Supine Chin tuck  x 10 x 3 sec hold in supine Sitting  Shoulder ER and seratus/lower trap activation with RTB 2 x 8  with 3 second hold Standing doorway pec stretch.  Prone Y x 12 with 3 sec hold Prone  W x 12 with 3 sec hold and slight trunk extension Prone Shoulder extension for lower trap activation x 15 ea Seated Shoulder ER YTB 2 x 10 reps     PATIENT EDUCATION: Education details: Pt educated throughout session about proper posture and technique with exercises. Improved exercise technique, movement at target joints, use of target muscles after min to mod verbal, visual, tactile cues. Person educated: Patient Education method: Explanation Education comprehension: verbalized understanding  HOME EXERCISE PROGRAM: Access Code: [email or web address] URL: [email or web address] Date: 03/10/2023 Prepared by: Grier Rocher  Exercises - Seated Upper Trapezius Stretch  - 1 x  daily - 7 x weekly - 3 sets - 3 reps - 20 hold - Seated Levator Scapulae Stretch  - 1 x daily - 7 x weekly - 3 sets - 3 reps - 20 hold - Seated Scapular Retraction  - 1 x daily - 7 x weekly - 3 sets - 10 reps - 3 hold - Shoulder Extension with Resistance  - 1 x daily - 7 x weekly - 3 sets - 10 reps - 3 hold - Shoulder External Rotation and Scapular Retraction with Resistance  - 1 x daily - 7 x weekly - 3 sets - 10 reps - 3 hold - Seated Passive Cervical Retraction  - 1 x daily - 7 x weekly - 3 sets - 5 reps - 5  hold  ASSESSMENT:  CLINICAL IMPRESSION:  Patient arrived with good motivation form completion of pt activities.  Continued to noted multiple TP in her infraspinatus which were addressed with manual therapy. Progressed with postural and scapular strength this session with addition of prone LowerTrap and MidTrap activation. Mild referred pain in the LUE with TP massage to the L infraspinatus with mild soreness upon completion. Educated on hydration following PT release. Pt will continue to benefit from skilled physical therapy intervention to address impairments, improve QOL, and attain therapy goals.    OBJECTIVE IMPAIRMENTS: decreased activity tolerance, decreased strength, impaired flexibility, impaired UE functional use, and pain.   ACTIVITY LIMITATIONS: carrying, lifting, and reach over head  PARTICIPATION LIMITATIONS: cleaning, laundry, community activity, and occupation  PERSONAL FACTORS: Time since onset of injury/illness/exacerbation are also affecting patient's functional outcome.   REHAB POTENTIAL: Excellent  CLINICAL DECISION MAKING: Stable/uncomplicated  EVALUATION COMPLEXITY: Low   GOALS: Goals reviewed with patient? Yes  SHORT TERM GOALS: Target date: 03/21/2023    Patient will be independent in home exercise program to improve strength/mobility for better functional independence with ADLs. Baseline: No HEP currently  Goal status: MET   LONG TERM GOALS:  Target date: 04/18/2023   Pt will report resting pain levels 1/10 or less in order to indicate improvement in subjective levels of pain. 03/07/23: No pain but has not worked or done anything to exacerbate discomfort in several weeks, want to maintain this through working conditions.  Goal status: INITIAL  2.  Patient will improve Foto score to 75 or greater in order to indicate improved subjective rating of upper extremity functional use Baseline: 72 Goal status: INITIAL   3.  Patient will improve left shoulder strength in all major movements planes including abduction, scaption, flexion, external and internal rotation to 4+ out of 5 or greater in order to indicate improved shoulder strength for functional activities Baseline: 4/5 in many L UE strength tests, see eval chart  Goal status: INITIAL  5.  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 Baseline: causing discomfort at work  Goal status: INITIAL  6.  Patient will improve QuickDASH score by 5 percentage points or better in order to indicate improved subjective rating of upper extremity function without pain or limitations as a result of her left shoulder injury and surgery Baseline:  03/07/23:11.3 Goal status:INITIAL  PLAN:  PT FREQUENCY: 1-2x/week  PT DURATION: 6 weeks  PLANNED INTERVENTIONS: 97110-Therapeutic exercises, 97530- Therapeutic activity, 97112- Neuromuscular re-education, 97535- Self Care, [id number]- Manual therapy, Taping, Dry Needling, Joint mobilization, Joint manipulation, Cryotherapy, and Moist heat  PLAN FOR NEXT SESSION:   Continue Para scapular TP dry needling , IR and ER strengthening as tolerated, scapular and postural strengthening   Golden Pop, PT 03/22/2023, 11:15 AM

## 2023-03-24 ENCOUNTER — Ambulatory Visit: Payer: PRIVATE HEALTH INSURANCE | Admitting: Physical Therapy

## 2023-03-24 DIAGNOSIS — M25612 Stiffness of left shoulder, not elsewhere classified: Secondary | ICD-10-CM

## 2023-03-24 DIAGNOSIS — M6281 Muscle weakness (generalized): Secondary | ICD-10-CM

## 2023-03-24 DIAGNOSIS — M25512 Pain in left shoulder: Secondary | ICD-10-CM

## 2023-03-24 DIAGNOSIS — R262 Difficulty in walking, not elsewhere classified: Secondary | ICD-10-CM

## 2023-03-24 NOTE — Therapy (Signed)
OUTPATIENT PHYSICAL THERAPY SHOULDER TREATMENT   Patient Name: April Graham MRN: [id number] DOB:September 04, 1980, 43 y.o., female Today's Date: 03/24/2023  END OF SESSION:  PT End of Session - 03/24/23 1021     Visit Number 6    Number of Visits 12    Date for PT Re-Evaluation 04/18/23    Authorization Type WC    Authorization Time Period 6 sessions until 03/25/23    Authorization - Number of Visits 6    Progress Note Due on Visit 10    PT Start Time 1019    PT Stop Time 1100    PT Time Calculation (min) 41 min    Activity Tolerance Patient tolerated treatment well    Behavior During Therapy WFL for tasks assessed/performed               Past Medical History:  Diagnosis Date   Anxiety    Arthritis    Family history of adverse reaction to anesthesia    Mother N&V   Irritable bowel syndrome (IBS)    Palpitations    PONV (postoperative nausea and vomiting)    Past Surgical History:  Procedure Laterality Date   ADENOIDECTOMY     KNEE ARTHROSCOPY     KNEE ARTHROSCOPY W/ ACL RECONSTRUCTION     TONSILLECTOMY     WISDOM TOOTH EXTRACTION     There are no active problems to display for this patient.   PCP: Pearline Cables, MD   REFERRING PROVIDER:   Jones Broom, MD    REFERRING DIAG:  [phone] (ICD-10-CM) - Left shoulder pain  M89.8X1 (ICD-10-CM) - Periscapular pain    THERAPY DIAG:  Stiffness of left shoulder, not elsewhere classified  Muscle weakness (generalized)  Acute pain of left shoulder  Difficulty in walking, not elsewhere classified  Rationale for Evaluation and Treatment: Rehabilitation  ONSET DATE: 09/13/22  SUBJECTIVE:                                                                                                                                                                                      SUBJECTIVE STATEMENT:  Pt reports that she is feeling much better. No pain with movement at home or work. Mild tenderness in     From  Eval. Pt reports she did well for a while after discharge (6 mo) then mid summer she started to have off and on pain in her left shoulder. Pt then started to have more consistent achiness in her upper trap and into the bottom of her neck. Pt got injection earlier in December around dec 15. Since she has had little pain. Internal rotation with resistance to the shoulder  will sometimes cause pain which is how she initially injured the shoulder.  Hand dominance: Right  PERTINENT HISTORY: Patient familiar to this clinic and seen 1 year prior for same concern.  Patient responded well to initial interventions but also did have to have a arthroscopic procedure working on the labrum, distal end of clavicle, and bone spurs.  PAIN:  Are you having pain? No Pain  PRECAUTIONS: None  RED FLAGS: None   WEIGHT BEARING RESTRICTIONS: No  FALLS:  Has patient fallen in last 6 months? 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   PLOF: Independent  PATIENT GOALS:Improve shoulder pain and restore to pre morbid function  NEXT MD VISIT:   OBJECTIVE:  Note: Objective measures were completed at Evaluation unless otherwise noted.  DIAGNOSTIC FINDINGS:  Unable to see most recent imaging in chart.  PATIENT SURVEYS:  Quick Dash 11.3 and FOTO 72  COGNITION: Overall cognitive status: Within functional limits for tasks assessed     SENSATION: Not tested  POSTURE: Rounded shoulders  UPPER EXTREMITY ROM:   Functional ROM WNL without pain   UPPER EXTREMITY MMT:  MMT Right eval Left eval R 1/23 L 1/23  Shoulder flexion 5 4+ 5 5  Shoulder extension   5 5  Shoulder abduction 5 4 5 5   Shoulder adduction      Shoulder internal rotation 5 4 5 5   Shoulder  external rotation 5 4 5 5   Middle trapezius      Lower trapezius      Elbow flexion      Elbow extension      Wrist flexion      Wrist extension      Wrist ulnar deviation      Wrist radial deviation      Wrist pronation      Wrist supination      Grip strength (lbs)      (Blank rows = not tested)  SHOULDER SPECIAL TESTS: Impingement tests: Hawkins/Kennedy impingement test: positive  Rotator cuff assessment: Empty can test: positive  (sight discomfort) Biceps assessment: Speed's test: negative   PALPATION:  Upper trap left trapezius Tps noted                                                                                                                              TREATMENT DATE: 03/24/23  PT instructed pt in goal assessment. See below for details.   Manual:  L infraspinatus TP release x 5 min .  Trigger Point Dry Needling  Subsequent Treatment: Instructions provided previously at initial dry needling treatment.   Patient Verbal Consent Given: Yes Education Handout Provided: Yes Muscles Treated:Infraspinatus  Electrical Stimulation Performed: No Treatment Response/Outcome: reduced pain in  the L shoulder to pressure     TE Scapular session with PVC to "break" bar x 5  Standing ER 2.5# x 10 bil  Standing IR 2.5# x 10 bil  2KG alphabet A-X on wall.  2KG ball tap on top of rolling mirror to simulate hanging IV bag x10  Kettle ball slide in bed on sheet to simulate pt scoot in bed 30# x 4 bil    PATIENT EDUCATION: Education details: Pt educated throughout session about proper posture and technique with exercises. Improved exercise technique, movement at target joints, use of target muscles after min to mod verbal, visual, tactile cues. Person educated: Patient Education method: Explanation Education comprehension: verbalized understanding  HOME EXERCISE PROGRAM: Access Code: [email or web address] URL: [email or web address] Date: 03/10/2023 Prepared by: Grier Rocher  Exercises - Seated Upper Trapezius Stretch  - 1 x daily - 7 x weekly - 3 sets - 3 reps - 20 hold - Seated Levator Scapulae Stretch  - 1 x daily - 7 x weekly - 3 sets - 3 reps - 20 hold - Seated Scapular Retraction  - 1 x daily - 7 x weekly - 3 sets - 10 reps - 3 hold - Shoulder Extension with Resistance  - 1 x daily - 7 x weekly - 3 sets - 10 reps - 3 hold - Shoulder External Rotation and Scapular Retraction with Resistance  - 1 x daily - 7 x weekly - 3 sets - 10 reps - 3 hold - Seated Passive Cervical Retraction  - 1 x daily - 7 x weekly - 3 sets - 5 reps - 5  hold  ASSESSMENT:  CLINICAL IMPRESSION:  Patient arrived with good motivation form completion of pt activities. Completed goal assessment. Pt noted to have improved function, reduced pain and increased strength allowing improved safety and independence with work tasks. Due to improved strength, reduced pain and improved function, pt will no longer require skilled PT to address deficits, unless there is a change in status and injury exacerbation.    OBJECTIVE IMPAIRMENTS: decreased activity tolerance, decreased strength, impaired flexibility, impaired UE functional use, and pain.   ACTIVITY LIMITATIONS: carrying, lifting, and reach over head  PARTICIPATION LIMITATIONS: cleaning, laundry, community activity, and occupation  PERSONAL FACTORS: Time since onset of injury/illness/exacerbation are also affecting patient's functional outcome.   REHAB POTENTIAL: Excellent  CLINICAL DECISION MAKING: Stable/uncomplicated  EVALUATION COMPLEXITY: Low   GOALS: Goals reviewed with patient? Yes  SHORT TERM GOALS: Target date: 03/21/2023    Patient will be independent in home exercise program to improve strength/mobility for better functional independence with ADLs. Baseline: No HEP currently  Goal status: MET   LONG TERM GOALS: Target date: 04/18/2023   Pt will report resting pain levels 1/10 or less in order to indicate  improvement in subjective levels of pain. 03/07/23: No pain but has not worked or done anything to exacerbate discomfort in several weeks, want to maintain this through working conditions.  1/23: no pain at work. Mild soreness with palpation.  Goal status: MET   2.  Patient will improve Foto score to 75 or greater in order to indicate improved subjective rating of upper extremity functional use Baseline: 72 1/23: 83 Goal status: MET   3.  Patient will improve left shoulder strength in all major movements planes including abduction, scaption, flexion, external and internal rotation to 4+ out of 5 or greater in order to indicate improved shoulder strength for functional activities Baseline: 4/5 in many L UE strength  tests, see eval chart  Goal status: MET  5.  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 Baseline: causing discomfort at work  1/23: able to demonstrate pt movement in bed, liftin g IV bags and moving bed without pain in the shoulder.  Goal status: MET  6.  Patient will improve QuickDASH score by 5 percentage points or better in order to indicate improved subjective rating of upper extremity function without pain or limitations as a result of her left shoulder injury and surgery Baseline:  03/07/23:11.3  1/23: 6.8  Goal status: MET   PLAN:  PT FREQUENCY: 1-2x/week  PT DURATION: 6 weeks  PLANNED INTERVENTIONS: 97110-Therapeutic exercises, 97530- Therapeutic activity, 97112- Neuromuscular re-education, 97535- Self Care, 54627- Manual therapy, Taping, Dry Needling, Joint mobilization, Joint manipulation, Cryotherapy, and Moist heat  PLAN FOR NEXT SESSION:     Golden Pop, PT 03/24/2023, 10:21 AM

## 2023-09-07 ENCOUNTER — Other Ambulatory Visit: Payer: Self-pay | Admitting: Family Medicine

## 2023-09-07 DIAGNOSIS — Z3041 Encounter for surveillance of contraceptive pills: Secondary | ICD-10-CM

## 2023-09-17 NOTE — Patient Instructions (Incomplete)
 Good to see you today- I will be in touch with your labs asap Please set up your mammo when you can Best wishes on your engagement!  So exciting!

## 2023-09-17 NOTE — Progress Notes (Unsigned)
 [facility] [address] [place], [place] [address] [phone]  Date:  09/22/2023   Name:  LAMYAH CREED   DOB:  Dec 30, 1980   MRN:  [id number]  PCP:  Watt Harlene BROCKS, MD    Chief Complaint: No chief complaint on file.   History of Present Illness:  NEIVA MAENZA is a 43 y.o. 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   Past Medical History:  Diagnosis Date   Anxiety    Arthritis    Family history of adverse reaction to anesthesia    Mother N&V   Irritable bowel syndrome (IBS)    Palpitations    PONV (postoperative nausea and vomiting)     Past Surgical History:  Procedure Laterality Date   ADENOIDECTOMY     KNEE ARTHROSCOPY     KNEE ARTHROSCOPY W/ ACL RECONSTRUCTION     TONSILLECTOMY     WISDOM TOOTH EXTRACTION      Social History   Tobacco Use   Smoking status: Never   Smokeless tobacco: Never  Vaping Use   Vaping status: Never Used  Substance Use Topics   Alcohol use: Yes    Comment: social   Drug use: Never    Family History  Problem Relation Age of Onset   Arthritis Mother    Depression Mother    Diabetes Mother    High Cholesterol Mother    Transient ischemic attack Mother    Arthritis Father    Cancer Father    COPD Father    Diabetes Father    Hypertension Father    Cancer Maternal Grandfather    Cancer Paternal Grandmother     No Known Allergies  Medication list has been reviewed and updated.  Current Outpatient Medications on File Prior to Visit  Medication  Sig Dispense Refill   [employer] MAGNESIUM PO Take by mouth.     ibuprofen (ADVIL) 200 MG tablet Take 800 mg by mouth every 6 (six) hours as needed for mild pain or moderate pain.     LORazepam  (ATIVAN ) 1 MG tablet Take 0.5 tablets (0.5 mg total) by mouth as needed. May take twice daily for occasional anxiety 30 tablet 0   Norgestimate-Ethinyl Estradiol Triphasic (TRI-ESTARYLLA) 0.18/0.215/0.25 MG-35 MCG tablet Take 1 tablet by mouth daily. 28 tablet 0   Probiotic Product (PROBIOTIC DAILY PO) Take by mouth.     propranolol  (INDERAL ) 10 MG tablet Take 1 tablet (10 mg total) by mouth 3 (three) times daily as needed (tachycardia). 90 tablet 3   tirzepatide  (ZEPBOUND ) 10 MG/0.5ML Pen Inject 10 mg into the skin once a week. 6 mL 2   Vitamin D -Vitamin K (D3 + K2 PO) Take by mouth.     No current facility-administered medications on file prior to visit.    Review of Systems:  As per HPI- otherwise negative.   Physical Examination: There were no vitals filed for this visit. There were no vitals filed for this visit. There  is no height or weight on file to calculate BMI. Ideal Body Weight:    GEN: no acute distress. HEENT: Atraumatic, Normocephalic.  Ears and Nose: No external deformity. CV: RRR, No M/G/R. No JVD. No thrill. No extra heart sounds. PULM: CTA B, no wheezes, crackles, rhonchi. No retractions. No resp. distress. No accessory muscle use. ABD: S, NT, ND, +BS. No rebound. No HSM. EXTR: No c/c/e PSYCH: Normally interactive. Conversant.    Assessment and Plan: *** Physical exam today- encouraged healthy diet and exercise routine Will plan further follow- up pending labs.  Signed Harlene Schroeder, MD

## 2023-09-22 ENCOUNTER — Ambulatory Visit (INDEPENDENT_AMBULATORY_CARE_PROVIDER_SITE_OTHER): Admitting: Family Medicine

## 2023-09-22 ENCOUNTER — Encounter: Payer: Self-pay | Admitting: Family Medicine

## 2023-09-22 ENCOUNTER — Other Ambulatory Visit (HOSPITAL_COMMUNITY)
Admission: RE | Admit: 2023-09-22 | Discharge: 2023-09-22 | Disposition: A | Discharge status: Home / Self Care | Source: Ambulatory Visit | Attending: Family Medicine | Admitting: Family Medicine

## 2023-09-22 VITALS — BP 110/62 | HR 74 | Temp 98.3°F | Ht 72.0 in | Wt 205.2 lb

## 2023-09-22 DIAGNOSIS — Z131 Encounter for screening for diabetes mellitus: Secondary | ICD-10-CM | POA: Diagnosis not present

## 2023-09-22 DIAGNOSIS — Z124 Encounter for screening for malignant neoplasm of cervix: Secondary | ICD-10-CM | POA: Diagnosis not present

## 2023-09-22 DIAGNOSIS — Z1329 Encounter for screening for other suspected endocrine disorder: Secondary | ICD-10-CM | POA: Diagnosis not present

## 2023-09-22 DIAGNOSIS — Z13220 Encounter for screening for lipoid disorders: Secondary | ICD-10-CM | POA: Diagnosis not present

## 2023-09-22 DIAGNOSIS — Z0000 Encounter for general adult medical examination without abnormal findings: Secondary | ICD-10-CM

## 2023-09-22 DIAGNOSIS — Z1231 Encounter for screening mammogram for malignant neoplasm of breast: Secondary | ICD-10-CM

## 2023-09-22 DIAGNOSIS — Z3041 Encounter for surveillance of contraceptive pills: Secondary | ICD-10-CM

## 2023-09-22 DIAGNOSIS — Z130 Encounter for screening for diseases of the blood and blood-forming organs and certain disorders involving the immune mechanism: Secondary | ICD-10-CM

## 2023-09-22 LAB — TSH: TSH: 0.88 u[IU]/mL (ref 0.35–5.50)

## 2023-09-22 LAB — COMPREHENSIVE METABOLIC PANEL WITH GFR
ALT: 14 U/L (ref 0–35)
AST: 14 U/L (ref 0–37)
Albumin: 4.2 g/dL (ref 3.5–5.2)
Alkaline Phosphatase: 44 U/L (ref 39–117)
BUN: 17 mg/dL (ref 6–23)
CO2: 28 meq/L (ref 19–32)
Calcium: 9.1 mg/dL (ref 8.4–10.5)
Chloride: 107 meq/L (ref 96–112)
Creatinine, Ser: 0.77 mg/dL (ref 0.40–1.20)
GFR: 93.8 mL/min (ref >60.00)
Glucose, Bld: 75 mg/dL (ref 70–99)
Potassium: 4.8 meq/L (ref 3.5–5.1)
Sodium: 140 meq/L (ref 135–145)
Total Bilirubin: 0.4 mg/dL (ref 0.2–1.2)
Total Protein: 6.4 g/dL (ref 6.0–8.3)

## 2023-09-22 LAB — CBC
HCT: 39.4 % (ref 36.0–46.0)
Hemoglobin: 13 g/dL (ref 12.0–15.0)
MCHC: 32.9 g/dL (ref 30.0–36.0)
MCV: 94.4 fl (ref 78.0–100.0)
Platelets: 248 K/uL (ref 150.0–400.0)
RBC: 4.17 Mil/uL (ref 3.87–5.11)
RDW: 12.8 % (ref 11.5–15.5)
WBC: 7 K/uL (ref 4.0–10.5)

## 2023-09-22 LAB — LIPID PANEL
Cholesterol: 126 mg/dL (ref 0–200)
HDL: 57 mg/dL (ref >39.00)
LDL Cholesterol: 53 mg/dL (ref 0–99)
NonHDL: 68.78
Total CHOL/HDL Ratio: 2
Triglycerides: 77 mg/dL (ref 0.0–149.0)
VLDL: 15.4 mg/dL (ref 0.0–40.0)

## 2023-09-22 LAB — HEMOGLOBIN A1C: Hgb A1c MFr Bld: 5.1 % (ref 4.6–6.5)

## 2023-09-22 MED ORDER — NORGESTIM-ETH ESTRAD TRIPHASIC 0.18/0.215/0.25 MG-35 MCG PO TABS: 1.0000 | ORAL_TABLET | Freq: Every day | ORAL | 3 refills | Status: AC

## 2023-09-27 ENCOUNTER — Ambulatory Visit: Payer: Self-pay | Admitting: Family

## 2023-09-27 LAB — CYTOLOGY - PAP
Comment: NEGATIVE
Diagnosis: NEGATIVE
High risk HPV: NEGATIVE

## 2023-09-27 IMAGING — DX DG CHEST 2V
2 series · 2 of 2 positions shown · non-contrast
Comparison: None

CLINICAL DATA: Atypical chest pain

EXAM:
CHEST - 2 VIEW

[chest pa]
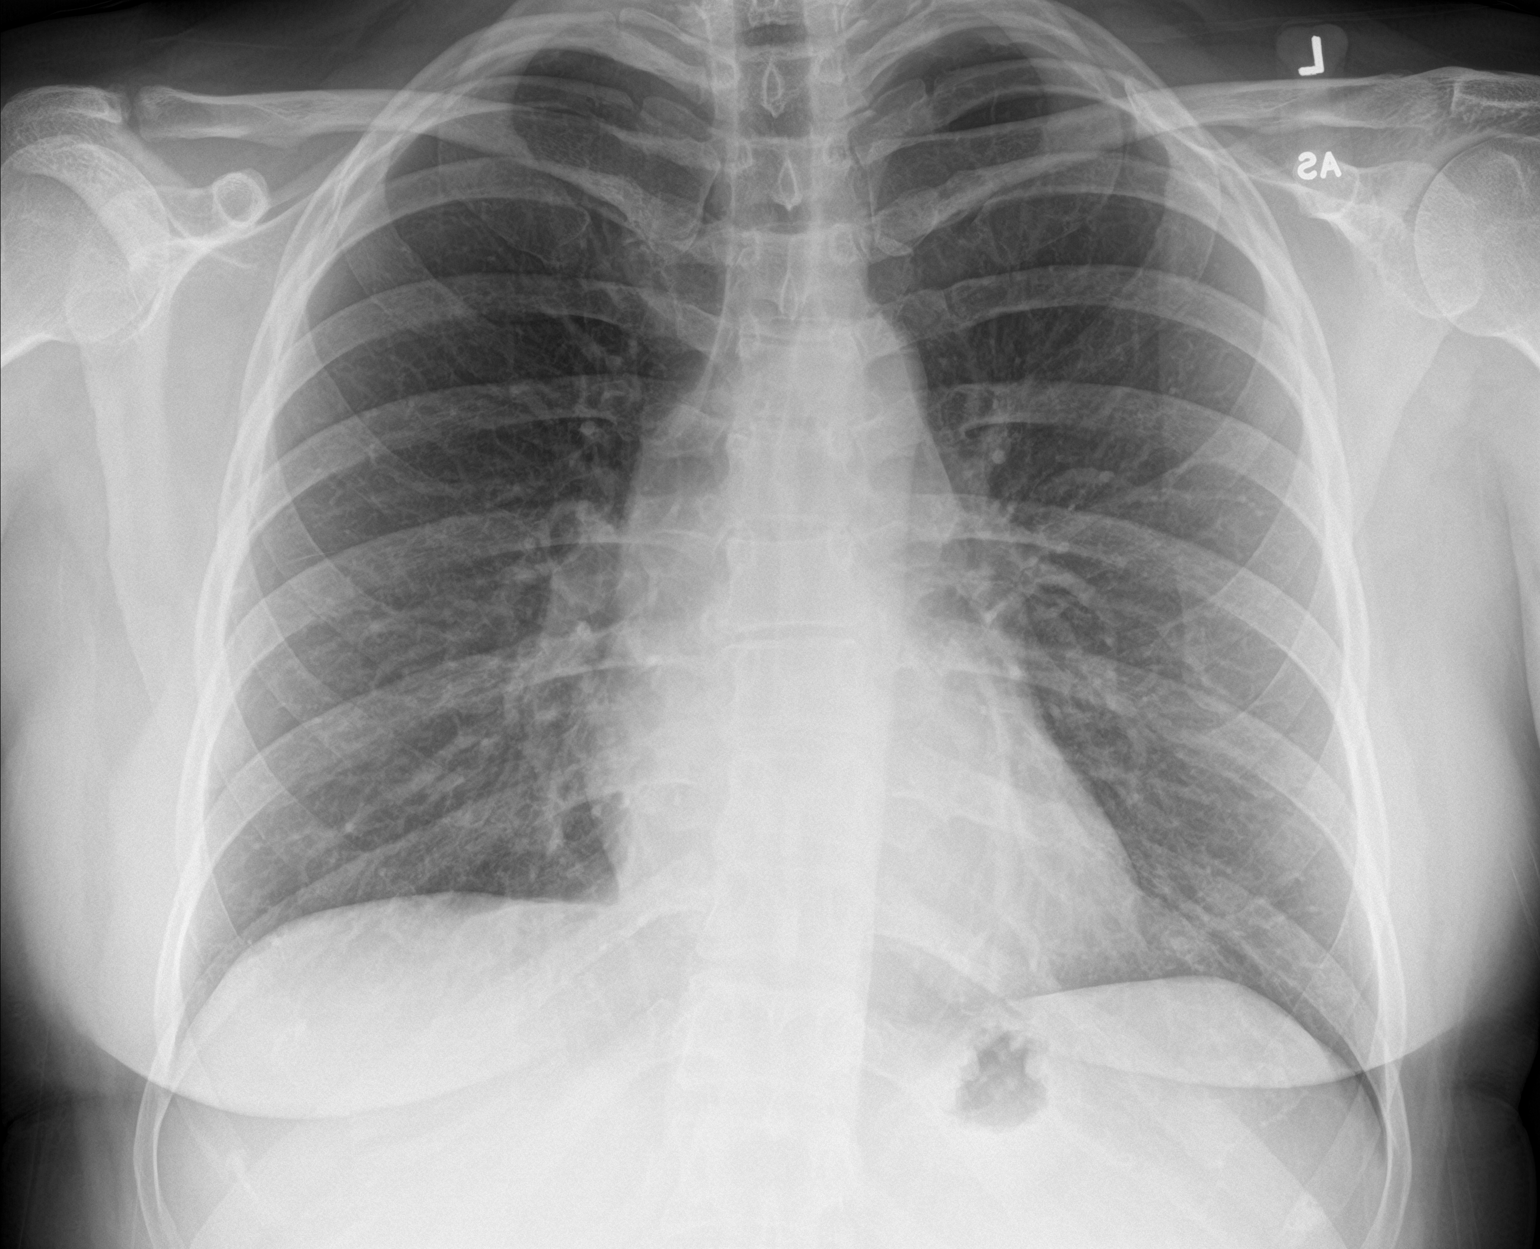

[chest lat]
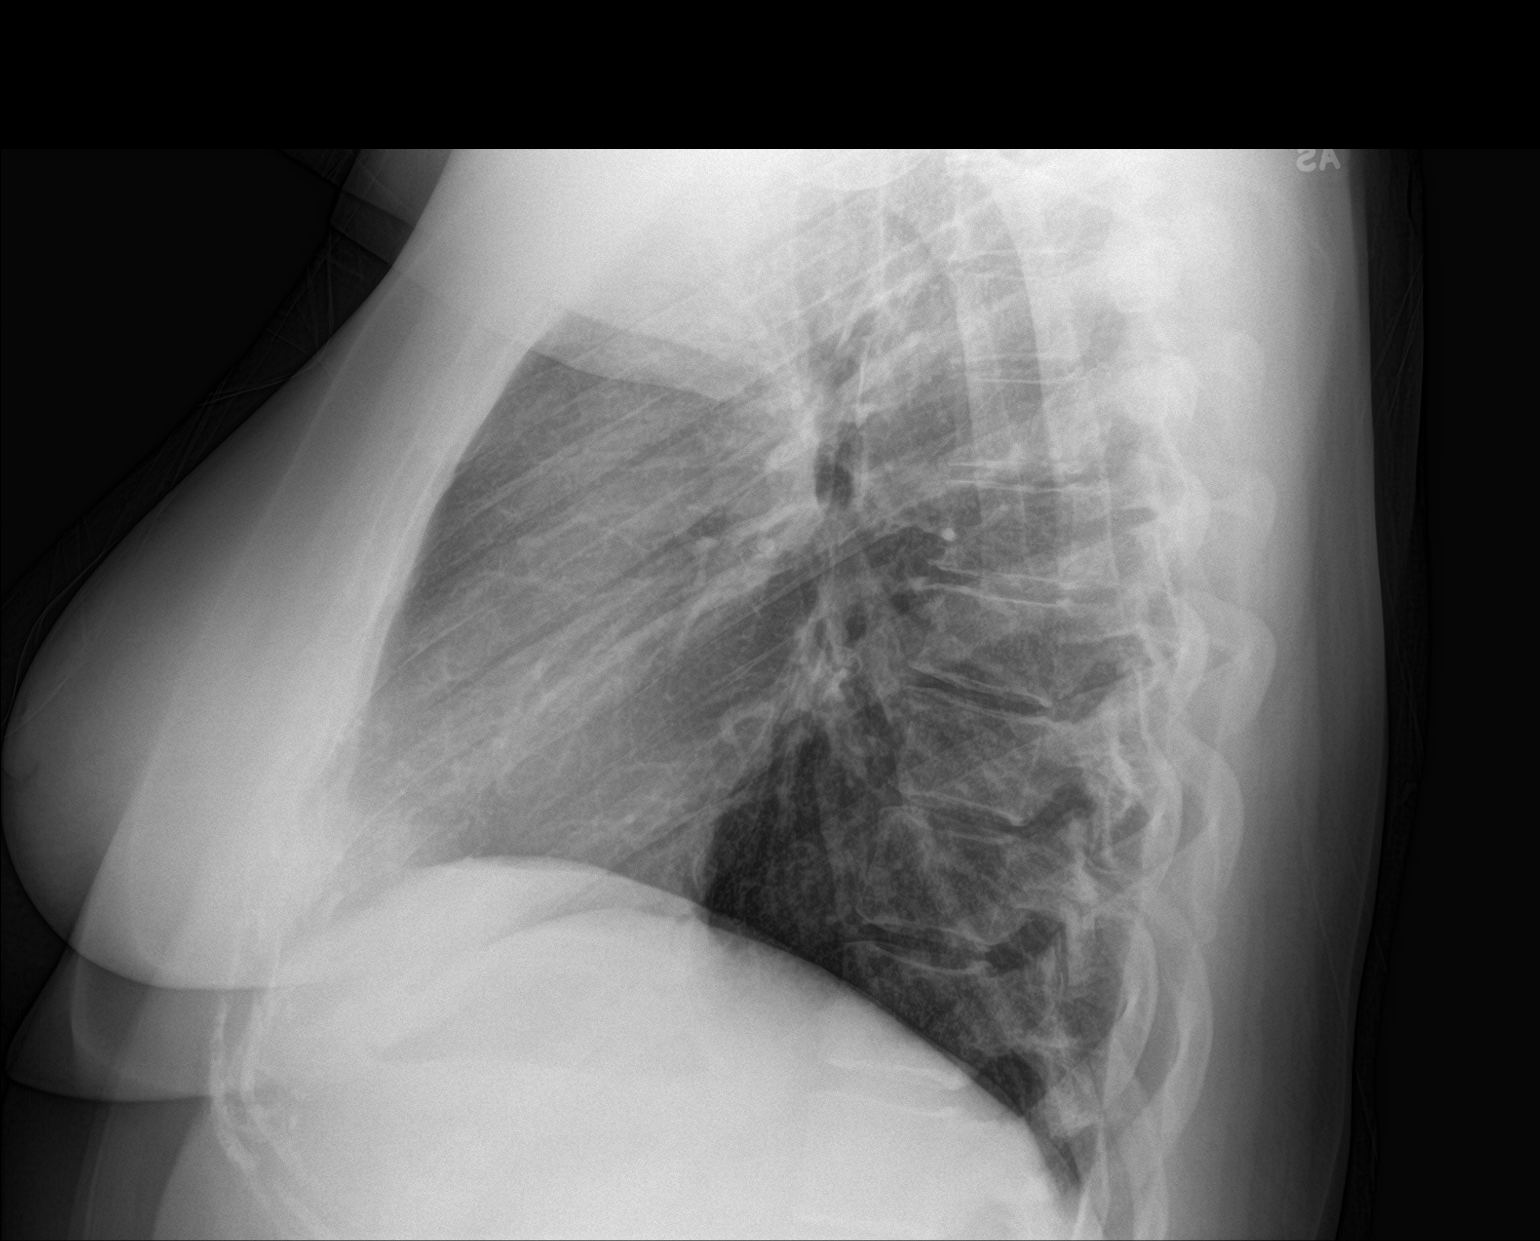

[2 of 2 positions shown; findings below may reference images not displayed]

FINDINGS: The heart size and mediastinal contours are within normal limits.
Both lungs are clear. The visualized skeletal structures are
unremarkable.
IMPRESSION: No active cardiopulmonary disease.

## 2023-09-28 ENCOUNTER — Encounter: Payer: Self-pay | Admitting: Family Medicine

## 2023-11-14 ENCOUNTER — Encounter: Payer: Self-pay | Admitting: Family Medicine

## 2023-11-14 DIAGNOSIS — N898 Other specified noninflammatory disorders of vagina: Secondary | ICD-10-CM

## 2023-11-14 MED ORDER — METRONIDAZOLE 500 MG PO TABS: 500.0000 mg | ORAL_TABLET | Freq: Two times a day (BID) | ORAL | 0 refills | Status: AC

## 2023-11-24 DIAGNOSIS — M1711 Unilateral primary osteoarthritis, right knee: Secondary | ICD-10-CM | POA: Diagnosis not present

## 2024-01-17 DIAGNOSIS — M25562 Pain in left knee: Secondary | ICD-10-CM | POA: Diagnosis not present

## 2024-01-17 DIAGNOSIS — M1711 Unilateral primary osteoarthritis, right knee: Secondary | ICD-10-CM | POA: Diagnosis not present

## 2024-01-18 ENCOUNTER — Encounter: Payer: Self-pay | Admitting: Family Medicine

## 2024-01-18 DIAGNOSIS — R000 Tachycardia, unspecified: Secondary | ICD-10-CM

## 2024-01-18 MED ORDER — ESCITALOPRAM OXALATE 10 MG PO TABS: 10.0000 mg | ORAL_TABLET | Freq: Every day | ORAL | 3 refills | Status: AC

## 2024-01-18 MED ORDER — PROPRANOLOL HCL 10 MG PO TABS: 10.0000 mg | ORAL_TABLET | Freq: Three times a day (TID) | ORAL | 3 refills | Status: AC | PRN

## 2024-04-04 ENCOUNTER — Encounter: Payer: Self-pay | Admitting: Family Medicine
